# Patient Record
Sex: Male | Born: 1935
Health system: Southern US, Community
[De-identification: ages and names within clinical notes are randomized; demographics above are authoritative.]

## PROBLEM LIST (undated history)

## (undated) DIAGNOSIS — E785 Hyperlipidemia, unspecified: Secondary | ICD-10-CM

## (undated) DIAGNOSIS — H3320 Serous retinal detachment, unspecified eye: Secondary | ICD-10-CM

## (undated) DIAGNOSIS — J61 Pneumoconiosis due to asbestos and other mineral fibers: Secondary | ICD-10-CM

## (undated) DIAGNOSIS — I10 Essential (primary) hypertension: Secondary | ICD-10-CM

## (undated) DIAGNOSIS — T4145XA Adverse effect of unspecified anesthetic, initial encounter: Secondary | ICD-10-CM

## (undated) DIAGNOSIS — T8859XA Other complications of anesthesia, initial encounter: Secondary | ICD-10-CM

## (undated) DIAGNOSIS — I739 Peripheral vascular disease, unspecified: Secondary | ICD-10-CM

## (undated) DIAGNOSIS — I639 Cerebral infarction, unspecified: Secondary | ICD-10-CM

## (undated) DIAGNOSIS — R918 Other nonspecific abnormal finding of lung field: Secondary | ICD-10-CM

## (undated) DIAGNOSIS — D6851 Activated protein C resistance: Secondary | ICD-10-CM

## (undated) DIAGNOSIS — K922 Gastrointestinal hemorrhage, unspecified: Secondary | ICD-10-CM

## (undated) DIAGNOSIS — Z85118 Personal history of other malignant neoplasm of bronchus and lung: Secondary | ICD-10-CM

## (undated) DIAGNOSIS — Z951 Presence of aortocoronary bypass graft: Secondary | ICD-10-CM

## (undated) DIAGNOSIS — E039 Hypothyroidism, unspecified: Secondary | ICD-10-CM

## (undated) DIAGNOSIS — IMO0001 Reserved for inherently not codable concepts without codable children: Secondary | ICD-10-CM

## (undated) DIAGNOSIS — I69391 Dysphagia following cerebral infarction: Secondary | ICD-10-CM

## (undated) DIAGNOSIS — I779 Disorder of arteries and arterioles, unspecified: Secondary | ICD-10-CM

## (undated) DIAGNOSIS — J45909 Unspecified asthma, uncomplicated: Secondary | ICD-10-CM

## (undated) DIAGNOSIS — I2510 Atherosclerotic heart disease of native coronary artery without angina pectoris: Secondary | ICD-10-CM

## (undated) DIAGNOSIS — IMO0002 Reserved for concepts with insufficient information to code with codable children: Secondary | ICD-10-CM

## (undated) DIAGNOSIS — H269 Unspecified cataract: Secondary | ICD-10-CM

## (undated) DIAGNOSIS — J439 Emphysema, unspecified: Secondary | ICD-10-CM

## (undated) DIAGNOSIS — C3490 Malignant neoplasm of unspecified part of unspecified bronchus or lung: Secondary | ICD-10-CM

## (undated) HISTORY — DX: Pneumoconiosis due to asbestos and other mineral fibers: J61

## (undated) HISTORY — DX: Personal history of other malignant neoplasm of bronchus and lung: Z85.118

## (undated) HISTORY — DX: Peripheral vascular disease, unspecified: I73.9

## (undated) HISTORY — DX: Reserved for inherently not codable concepts without codable children: IMO0001

## (undated) HISTORY — DX: Atherosclerotic heart disease of native coronary artery without angina pectoris: I25.10

## (undated) HISTORY — PX: HEMORRHOID SURGERY: SHX153

## (undated) HISTORY — DX: Other nonspecific abnormal finding of lung field: R91.8

## (undated) HISTORY — PX: APPENDECTOMY: SHX54

## (undated) HISTORY — DX: Malignant neoplasm of unspecified part of unspecified bronchus or lung: C34.90

## (undated) HISTORY — DX: Essential (primary) hypertension: I10

## (undated) HISTORY — DX: Unspecified cataract: H26.9

## (undated) HISTORY — DX: Emphysema, unspecified: J43.9

## (undated) HISTORY — PX: FRACTURE SURGERY: SHX138

## (undated) HISTORY — DX: Disorder of arteries and arterioles, unspecified: I77.9

## (undated) HISTORY — DX: Dysphagia following cerebral infarction: I69.391

## (undated) HISTORY — DX: Cerebral infarction, unspecified: I63.9

## (undated) HISTORY — DX: Hyperlipidemia, unspecified: E78.5

## (undated) HISTORY — PX: PROSTATE SURGERY: SHX751

## (undated) HISTORY — DX: Serous retinal detachment, unspecified eye: H33.20

## (undated) HISTORY — DX: Reserved for concepts with insufficient information to code with codable children: IMO0002

---

## 1997-05-14 DIAGNOSIS — I2510 Atherosclerotic heart disease of native coronary artery without angina pectoris: Secondary | ICD-10-CM

## 1997-05-14 HISTORY — PX: PERCUTANEOUS CORONARY STENT INTERVENTION (PCI-S): SHX6016

## 1997-05-14 HISTORY — DX: Atherosclerotic heart disease of native coronary artery without angina pectoris: I25.10

## 1997-07-28 ENCOUNTER — Encounter (HOSPITAL_COMMUNITY): Admission: RE | Admit: 1997-07-28 | Discharge: 1997-10-26 | Payer: Self-pay | Admitting: *Deleted

## 1997-10-13 ENCOUNTER — Inpatient Hospital Stay (HOSPITAL_COMMUNITY): Admission: AD | Admit: 1997-10-13 | Discharge: 1997-10-14 | Payer: Self-pay | Admitting: Cardiology

## 1998-09-20 ENCOUNTER — Encounter: Payer: Self-pay | Admitting: Cardiology

## 1998-09-20 ENCOUNTER — Observation Stay (HOSPITAL_COMMUNITY): Admission: AD | Admit: 1998-09-20 | Discharge: 1998-09-21 | Payer: Self-pay | Admitting: Cardiology

## 1999-05-15 DIAGNOSIS — C3490 Malignant neoplasm of unspecified part of unspecified bronchus or lung: Secondary | ICD-10-CM

## 1999-05-15 HISTORY — DX: Malignant neoplasm of unspecified part of unspecified bronchus or lung: C34.90

## 1999-05-25 ENCOUNTER — Ambulatory Visit: Admission: RE | Admit: 1999-05-25 | Discharge: 1999-05-25 | Payer: Self-pay | Admitting: Pulmonary Disease

## 1999-06-08 ENCOUNTER — Encounter: Payer: Self-pay | Admitting: Thoracic Surgery

## 1999-06-09 ENCOUNTER — Encounter: Payer: Self-pay | Admitting: Thoracic Surgery

## 1999-06-09 ENCOUNTER — Encounter (INDEPENDENT_AMBULATORY_CARE_PROVIDER_SITE_OTHER): Payer: Self-pay | Admitting: *Deleted

## 1999-06-09 ENCOUNTER — Inpatient Hospital Stay (HOSPITAL_COMMUNITY): Admission: RE | Admit: 1999-06-09 | Discharge: 1999-06-16 | Payer: Self-pay | Admitting: Thoracic Surgery

## 1999-06-09 DIAGNOSIS — Z85118 Personal history of other malignant neoplasm of bronchus and lung: Secondary | ICD-10-CM

## 1999-06-09 HISTORY — DX: Personal history of other malignant neoplasm of bronchus and lung: Z85.118

## 1999-06-09 HISTORY — PX: LOBECTOMY: SHX5089

## 1999-06-10 ENCOUNTER — Encounter: Payer: Self-pay | Admitting: Thoracic Surgery

## 1999-06-11 ENCOUNTER — Encounter: Payer: Self-pay | Admitting: Thoracic Surgery

## 1999-06-12 ENCOUNTER — Encounter: Payer: Self-pay | Admitting: Thoracic Surgery

## 1999-06-13 ENCOUNTER — Encounter: Payer: Self-pay | Admitting: Thoracic Surgery

## 1999-06-14 ENCOUNTER — Encounter: Payer: Self-pay | Admitting: Thoracic Surgery

## 1999-06-15 ENCOUNTER — Encounter: Payer: Self-pay | Admitting: Thoracic Surgery

## 1999-06-16 ENCOUNTER — Encounter: Payer: Self-pay | Admitting: Thoracic Surgery

## 1999-06-20 ENCOUNTER — Encounter: Payer: Self-pay | Admitting: Thoracic Surgery

## 1999-06-20 ENCOUNTER — Encounter: Admission: RE | Admit: 1999-06-20 | Discharge: 1999-06-20 | Payer: Self-pay | Admitting: Thoracic Surgery

## 1999-06-27 ENCOUNTER — Encounter: Payer: Self-pay | Admitting: Thoracic Surgery

## 1999-06-27 ENCOUNTER — Encounter: Admission: RE | Admit: 1999-06-27 | Discharge: 1999-06-27 | Payer: Self-pay | Admitting: Thoracic Surgery

## 1999-06-30 ENCOUNTER — Encounter: Payer: Self-pay | Admitting: Thoracic Surgery

## 1999-06-30 ENCOUNTER — Encounter: Admission: RE | Admit: 1999-06-30 | Discharge: 1999-06-30 | Payer: Self-pay | Admitting: Thoracic Surgery

## 1999-07-02 ENCOUNTER — Inpatient Hospital Stay (HOSPITAL_COMMUNITY): Admission: AD | Admit: 1999-07-02 | Discharge: 1999-07-20 | Payer: Self-pay | Admitting: Cardiothoracic Surgery

## 1999-07-03 ENCOUNTER — Encounter: Payer: Self-pay | Admitting: Cardiothoracic Surgery

## 1999-07-04 ENCOUNTER — Encounter: Payer: Self-pay | Admitting: Thoracic Surgery

## 1999-07-05 ENCOUNTER — Encounter: Payer: Self-pay | Admitting: Thoracic Surgery

## 1999-07-06 ENCOUNTER — Inpatient Hospital Stay
Admission: RE | Admit: 1999-07-06 | Discharge: 1999-07-06 | Payer: Self-pay | Admitting: Physical Medicine & Rehabilitation

## 1999-07-07 ENCOUNTER — Encounter: Payer: Self-pay | Admitting: Thoracic Surgery

## 1999-07-09 ENCOUNTER — Encounter: Payer: Self-pay | Admitting: Thoracic Surgery

## 1999-07-11 ENCOUNTER — Encounter: Payer: Self-pay | Admitting: Thoracic Surgery

## 1999-07-13 ENCOUNTER — Encounter: Payer: Self-pay | Admitting: Thoracic Surgery

## 1999-07-13 HISTORY — PX: VIDEO ASSISTED THORACOSCOPY (VATS)/EMPYEMA: SHX6172

## 1999-07-14 ENCOUNTER — Encounter: Payer: Self-pay | Admitting: Thoracic Surgery

## 1999-07-15 ENCOUNTER — Encounter: Payer: Self-pay | Admitting: Thoracic Surgery

## 1999-07-16 ENCOUNTER — Encounter: Payer: Self-pay | Admitting: Thoracic Surgery

## 1999-07-17 ENCOUNTER — Encounter: Payer: Self-pay | Admitting: Thoracic Surgery

## 1999-07-18 ENCOUNTER — Encounter: Payer: Self-pay | Admitting: Thoracic Surgery

## 1999-07-20 ENCOUNTER — Encounter: Payer: Self-pay | Admitting: Thoracic Surgery

## 1999-07-26 ENCOUNTER — Encounter: Admission: RE | Admit: 1999-07-26 | Discharge: 1999-07-26 | Payer: Self-pay | Admitting: Thoracic Surgery

## 1999-07-26 ENCOUNTER — Encounter: Payer: Self-pay | Admitting: Thoracic Surgery

## 1999-08-11 ENCOUNTER — Encounter: Admission: RE | Admit: 1999-08-11 | Discharge: 1999-08-11 | Payer: Self-pay | Admitting: Thoracic Surgery

## 1999-08-11 ENCOUNTER — Encounter: Payer: Self-pay | Admitting: Thoracic Surgery

## 1999-09-08 ENCOUNTER — Encounter: Admission: RE | Admit: 1999-09-08 | Discharge: 1999-09-08 | Payer: Self-pay | Admitting: Thoracic Surgery

## 1999-09-08 ENCOUNTER — Encounter: Payer: Self-pay | Admitting: Thoracic Surgery

## 1999-09-22 ENCOUNTER — Encounter: Admission: RE | Admit: 1999-09-22 | Discharge: 1999-09-22 | Payer: Self-pay | Admitting: Thoracic Surgery

## 1999-09-22 ENCOUNTER — Encounter: Payer: Self-pay | Admitting: Thoracic Surgery

## 1999-10-13 ENCOUNTER — Encounter: Admission: RE | Admit: 1999-10-13 | Discharge: 1999-10-13 | Payer: Self-pay | Admitting: Thoracic Surgery

## 1999-10-13 ENCOUNTER — Encounter: Payer: Self-pay | Admitting: Thoracic Surgery

## 1999-11-14 ENCOUNTER — Encounter: Payer: Self-pay | Admitting: Thoracic Surgery

## 1999-11-14 ENCOUNTER — Encounter: Admission: RE | Admit: 1999-11-14 | Discharge: 1999-11-14 | Payer: Self-pay | Admitting: Thoracic Surgery

## 2000-01-17 ENCOUNTER — Encounter: Payer: Self-pay | Admitting: Thoracic Surgery

## 2000-01-17 ENCOUNTER — Encounter: Admission: RE | Admit: 2000-01-17 | Discharge: 2000-01-17 | Payer: Self-pay | Admitting: Thoracic Surgery

## 2000-04-19 ENCOUNTER — Encounter: Payer: Self-pay | Admitting: Thoracic Surgery

## 2000-04-19 ENCOUNTER — Encounter: Admission: RE | Admit: 2000-04-19 | Discharge: 2000-04-19 | Payer: Self-pay | Admitting: Thoracic Surgery

## 2000-07-19 ENCOUNTER — Encounter: Admission: RE | Admit: 2000-07-19 | Discharge: 2000-07-19 | Payer: Self-pay | Admitting: Thoracic Surgery

## 2000-07-19 ENCOUNTER — Encounter: Payer: Self-pay | Admitting: Thoracic Surgery

## 2000-11-27 ENCOUNTER — Encounter: Payer: Self-pay | Admitting: Thoracic Surgery

## 2000-11-27 ENCOUNTER — Encounter: Admission: RE | Admit: 2000-11-27 | Discharge: 2000-11-27 | Payer: Self-pay | Admitting: Thoracic Surgery

## 2001-05-30 ENCOUNTER — Encounter: Admission: RE | Admit: 2001-05-30 | Discharge: 2001-05-30 | Payer: Self-pay | Admitting: Thoracic Surgery

## 2001-05-30 ENCOUNTER — Encounter: Payer: Self-pay | Admitting: Thoracic Surgery

## 2001-11-26 ENCOUNTER — Encounter: Admission: RE | Admit: 2001-11-26 | Discharge: 2001-11-26 | Payer: Self-pay | Admitting: Thoracic Surgery

## 2001-11-26 ENCOUNTER — Encounter: Payer: Self-pay | Admitting: Thoracic Surgery

## 2002-05-29 ENCOUNTER — Encounter: Admission: RE | Admit: 2002-05-29 | Discharge: 2002-05-29 | Payer: Self-pay | Admitting: Thoracic Surgery

## 2002-05-29 ENCOUNTER — Encounter: Payer: Self-pay | Admitting: Thoracic Surgery

## 2002-06-24 ENCOUNTER — Encounter: Payer: Self-pay | Admitting: Cardiology

## 2002-06-24 ENCOUNTER — Inpatient Hospital Stay (HOSPITAL_COMMUNITY): Admission: AD | Admit: 2002-06-24 | Discharge: 2002-06-26 | Payer: Self-pay | Admitting: Cardiology

## 2002-06-25 ENCOUNTER — Encounter: Payer: Self-pay | Admitting: Cardiology

## 2002-12-03 ENCOUNTER — Encounter: Payer: Self-pay | Admitting: Thoracic Surgery

## 2002-12-03 ENCOUNTER — Encounter: Admission: RE | Admit: 2002-12-03 | Discharge: 2002-12-03 | Payer: Self-pay | Admitting: Thoracic Surgery

## 2003-06-15 ENCOUNTER — Encounter: Admission: RE | Admit: 2003-06-15 | Discharge: 2003-06-15 | Payer: Self-pay | Admitting: Thoracic Surgery

## 2003-09-17 ENCOUNTER — Encounter (INDEPENDENT_AMBULATORY_CARE_PROVIDER_SITE_OTHER): Payer: Self-pay | Admitting: Specialist

## 2003-09-17 ENCOUNTER — Ambulatory Visit (HOSPITAL_COMMUNITY): Admission: RE | Admit: 2003-09-17 | Discharge: 2003-09-17 | Payer: Self-pay | Admitting: Thoracic Surgery

## 2003-10-06 ENCOUNTER — Encounter: Admission: RE | Admit: 2003-10-06 | Discharge: 2003-10-06 | Payer: Self-pay | Admitting: Thoracic Surgery

## 2004-03-31 ENCOUNTER — Ambulatory Visit: Payer: Self-pay | Admitting: Oncology

## 2004-05-23 ENCOUNTER — Encounter: Admission: RE | Admit: 2004-05-23 | Discharge: 2004-05-23 | Payer: Self-pay | Admitting: Thoracic Surgery

## 2004-07-07 ENCOUNTER — Ambulatory Visit: Payer: Self-pay | Admitting: Oncology

## 2004-09-28 ENCOUNTER — Ambulatory Visit: Payer: Self-pay | Admitting: Oncology

## 2005-02-07 ENCOUNTER — Ambulatory Visit: Payer: Self-pay | Admitting: Oncology

## 2005-02-21 ENCOUNTER — Encounter: Admission: RE | Admit: 2005-02-21 | Discharge: 2005-02-21 | Payer: Self-pay | Admitting: Thoracic Surgery

## 2005-07-25 ENCOUNTER — Ambulatory Visit: Payer: Self-pay | Admitting: Oncology

## 2006-01-23 ENCOUNTER — Ambulatory Visit: Payer: Self-pay | Admitting: Oncology

## 2006-07-31 ENCOUNTER — Ambulatory Visit: Payer: Self-pay | Admitting: Oncology

## 2009-02-16 ENCOUNTER — Encounter: Payer: Self-pay | Admitting: Internal Medicine

## 2009-03-14 ENCOUNTER — Encounter: Payer: Self-pay | Admitting: Cardiology

## 2009-05-14 DIAGNOSIS — I739 Peripheral vascular disease, unspecified: Secondary | ICD-10-CM

## 2009-05-14 HISTORY — PX: CAROTID ENDARTERECTOMY: SUR193

## 2009-05-14 HISTORY — PX: ILIAC ARTERY STENT: SHX1786

## 2009-05-14 HISTORY — DX: Peripheral vascular disease, unspecified: I73.9

## 2009-07-12 DIAGNOSIS — I639 Cerebral infarction, unspecified: Secondary | ICD-10-CM

## 2009-07-12 HISTORY — DX: Cerebral infarction, unspecified: I63.9

## 2009-12-29 ENCOUNTER — Encounter: Payer: Self-pay | Admitting: Cardiology

## 2010-01-13 ENCOUNTER — Encounter (INDEPENDENT_AMBULATORY_CARE_PROVIDER_SITE_OTHER): Payer: Self-pay | Admitting: *Deleted

## 2010-01-13 ENCOUNTER — Ambulatory Visit: Payer: Self-pay | Admitting: Cardiology

## 2010-01-13 DIAGNOSIS — I739 Peripheral vascular disease, unspecified: Secondary | ICD-10-CM | POA: Insufficient documentation

## 2010-01-13 DIAGNOSIS — I10 Essential (primary) hypertension: Secondary | ICD-10-CM

## 2010-01-13 DIAGNOSIS — E785 Hyperlipidemia, unspecified: Secondary | ICD-10-CM | POA: Insufficient documentation

## 2010-01-13 DIAGNOSIS — J439 Emphysema, unspecified: Secondary | ICD-10-CM | POA: Insufficient documentation

## 2010-01-13 DIAGNOSIS — I2510 Atherosclerotic heart disease of native coronary artery without angina pectoris: Secondary | ICD-10-CM | POA: Insufficient documentation

## 2010-01-13 HISTORY — DX: Peripheral vascular disease, unspecified: I73.9

## 2010-01-13 HISTORY — DX: Essential (primary) hypertension: I10

## 2010-01-13 HISTORY — DX: Emphysema, unspecified: J43.9

## 2010-01-13 HISTORY — DX: Hyperlipidemia, unspecified: E78.5

## 2010-01-13 LAB — CONVERTED CEMR LAB
BUN: 22 mg/dL (ref 6–23)
Basophils Relative: 0 % (ref 0.0–3.0)
Calcium: 9.3 mg/dL (ref 8.4–10.5)
Creatinine, Ser: 1.1 mg/dL (ref 0.4–1.5)
Eosinophils Absolute: 0 10*3/uL (ref 0.0–0.7)
Eosinophils Relative: 0 % (ref 0.0–5.0)
Glucose, Bld: 125 mg/dL — ABNORMAL HIGH (ref 70–99)
HCT: 29.1 % — ABNORMAL LOW (ref 39.0–52.0)
Hemoglobin: 9.3 g/dL — ABNORMAL LOW (ref 13.0–17.0)
INR: 1.4 — ABNORMAL HIGH (ref 0.8–1.0)
Lymphs Abs: 1.3 10*3/uL (ref 0.7–4.0)
MCHC: 32.1 g/dL (ref 30.0–36.0)
MCV: 71.5 fL — ABNORMAL LOW (ref 78.0–100.0)
Monocytes Absolute: 0.3 10*3/uL (ref 0.1–1.0)
Platelets: 191 10*3/uL (ref 150.0–400.0)
RBC: 4.07 M/uL — ABNORMAL LOW (ref 4.22–5.81)
RDW: 20 % — ABNORMAL HIGH (ref 11.5–14.6)
WBC: 8.4 10*3/uL (ref 4.5–10.5)
aPTT: 23.8 s (ref 21.7–28.8)

## 2010-01-17 ENCOUNTER — Telehealth: Payer: Self-pay | Admitting: Cardiology

## 2010-01-17 ENCOUNTER — Encounter: Payer: Self-pay | Admitting: Cardiology

## 2010-01-18 ENCOUNTER — Ambulatory Visit: Payer: Self-pay | Admitting: Cardiology

## 2010-01-18 ENCOUNTER — Ambulatory Visit (HOSPITAL_COMMUNITY): Admission: RE | Admit: 2010-01-18 | Discharge: 2010-01-18 | Payer: Self-pay | Admitting: Cardiology

## 2010-06-04 ENCOUNTER — Encounter: Payer: Self-pay | Admitting: Thoracic Surgery

## 2010-06-15 NOTE — Cardiovascular Report (Signed)
Summary: Cardiac Cath   Cardiac Cath   Imported By: Marylou Mccoy 01/12/2010 18:22:36  _____________________________________________________________________  External Attachment:    Type:   Image     Comment:   External Document

## 2010-06-15 NOTE — Cardiovascular Report (Signed)
Summary: Cath/Percutaneous Orders  Cath/Percutaneous Orders   Imported By: Roderic Ovens 01/24/2010 16:02:05  _____________________________________________________________________  External Attachment:    Type:   Image     Comment:   External Document

## 2010-06-15 NOTE — Progress Notes (Signed)
Summary: cath in Main Lab  ---- Converted from flag ---- ---- 01/14/2010 11:17 AM, Lenoria Farrier, MD, [facility] wrote: Dewayne Hatch, I think we rescheduled Mr Burrough's cath in [facility] when we discovered his anemia.  Herbert Seta found out he had colon 1 month ago which was OK so we should do him in upstairs lab Wednesday.  Can you arrange. Thx. BB ------------------------------  Appended Document: reschedule cath to [facility] I talked with Casimiro Needle in [facility] --rescheduled to 01/18/10 at 11:30  Appended Document: reschedule cath to [facility] pt to be done in Main Lab at 11:30 pt to arrive at 9:30  Appended Document: reschedule cath to [facility] pt aware cath will be in the main lab 01/18/10  to arrive at 9:30 cath to be done at 11:30  Appended Document: cath in Main Lab per Dr Garret Reddish time is 2:30pm in main lab--pt to arrive at 12:30pm --pt aware of above --I talked with Nesi in the main lab with the time change

## 2010-06-15 NOTE — Letter (Signed)
Summary: [facility] - Operative Report  [facility] - Operative Report   Imported By: Marylou Mccoy 01/12/2010 18:18:10  _____________________________________________________________________  External Attachment:    Type:   Image     Comment:   External Document

## 2010-06-15 NOTE — Letter (Signed)
Summary: Cardiac Catheterization Instructions- Main Lab  [employer], Main Office  [address]   [place], [place] [address]   Phone: [phone]  Fax: [phone]     01/13/2010 MRN: [id number]  Glenn Reilly [address] [place], [place]  [address]  Dear Glenn Reilly,   You are scheduled for Cardiac Catheterization on  Wed. 01/18/10            with Dr.Brodie.  Please arrive at the [facility] at 9:30       a.m. on the day of your procedure.  1. DIET     _x___ Nothing to eat or drink after midnight except your medications with a sip of water.  2. Come to the [place] office on   (01/13/10)          for lab work.  The lab at [facility] is open from 8:30 a.m. to 1:30 p.m. and 2:30 p.m. to 5:00 p.m.  The lab at [facility] is open from 7:30 a.m. to 5:30 p.m.  You do not have to be fasting.  3. MAKE SURE YOU TAKE YOUR ASPIRIN.  4. _____ DO NOT TAKE these medications before your procedure:         ________________________________________________________________________________      _x___ YOU MAY TAKE ALL of your remaining medications with a small amount of water.      ____ START NEW medications:     ________________________________________________________________________________      ____ Eilene Ghazi instructions:     ________________________________________________________________________________  5. Plan for one night stay - bring personal belongings (i.e. toothpaste, toothbrush, etc.)  6. Bring a current list of your medications and current insurance cards.  7. Must have a responsible person to drive you home.   8. Someone must be with you for the first 24 hours after you arrive home.  9. Please wear clothes that are easy to get on and off and wear slip-on shoes.  *Special note: Every effort is made to have your procedure done on time.  Occasionally there are emergencies that present themselves at the  hospital that may cause delays.  Please be patient if a delay does occur.  If you have any questions after you get home, please call the office at the number listed above.  Sherri Rad, RN, BSN

## 2010-06-15 NOTE — Letter (Signed)
Summary: [facility] - Stress  [facility] - Stress   Imported By: Marylou Mccoy 01/12/2010 17:38:34  _____________________________________________________________________  External Attachment:    Type:   Image     Comment:   External Document

## 2010-06-15 NOTE — Assessment & Plan Note (Signed)
Summary: np6   Visit Type:  Initial Consult Primary Shia Eber:  Dr Kennieth Rad  CC:  chestpain.  History of Present Illness: The patient is 77 years old and is seen today as a new consultation for evaluation of exertional shorttness of breath. He is followed by Dr.Dhatt in [place].  He has been having shortness of breath with exertion for about 3 months. He does have chronic pulmonary disease but the shortness of breath is different than what he has experienced with his pulmonary disease. He had a dobutamine sestamibi scan performed in [place] which showed an ejection fraction of 58% and no evidence of ischemia but he did have ECG changes.  He has known coronary disease and had a stent placed in the right coronary in the remote past and underwent catheterization in 2002-10-08 at which time his stent was patent and there was nonobstructive CAD.  He has also had stents placed to both iliac arteries. He had a recent left carotid antrectomy in December of 2012 in [place].  Current Medications (verified): 1)  Plavix 75 Mg Tabs (Clopidogrel Bisulfate) .... Take One Tablet By Mouth Daily 2)  Aspir-Low 81 Mg Tbec (Aspirin) .Marland Kitchen.. 1 Tab Once Daily 3)  Ventolin .... Prn 4)  Levothroid 50 Mcg Tabs (Levothyroxine Sodium) .Marland Kitchen.. 1 Tab Qd 5)  Prednisone 10 Mg Tabs (Prednisone) .... As Directed  Dose Pack 6)  Crestor 20 Mg Tabs (Rosuvastatin Calcium) .Marland Kitchen.. 1tab Qd 7)  Carvedilol 12.5 Mg Tabs (Carvedilol) .... Take One Tablet By Mouth Twice A Day 8)  Niacin 500 Mg Tabs (Niacin) .Marland Kitchen.. 1 Tab Bid 9)  Ipratropium Bromide Treatmet .... Nebulizer  Allergies (verified): 1)  ! Demerol  Past History:  Past Medical History: Reviewed history from 01/12/2010 and no changes required.  PAST MEDICAL HISTORY:  1. Coronary artery disease, as above.  2. Hypothyroidism, replaced.  3. Hyperlipidemia, treated.  4. Empyema of the right lung, status post right lower lobectomy with stage I     large cell undifferentiated carcinoma  (Dr. Karle Plumber).  He was seen     by Dr. Cephas Darby for his carcinoma and in 10/08/99 underwent special     hematology tests that revealed a normal factor II and a factor X level     that was slightly decreased at 41%.  He does have inhibitory activity     against factor V but was not felt to be clinically significant but was     felt to be a lupus anticoagulant.  5. History of asthma.  6. Hypertension.  Family History: Reviewed history from 01/12/2010 and no changes required. Is remarkable for his son having asthma and allergies.His brother,mother,and father have all heart disease.He has 2 brothers with cancer.  Social History: Reviewed history from 01/12/2010 and no changes required.   The patient is retired.He has history of smoking 1 pack per day 20 years. He has not smoked in 30 years. His wife passed away in Oct 08, 2002.  Review of Systems       ROS is negative except as outlined in HPI.   Vital Signs:  Patient profile:   77 year old male Height:      68 inches Weight:      157 pounds BMI:     23.96 Pulse rate:   64 / minute BP sitting:   180 / 72  (left arm)  Vitals Entered By: Burnett Kanaris, CNA (January 13, 2010 10:39 AM)  Physical Exam  Additional Exam:  Gen.  Well-nourished, in no distress   Neck: No JVD, thyroid not enlarged, left carotid endarterectomy scar and right carotid bruit Lungs: No tachypnea, clear without rales, rhonchi or wheezes Cardiovascular: Rhythm regular, PMI not displaced,  heart sounds  normal, no murmurs or gallops, no peripheral edema, pulses normal in all 4 extremities. Abdomen: BS normal, abdomen soft and non-tender without masses or organomegaly, no hepatosplenomegaly,loud abdominal bruit MS: No deformities, no cyanosis or clubbing   Neuro:  No focal sns   Skin:  no lesions    Impression & Recommendations:  Problem # 1:  DYSPNEA (ICD-786.05)  He has dyspnea on exertion which is new but is not associated with chest pain. He had a  dobutamine heart scan which did not show definite ischemia but did show ECG changes. Am very suspicious that his symptoms are related to ischemia we'll plan further evaluation with catheterization. We'll plan to do this in the upstairs laboratory so we can plan intervention if needed. The following medications were removed from the medication list:    Uniretic 15-25 Mg Tabs (Moexipril-hydrochlorothiazide) .Marland Kitchen... 1/2 tab once daily His updated medication list for this problem includes:    Aspir-low 81 Mg Tbec (Aspirin) .Marland Kitchen... 1 tab once daily    Carvedilol 12.5 Mg Tabs (Carvedilol) .Marland Kitchen... Take one tablet by mouth twice a day  The following medications were removed from the medication list:    Uniretic 15-25 Mg Tabs (Moexipril-hydrochlorothiazide) .Marland Kitchen... 1/2 tab once daily His updated medication list for this problem includes:    Aspir-low 81 Mg Tbec (Aspirin) .Marland Kitchen... 1 tab once daily    Carvedilol 12.5 Mg Tabs (Carvedilol) .Marland Kitchen... Take one tablet by mouth twice a day  Orders: Cardiac Catheterization (Cardiac Cath) TLB-BMP (Basic Metabolic Panel-BMET) (80048-METABOL) TLB-CBC Platelet - w/Differential (85025-CBCD) TLB-PT (Protime) (85610-PTP) TLB-PTT (85730-PTTL) EKG w/ Interpretation (93000) T-2 View CXR (71020TC)  Problem # 2:  CAD, NATIVE VESSEL (ICD-414.01)  He has documented CAD with a previous stent placed many years ago and nonobstructive disease at catheterization in 2004. We plan further evaluation of his dyspnea as described above. The following medications were removed from the medication list:    Uniretic 15-25 Mg Tabs (Moexipril-hydrochlorothiazide) .Marland Kitchen... 1/2 tab once daily His updated medication list for this problem includes:    Plavix 75 Mg Tabs (Clopidogrel bisulfate) .Marland Kitchen... Take one tablet by mouth daily    Aspir-low 81 Mg Tbec (Aspirin) .Marland Kitchen... 1 tab once daily    Carvedilol 12.5 Mg Tabs (Carvedilol) .Marland Kitchen... Take one tablet by mouth twice a day  The following medications were  removed from the medication list:    Uniretic 15-25 Mg Tabs (Moexipril-hydrochlorothiazide) .Marland Kitchen... 1/2 tab once daily His updated medication list for this problem includes:    Plavix 75 Mg Tabs (Clopidogrel bisulfate) .Marland Kitchen... Take one tablet by mouth daily    Aspir-low 81 Mg Tbec (Aspirin) .Marland Kitchen... 1 tab once daily    Carvedilol 12.5 Mg Tabs (Carvedilol) .Marland Kitchen... Take one tablet by mouth twice a day  Orders: Cardiac Catheterization (Cardiac Cath) TLB-BMP (Basic Metabolic Panel-BMET) (80048-METABOL) TLB-CBC Platelet - w/Differential (85025-CBCD) TLB-PT (Protime) (85610-PTP) TLB-PTT (85730-PTTL) EKG w/ Interpretation (93000) T-2 View CXR (71020TC)  Problem # 3:  PVD (ICD-443.9) He has stents in both legs and has recent carotid endarterectomy. These problems appear stable.  Problem # 4:  HYPERTENSION, BENIGN (ICD-401.1) His blood pressure is elevated today but he says this happens when he goes to the doctor's office. He says it runs 120 systolic at home. The following medications were removed  from the medication list:    Uniretic 15-25 Mg Tabs (Moexipril-hydrochlorothiazide) .Marland Kitchen... 1/2 tab once daily His updated medication list for this problem includes:    Aspir-low 81 Mg Tbec (Aspirin) .Marland Kitchen... 1 tab once daily    Carvedilol 12.5 Mg Tabs (Carvedilol) .Marland Kitchen... Take one tablet by mouth twice a day  The following medications were removed from the medication list:    Uniretic 15-25 Mg Tabs (Moexipril-hydrochlorothiazide) .Marland Kitchen... 1/2 tab once daily His updated medication list for this problem includes:    Aspir-low 81 Mg Tbec (Aspirin) .Marland Kitchen... 1 tab once daily    Carvedilol 12.5 Mg Tabs (Carvedilol) .Marland Kitchen... Take one tablet by mouth twice a day  Patient Instructions: 1)  Your physician recommends that you have lab work today: bmet/cbc/pt/ptt (786.50;414.01) 2)  Your physician has requested that you have a cardiac catheterization.  Cardiac catheterization is used to diagnose and/or treat various heart  conditions. Doctors may recommend this procedure for a number of different reasons. The most common reason is to evaluate chest pain. Chest pain can be a symptom of coronary artery disease (CAD), and cardiac catheterization can show whether plaque is narrowing or blocking your heart's arteries. This procedure is also used to evaluate the valves, as well as measure the blood flow and oxygen levels in different parts of your heart.  For further information please visit [email or web address]  Please follow instruction sheet, as given. 3)  Your physician recommends that you continue on your current medications as directed. Please refer to the Current Medication list given to you today. 4)  A chest x-ray takes a picture of the organs and structures inside the chest, including the heart, lungs, and blood vessels. This test can show several things, including, whether the heart is enlarged; whether fluid is building up in the lungs; and whether pacemaker / defibrillator leads are still in place.

## 2010-07-27 LAB — POCT I-STAT 3, VENOUS BLOOD GAS (G3P V)
Acid-base deficit: 4 mmol/L — ABNORMAL HIGH (ref 0.0–2.0)
Bicarbonate: 20.8 meq/L (ref 20.0–24.0)
O2 Saturation: 56 %
O2 Saturation: 96 %
TCO2: 22 mmol/L (ref 0–100)
TCO2: 26 mmol/L (ref 0–100)
pCO2, Ven: 34 mmHg — ABNORMAL LOW (ref 45.0–50.0)
pH, Ven: 7.396 — ABNORMAL HIGH (ref 7.250–7.300)
pO2, Ven: 79 mmHg — ABNORMAL HIGH (ref 30.0–45.0)

## 2010-07-27 LAB — POCT I-STAT 3, ART BLOOD GAS (G3+)
Acid-base deficit: 1 mmol/L (ref 0.0–2.0)
Bicarbonate: 24 meq/L (ref 20.0–24.0)
O2 Saturation: 96 %
TCO2: 25 mmol/L (ref 0–100)
pCO2 arterial: 38.8 mmHg (ref 35.0–45.0)
pH, Arterial: 7.4 (ref 7.350–7.450)
pO2, Arterial: 84 mmHg (ref 80.0–100.0)

## 2010-07-27 LAB — D-DIMER, QUANTITATIVE

## 2010-09-29 NOTE — Discharge Summary (Signed)
[place]. [facility]  Patient:    ERCEL, PEPITONE                    MRN: [id number] Adm. Date:  [id number] Disc. Date: [id number] Attending:  Cameron Proud Dictator:   Eugenia Pancoast, P.A. CC:         Norton Blizzard, M.D.             Barbaraann Share, M.D. [facility]             Malkiat Dhatt, M.D.             Genene Churn. Cyndie Chime, M.D.                           Discharge Summary  DATE OF BIRTH:  03/22/1929  FINAL DIAGNOSES: 1. Empyema right lung. 2. Chronic obstructive pulmonary disease. 3. Asthma. 4. Hypertension. 5. Coronary artery disease. 6. Status post percutaneous transluminal coronary angioplasty and stent placement. 7. Hypothyroid.  PROCEDURES:  July 13, 1999:  Right VATS with pleural peel and cultures of pleural fluid; surgeon Dr. Edwyna Shell.  HISTORY:  This is a 77 year old gentleman who was admitted for fever.  The fever was possibly coming from an empyema, status post right thoracotomy on June 09, 1999.  The patient underwent a right lower lobectomy at that time with a diagnosis of stage I large cell undifferentiated carcinoma.  The patient has ad a fever up to 101 degrees.  He was treated with p.o. Keflex and Zithromax.  He as seen in the [facility] office on June 30, 1999 and a chest x-ray was read. According to Dr. Edwyna Shell, his is well-healed.  He had no productive cough.  He subsequently presented as a transfer from [facility] with a white count of 12,500 and positive bands.  Blood cultures were drawn at [facility] and showed no growth so far.  The patient was being admitted for IV antibiotic therapy and to diagnose he cause.  HOSPITAL COURSE:  The patient was admitted and started on IV vancomycin.  He noted a pro time on July 03, 1999 of 2.0 and the patient was not on Coumadin at this time.  This was unexplained.  He continued his hospital stay.  He continued on is IV vancomycin.  On July 04, 1999, the patient underwent radiological placement under CT-guidance of a drainage catheter.  Fluid was sent for culture at that time. Cultures were negative.  He continued in the hospital and Dr. Edwyna Shell decided he  should undergo a right VATS for drainage of what apparently was an empyema. The procedure was explained to the patient.  The patient subsequently agreed.  On July 13, 1999, the patient underwent a right VATS and pleural peel and cultures of fluid were done at that time.  A pleural biopsy was also performed.  The patient tolerated the procedure well.  No intraoperative complications occurred. Postoperatively, the patient is satisfactory.  Noted on the stain, there were rare AFB.  This was a stain on the pleural biopsy.  The patient also had a PPD and this was negative at the time of discharge.  The patient also had a noted coagulopathy with a PT of 18.6, INR of 2.0.  Because of this, Dr. 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  The  patient continued to do studies.  His final PT was 17.8 with an INR of 1.8 at the time of discharge. He would continue to follow up with Dr. Cyndie Chime about the coagulopathy.  His lab work shows white cell count 8.2, hemoglobin 9.9, hematocrit 30.1, platelets 460,000.  He was subsequently prepared to go home.  He did have a chest tube in. There was a colostomy bag over the chest tube, which was used to catch the draining.  The patients wife is a Engineer, civil (consulting), so she knows how to take care of this.  She was taught and spoken to about taking care of the colostomy bag.  Dr. Edwyna Shell saw the patient and discussed discharge plans at this time.  DISCHARGE MEDICATIONS: 1. Ferrous sulfate 325 mg t.i.d. between meals. 2. Welchol ______ mg b.i.d. 3. Synthroid 0.12 mg q.d. 4. Plavix 75 mg q.d. 5. Aspirin 325 mg q.d. 6.  Serevent 2 puffs q.d. 7. Ventolin inhaler 2 puffs p.r.n. 8. Ambien 10 mg q.d. 9. Tylox 1-2 p.o. q.6-8h. p.r.n.  DISPOSITION:  The patient was subsequently discharged home on July 20, 1999 in satisfactory and stable condition.  FOLLOW-UP:  The patient will follow up with Dr. Edwyna Shell on Wednesday, July 26, 1999.  He will also get a chest x-ray from [facility]. DD:  07/20/99 TD:  07/21/99 Job: [id number]

## 2010-09-29 NOTE — Op Note (Signed)
[place]. [facility]  Patient:    Glenn Reilly                     MRN: [id number] Proc. Date: 06/09/99 Adm. Date:  [id number] Attending:  Cameron Proud CC:         Barbaraann Share, M.D. [facility]                           Operative Report  PREOPERATIVE DIAGNOSIS: Right superior segmental mass.  POSTOPERATIVE DIAGNOSIS: Undifferentiated cancer, right lower lobe superior segment.  OPERATION: Fibrobronchoscopy, right VATS, right lower lobectomy.  SURGEON: D. Karle Plumber, M.D.  ANESTHESIA: General.  DESCRIPTION OF PROCEDURE: After percutaneous insertion of all monitoring lines, the patient underwent general anesthesia.  A dual lumen tube was inserted.   A fibrobronchoscope was passed.  The right upper lobe, right middle lobe and right lower lobe orifices were normal.  The left upper lobe and left lower lobe orifices were normal.  The fibrobronchoscope was removed.  The patient was turned to the  right lateral thoracotomy position.  Two trocar sites remaining in the anterior  posterior axillary line and the 7th intercostal space in the midaxillary line and the 5th intercostal space and the trocar site at the midaxillary line in the 5th intercostal space.  These trocars were inserted.  A lesion was seen in the posterior segment of the left upper lobe.  The patient also had some pleural plaques that were evident.  The midtrocar site was enlarged just about 3-4 cm and the 5th intercostal space was entered after reflecting the latissimus laterally and the serratus medially.  Two 2Ks were placed at right angles and dissection was started in the fissure.  Dissection was carried down to the artery and there were several nodes around the artery.  Also, the right lower lobe was stuck to the diaphragm and was taken off of the diaphragm with electrocautery.  There were some pleural plaques laterally on the chest wall.  Dissection was carried down  to the superior segment of the pulmonary artery and it was ligated with 2-0 silk, clipped proximally and distally and divided.  The rest of the major fissure was divided  with an Autosuture of 60 staple in two applications.  This exposed the two nodes around the pulmonary artery.  There were dissected out at 11R and 10R nodes and the lower lobe was stapled with an Autosuture 45 stapler.  The inferior pulmonary ligament was taken down and the inferior pulmonary vein as dissected out and stapled with Autosuture stapler.  The the bronchus was stapled with a TL30 Ethicon stapler and divided distally.  All bleeders were electrocoagulated.  Two chest tubes were placed through the trocar sites and tied in place with 0 silk.  The chest was closed with two pericostals, #1 Dexon to the muscle layer, 2-0 Dexon to the subcutaneous tissues and Ethicon skin clips. The patient returned to the recovery room in stable condition. DD:  06/09/99 TD:  06/10/99 Job: [id number]

## 2010-09-29 NOTE — Consult Note (Signed)
[place]. [facility]  Patient:    Glenn Reilly                     MRN: [id number] Proc. Date: 06/14/99 Adm. Date:  [id number] Attending:  Cameron Proud CC:         Norton Blizzard, M.D.             Barbaraann Share, M.D. [facility]             Peter R. Myna Hidalgo, M.D.             Dr. Thereasa Solo Dhatt, Cecile Sheerer. Juanda Chance, M.D. 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  REFERRING PHYSICIAN:  D. Karle Plumber, M.D.  HISTORY OF PRESENT ILLNESS:  Glenn Reilly is a very nice 77 year old white gentleman with a history of coronary artery disease.  He initially was noted to  have a left lower lobe lesion on routine chest x-ray prior to cardiac catheterization.  I think this was about May.  Follow-up chest x-ray was done which was stable.  However, a third chest x-ray did show some growth.  A CT scan was subsequently performed which confirmed a density in the right lower lobe.  Glenn Reilly was seen by Dr. Shelle Iron.  Dr. Shelle Iron suggested a PET scan had been  previously done at [facility].  This did show a right posterior perihilar  mass which was consistent with metastasis.  There was some ____________ in the mediastinum but it was felt that this was not malignant.  Glenn Reilly was then referred to Dr. Dewayne Shorter.  Dr. Edwyna Shell went ahead and admitted him on June 09, 1999.  Dr. Edwyna Shell performed a right lower lobectomy. The pathology report ([id number]) revealed a large cell undifferentiated carcinoma.  The tumor measured 2.2 cm.  He had six lymph nodes sampled and all ere negative.  As such, he is a stage 1A (T1, N0, M0) nonsmall cell lung cancer of he right lung.  ____________ he had no symptoms.  There is no chest pain, cough or  hemoptysis.  He does have a history of asthma.  PAST MEDICAL HISTORY: 1.  "Bronchial asthma." 2. Coronary artery disease. 3. Hypertension. 4. Benign prostatic hypertrophy. 5. Hypothyroidism. 6. Hypercholesterolemia.  ALLERGIES:  DEMEROL.  MEDICATIONS ON ADMISSION: 1. Univasc 7.5 mg q.d. 2. Sular 40 mg q.d. 3. Synthroid 0.112 mg q.d. 4. Welchol 625 mg b.i.d. 5. Aspirin 1 p.o. q.d. 6. Serevent inhaler. 7. Ventolin inhaler. 8. Xanax as needed.  SOCIAL HISTORY:  He works in a Teacher, music.  He had about a 20 packyear history of tobacco use.  He stopped 20 years ago.  There was no history of alcohol use. He was in the [employer].  FAMILY HISTORY:  Remarkable for coronary artery disease.  He had one brother with esophageal cancer a year ago.  REVIEW OF SYSTEMS:  As in the history of present illness.  PHYSICAL EXAMINATION:  GENERAL:  The patient is a well-developed, well-nourished white gentleman in no  obvious distress.  VITAL SIGNS:  Temperature of 99, pulse 72, respiratory rate 16, blood pressure 126/56.  HEENT:  Normocephalic and atraumatic skull. There were no  ocular or oral lesions. He has no palpable cervical or supraclavicular lymph nodes.  He does have a central catheter into the right neck.  LUNGS:  Decreased breath sounds at the bases.  There was no wheezing appreciated.  CARDIAC:  Regular rate and rhythm with normal S1 and S2.  He had murmurs, rubs r bruits.  ABDOMEN:  Soft abdomen with good bowel sounds.  There was no palpable abdominal  mass.  There was no palpable hepatosplenomegaly.  EXTREMITIES:  No clubbing, cyanosis or edema.  NEUROLOGIC:  No focal neurological deficits.  LABORATORY DATA:  White blood cell count 5800, hemoglobin 14, hematocrit 40, platelet count 207,000.  Sodium 135, potassium 4.5, BUN 19, creatinine 0.9, calcium 9.9 with an albumin of 4.0.  LFTs were normal.  IMPRESSION:  Glenn Reilly is a 77 year old gentleman with a stage 1A (T1, N0, 0) nonsmall cell lung cancer of the right lower lobe.  He  underwent resection.  At this point there was no role for adjuvant chemotherapy or radiation therapy or stage 1A nonsmall cell lung cancer.  We do have some adjuvant protocols for stage 1B nonsmall cell lung cancer but again, he does not qualify secondary to his stage.  I suspect that his five year survivial should be about 70-80%.  I do not see a ole for CT scan of the brain or bone scan as he is asymptomatic with respect to both these areas.  However, if Dr. Edwyna Shell wishes, he could certainly get these done. At this point, I do not think that we need to follow up with Glenn Reilly as an outpatient.  He will be followed with Dr. Sherlyn Lick and Dr. Edwyna Shell and I just do not think that we would be able to offer much in the way of further management.  I appreciate the opportunity to see Glenn Reilly. DD:  06/14/99 TD:  06/14/99 Job: [id number]

## 2010-09-29 NOTE — Consult Note (Signed)
[place]. [facility]  Patient:    Glenn Reilly, Glenn Reilly                    MRN: [id number] Proc. Date: 07/15/99 Adm. Date:  [id number] Disc. Date: [id number] Attending:  Cameron Proud CC:         Harl Bowie, M.D., [place], [place]             D. Karle Plumber, M.D.             Barbaraann Share, M.D. [facility]                          Consultation Report  HEMATOLOGY CONSULTATION  HISTORY OF PRESENT ILLNESS:  [age]-year-old man who underwent a right upper lobectomy for non-small cell lung cancer (large cell undifferentiated carcinoma) on June 09, 1999.  He was found to have stage I disease.  He was not felt to be a candidate for any additional treatment other than observation.  He was readmitted here on July 02, 1999 with fever and a loculated right pleural effusion and  subsequently found to have an empyema.  A right VATS and drainage procedure were done on July 13, 1999.  While in the hospital, he has had progressive anemia, reactive thrombocytosis, with a fall in his hemoglobin down to 7.5 and rise in is platelet count as high as 796,000.  He has had persistent elevation of both pro  time and PTT, with values as high as 18.6 and 52 seconds, respectively.  Review of old records reveals prior elevation of pro time in the range of 14.2 to 17.3 during a March 1999 admission, with a normal PTT of 35 seconds.  During recent June 08, 1999 admission, pro time was elevated at 15.1 seconds and PTT normal at 35 seconds.  Of note, he has been on no anticoagulants.  He has no known liver disease and liver functions are normal.  During this admission, he has not received any heparin and he is only getting saline flushes.  Of note, he has been on multiple antibiotics prior to this admission and at present.  PAST HISTORY:  Notable for coronary artery disease, status post stent procedure, obstructive airway disease and hypertension.  MEDICATIONS  AT TIME OF ADMISSION:  1. Plavix 75 mg q.d.  2. Univasc 7.5 mg q.d.  3. Synthroid 0.112 mg q.d.  4. Welchol 625 mg b.i.d.  5. Coated aspirin 325 mg q.d.  6. Serevent two puffs q.d.  7. Ventolin two puffs p.r.n.  8. Tylenol p.r.n.  9. Keflex 500 mg t.i.d., started on June 28, 1999. 10. Z-Pack, started prior to admission. 11. Ambien p.r.n. sleep.  REVIEW OF SYSTEMS:  He has never had any prior bleeding problems with previous surgical procedures.  FAMILY HISTORY:  No known family history of any bleeding disorders.  IMPRESSION:  Unexplained coagulopathy with elevation of both prothrombin time and partial thromboplastin time.  Elevations of both of these tests suggests either a factor deficiency or the presence of an inhibitor to coagulation involving a common pathway factor in the coagulation cascade (factors V, X, II or fibrinogen).  I have ordered mixing studies with patients plasma and normal plasma.  If he corrects, this would be diagnostic for factor deficiency; if he does not correct, then additional studies will be indicated to look for an inhibitor to coagulation. Most common inhibitor would be the lupus anticoagulant, which is really a misnomer.  These patients have a tendency to clot and not to bleed.  ADDENDUM:  Mixing studies show baseline PTT of 46 seconds; 1:1 mix 39 seconds, which remained at 39 seconds after one-hour incubation.  Pro time 18.2 seconds, 1:1 mix 14.7 seconds, one-hour incubation of 15.2 seconds.  These studies are consistent with the presence of an inhibitor to coagulation.  I will now assay or lupus-type anticoagulant and also get specific factor assays.  A lupus-type anticoagulant would not require any specific treatment. Prophylactic anticoagulation should be used if he is at bedrest.  If he has an inhibitor to coagulation, this is more serious.  It is sometimes necessary to suppress the inhibitor using either steroids or steroids  plus cytotoxic drugs such as Cytoxan.  Thank you for this consultation.  I will follow up with the special tests. DD:  07/18/99 TD:  07/18/99 Job: [id number]

## 2010-09-29 NOTE — Discharge Summary (Signed)
NAME:  Glenn Reilly, Glenn Reilly                       ACCOUNT NO.:  [id number]   MEDICAL RECORD NO.:  [id number]                   PATIENT TYPE:  INP   LOCATION:  2912                                 FACILITY:  [facility]   PHYSICIAN:  Charlies Constable, M.D. [facility]              DATE OF BIRTH:  1928-09-02   DATE OF ADMISSION:  06/24/2002  DATE OF DISCHARGE:  06/26/2002                           DISCHARGE SUMMARY - REFERRING   HISTORY OF PRESENT ILLNESS:  Glenn Reilly is a 77 year old male patient  with a known history of coronary artery disease. He had a stent placed to  the right coronary artery in 1999. He presented to the office on 06/24/02  from a referral from Dr. Park Breed at [facility]. The patient's wife is  very sick and, approximately six weeks ago, underwent an aortic and a mitral  valve replacement with a tricuspid valve repair. She apparently is on a  ventilator and has a poor prognosis. Over the past three weeks, he has noted  increasing substernal chest pain and some mild shortness of breath, but he  does have at baseline shortness of breath secondary to chronic asthma and  secondary to right lobectomy secondary to lung cancer. He was seen in the  office on 06/24/02 and subsequently admitted for evaluation. He underwent  cardiac catheterization on 06/25/02 which revealed calcifications in the LAD  with a 40% proximal circumflex stenosis. The RCA had a less than 10%  stenosis proximally at the stent site. LV gram was normal with an EF of 60%.  There was an 80% stenosis of the right iliac. There is not felt to be any  sources of ischemia, and the patient was monitored overnight. The following  day, he was prepared for discharge to home. His vitals were stable and as  follows:  Temperature 97.1, blood pressure 116/56, pulse 70, respirations  20, O2 saturation 97% on room air. Last studies revealed cardiac isoenzymes  negative x3. C-reactive protein less than 0.1. Total cholesterol 169,  triglycerides 93, LDL 102, HDL 43. Hemoglobin 13.4, hematocrit 38.8,  platelets 212, white count 5.2.   At this point, his nonobstructive coronary artery disease was not felt to be  contributing to his chest pain; more perhaps, we are concerned with the  increased stress and anxiety that has been placed on the patient secondary  to the illness of his wife as a possible additive factor. The patient wishes  to be placed on an antidepressant and is prepared to call Dr. Sherlyn Lick for an  appointment next week for a leg check and to discuss this further with him.  The patient will be discharged to home in stable condition on the following  medications.   DISCHARGE MEDICATIONS:  1. Enteric-coated aspirin 325 mg a day.  2. Plavix 75 mg a day.  3. Uniretic 15/25 one half tablet daily.  4. Ventolin as needed.  5. Synthroid 100 mcg one p.o. daily.  6. Pravachol 40 mg q.h.s.  7. Zetia 10 mg one p.o. daily.  8. Tylenol one to two tablets every six hours as needed for pain.   ACTIVITY:  No strenuous activity for two days and then gradual increase  activity.   DIET:  Remain on a low fat diet.   WOUND CARE:  Clean over catheterization site with soap and water.   FOLLOW UP:  Call for questions or concerns. He has agreed to arrange  followup with Dr. Sherlyn Lick next week.    DISCHARGE DIAGNOSES:  1. Chest pain, resolved.  2. Coronary artery disease, nonobstructive.  3. Anxiety due to stress.  4. Hypertension, treated.  5. Hypothyroidism, treated.  6. Hyperlipidemia, treated.  7. History of Stage I large cell undifferentiated carcinoma requiring a     right lobectomy under the care of Dr. Edwyna Shell.  8. History of asthma.     Guy Franco, P.A. [facility]                      Charlies Constable, M.D. [facility]    LB/MEDQ  D:  06/26/2002  T:  06/26/2002  Job:  [id number]   cc:   Harl Bowie, M.D.  [address]  [place]  [place] [address]  Fax: [phone]

## 2010-09-29 NOTE — Consult Note (Signed)
[place]. [facility]  Patient:    Glenn Reilly, Glenn Reilly                    MRN: [id number] Adm. Date:  [id number] Disc. Date: [id number] Attending:  Cameron Proud Dictator:   Genene Churn. Cyndie Chime, M.D. CC:         Norton Blizzard, M.D.             Harl Bowie, M.D.             Barbaraann Share, M.D. [facility]                          Consultation Report  ADDENDUM  Results of special hematology tests have come back.  These show that he has a normal factor II level of 97%, X level of 92%.  Factor X level is decreased at 1%, normal 50-150.  A lupus anticoagulant by Jo-1 antibody assay is in an inconclusive range at 37 units, normal less than 20, positive greater than 40.  IMPRESSION:  Inhibitory activity against factor V.  Presently, this is not clinically significant and may be a lupus anticoagulant.  RECOMMENDATIONS:  Since he is otherwise stable with no active bleeding, I would  just follow him as an outpatient with you. DD:  07/19/99 TD:  07/19/99 Job: [id number]

## 2010-09-29 NOTE — Cardiovascular Report (Signed)
NAME:  Glenn Reilly, Glenn Reilly                       ACCOUNT NO.:  [id number]   MEDICAL RECORD NO.:  [id number]                   PATIENT TYPE:  INP   LOCATION:  2013                                 FACILITY:  [facility]   PHYSICIAN:  Charlies Constable, M.D. [facility]              DATE OF BIRTH:  09/08/1928   DATE OF PROCEDURE:  06/25/2002  DATE OF DISCHARGE:                              CARDIAC CATHETERIZATION   CLINICAL HISTORY:  The patient is 77 years old and had a stent placed in the  right coronary artery a number of years ago.  His wife has been very ill at  [facility], following a second open-heart operation on her aortic  valve.  While with his wife, the patient has developed left arm pain over  the last couple of weeks.  He told the doctor at [facility] about it, and they  arranged for him to come over for evaluation at his request, and we felt  that his symptoms might be consistent with unstable angina, and arranged for  him to come in the hospital for angiography.   DESCRIPTION OF PROCEDURE:  The procedure was performed via the right femoral  artery using an arterial sheath and 6-French preformed coronary catheters.  A front wall arterial puncture was performed, and Omnipaque contrast was  used.  We had some difficulty navigating up the right iliac and did exchange  that during the second pass.  We had trouble with damping in the left main  catheter and had to use a JL4 6-French guiding catheter with sideholes.  A  distal aortogram was performed to rule out abdominal aortic aneurysm.   RESULTS:  1. The aortic pressure was 201/93 with a mean of 138.  2. Left ventricle pressure was 201/14.  3. Left main coronary artery:  The left main coronary artery was free of     significant disease.  4. Left anterior descending artery:  The left anterior descending artery     gave rise to three diagonal branches and a septal perforator.  The LAD     was calcified, and there were irregularities but no  major obstruction.  5. Left circumflex artery:  The left circumflex artery gave rise to a     marginal branch and three posterolateral branches.  There was 40%     narrowing in the proximal vessel with calcification but no other major     obstruction.  6. Right coronary artery:  The right coronary artery was a moderate-sized     vessel that gave rise to a right ventricular branch, a posterior     descending branch, and a posterolateral branch.  There was a stent in the     proximal right coronary artery with less than 10% narrowing.  There was     no other major obstruction.  7. Left ventriculogram:  The left ventriculogram, performed in the RAO     projection, showed  good wall motion with no areas of hypokinesis.  The     estimated ejection fraction was 60%.  8. Distal aortogram:  A distal aortogram was performed which showed a patent     renal artery on the left.  I was not sure we could see  the renal artery     on the right, but this may have been related to position of the catheter.     There was 50% narrowing of the distal aorta and 80% ostial narrowing of     the right iliac.   CONCLUSION:  1. Coronary artery disease status post prior remote stenting of the right     coronary artery with less than 10% narrowing at the stent site in the     right coronary artery, 40% narrowing in the proximal circumflex artery,     no major obstruction in the left anterior descending artery, and normal     left ventricular function.  2. An 80% right iliac stenosis.    RECOMMENDATIONS:  The patient does not have any definite source of ischemia.  In view of these findings, I think his symptoms are probably not ischemic.  We will discuss other possibilities for the etiology of his symptoms.                                               Charlies Constable, M.D. [facility]    BB/MEDQ  D:  06/25/2002  T:  06/25/2002  Job:  [id number]   cc:   Harl Bowie, M.D.  [address]  [place]  [place] [address]   Fax: [phone]   Cardiac Catheterization Lab

## 2010-09-29 NOTE — Discharge Summary (Signed)
[place]. [facility]  Patient:    Glenn Reilly                     MRN: [id number] Adm. Date:  [id number] Disc. Date: [id number] Attending:  Cameron Proud Dictator:   Eugenia Pancoast, P.A. CC:         Barbaraann Share, M.D. [facility]             Malkiat Dhatt, M.D.             D. Karle Plumber, M.D.             Rose Phi. Myna Hidalgo, M.D.                           Discharge Summary  DATE OF BIRTH:  22-Jul-1928.  FINAL DIAGNOSES: 1. Stage IA undifferentiated carcinoma of the right lung. 2. Hypertension. 3. Benign prostatic hypertrophy. 4. Hiatal hernia with gastroesophageal reflux. 5. Chronic obstructive pulmonary disease. 6. Hypothyroid. 7. Hypercholesterolemia.  PROCEDURES:  June 09, 1999:  Flexible bronchoscopy, right video-assisted thoracoscopic surgery, and right mini thoracotomy with right lower lobectomy and multiple lymph node biopsies.  SURGEON:  Dr. Edwyna Shell.  ADMISSION HISTORY:  A 77 year old gentleman who was referred by Dr. Marcelyn Bruins after the patient was found to have an enlarging right lower lobe superior segment nodule, first diagnosed in May 2000.  His June studies did not show any change;  however, instead a December scan of the chest demonstrated 1.3 x 1.9 cm right lower lobe enlarging nodule.  On May 22, 1999, a PET scan revealed a discrete right posterior perihilar mass with increased activity suspicious for primary lung malignancy.  Also, there was an uptake in both hilar areas and the AP window areas not definitely malignant nor benign.  As well, there was an increased activity n the inferior right internal jugular chain.  Because of these the patient was referred to Dr. Remo Lipps office.  His PFTs apparently were adequate, and his ABGs were within normal limits.  Dr. Edwyna Shell spoke with the patient and discussed bronchoscopy and VATS procedure, risks and benefits were explained, and the patient understood and  agreed to surgery.  HOSPITAL COURSE:  On June 09, 1999, the patient was admitted to [facility], and at that time he underwent a flexible bronchoscopy and a right VATS with mini thoracotomy and right lower lobectomy with multiple lymph node biopsies. The patient tolerated the procedure well. No intraoperative complications occurred. Postoperatively the patient did satisfactorily.  He was seen by Dr. Shelle Iron in consult, and he continued to progress satisfactorily.  His problem was of an air leak which persisted throughout his stay.  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  The patient was informed of this.  Bone scan and CT scan of the head were performed, both were negative for any signs of metastasis. Because of this Dr. Myna Hidalgo noted that the patient would not need any type of follow-up other than with periodic chest x-rays.  The patient was subsequently prepared for discharge on June 16, 1999.  He continued to have an air leak, nd the chest tube was connected to a Heimlich valve, and the patient would be discharged with the Heimlich valve in place.  He was taught how to care for this. His daughter, also a Engineer, civil (consulting),  knows how to take care of this.  DISCHARGE MEDICATIONS:  The patient was discharged on the following medicines:  1. Plavix 75 mg q.d.  2. Univasc 7.4 mg q.d.  3. Sular 40 mg q.d.  4. Synthroid 0.12 mg q.d.  5. WelChol 625 mg three b.i.d.  6. Enteric-coated aspirin 325 mg q.d.  7. Serevent two puffs q.d.  8. Ventolin two puffs p.r.n.  9. Xanax 0.25 mg p.r.n. 10. Tylox one to two p.o. q.4-6h. p.r.n. pain.  FOLLOWUP:  The patient will see Dr. Edwyna Shell on Tuesday, February 6 at 3:20 p.m. He will go to [facility] on Tuesday, February 6 at 2:20 p.m. and  have a chest x-ray which he will bring to Dr. Remo Lipps office.  CONDITION ON  DISCHARGE:  The patient is subsequently discharged home in satisfactory stable condition on June 16, 1999. DD:  06/16/99 TD:  06/17/99 Job: [id number]

## 2010-09-29 NOTE — Op Note (Signed)
[place]. [facility]  Patient:    Glenn Reilly, Glenn Reilly                    MRN: [id number] Proc. Date: 07/13/99 Adm. Date:  [id number] Disc. Date: [id number] Attending:  Cameron Proud                           Operative Report  PREOPERATIVE DIAGNOSIS:  Status post right upper lobectomy for nonsmall cell lung cancer and postoperative empyema.  POSTOPERATIVE DIAGNOSIS:  Status post right upper lobectomy for nonsmall cell lung cancer and postoperative empyema.  OPERATION PERFORMED:  Right VATS.  Rib resection.  Drainage of empyema.  DESCRIPTION OF PROCEDURE:   After general anesthesia the patient was turned to he bilateral thoracotomy position; was prepped and draped in the usual sterile manner. He was turned to the previous posterior trocar site and this was opened and dissected down to the diaphragm and suctioned superiorly up the diaphragm to the pleura.  Pleura was opened.  There was a large amount of exudate, not too much [place] without exudate in the posterior cavity.  This was irrigated out and cleaned out with [facility] ring forceps and multiple irrigations.  A 0-degree scope was placed in to the cavity to make sure all debris had been removed.  This scope was used several times, and then debridement in order to guide the removal of all the exudate.  After this had all been done, two chest tubes were placed into cavity and sutured into place with 0 silk.  The rest of the opening was closed with 0 silk.  A porous, small piece of rib was resected subperiosteally to make sure there was  large opening for the tube drainage.  The dry sterile dressings were applied. he patient was returned to the recovery room in stable condition. DD:  07/13/99 TD:  07/13/99 Job: [id number]

## 2010-09-29 NOTE — Op Note (Signed)
NAME:  Glenn Reilly, Glenn Reilly                       ACCOUNT NO.:  [id number]   MEDICAL RECORD NO.:  [id number]                   PATIENT TYPE:  OIB   LOCATION:  NA                                   FACILITY:  [facility]   PHYSICIAN:  Ines Bloomer, M.D.              DATE OF BIRTH:  Mar 10, 1929   DATE OF PROCEDURE:  09/17/2003  DATE OF DISCHARGE:                                 OPERATIVE REPORT   PREOPERATIVE DIAGNOSIS:  Left costal margin lipoma.   POSTOPERATIVE DIAGNOSIS:  Left costal margin lipoma.   OPERATION PERFORMED:  Excision of left chest mass lipoma.   SURGEON:  Ines Bloomer, M.D.   ASSISTANT:  Coral Ceo, P.A.   ANESTHESIA:  General.   DESCRIPTION OF PROCEDURE:  After prepping and draping the left costal  margin, a 6 cm incision was made and dissection was carried down to the  subcutaneous tissue to the underlying lipoma and a wide excision was made  with electrocautery, excising the lipoma down to the costal margin and the  intercostal muscle.  After the mass which is about 3 x 4 cm was excised, the  area was irrigated copiously.  The wound was closed with interrupted 2-0  Vicryl in the subcutaneous tissue and 4-0 Vicryl subcuticular stitch.  The  patient tolerated the procedure well, was returned to recovery room in  stable condition.                                               Ines Bloomer, M.D.    DPB/MEDQ  D:  09/17/2003  T:  09/17/2003  Job:  [id number]   cc:   Marcelyn Bruins, M.D. [facility]

## 2010-09-29 NOTE — H&P (Signed)
NAME:  Glenn Reilly, Glenn Reilly NO.:  [id number]   MEDICAL RECORD NO.:  [id number]                    PATIENT TYPE:  INP   LOCATION:                                       FACILITY:   PHYSICIAN:  Charlies Constable, M.D. [facility]              DATE OF BIRTH:  [birth year]   DATE OF ADMISSION:  06/24/2002  DATE OF DISCHARGE:                                HISTORY & PHYSICAL   CHIEF COMPLAINT:  Left arm pain.   HISTORY OF PRESENT ILLNESS:  The patient is a 77 year old male patient with  a known history of coronary artery disease.  He underwent stent placement to  the RCA in 1999 with a re-look angiogram in 2000, which revealed left main  with irregularity, 40% proximal LAD with a 50% D2 lesion, RCA with a 40% in-  stent restenosis.  The LV-gram revealed no wall motion abnormalities, EF of  55%.  Now, the patient returns today complaining of recurrent left arm pain  with and without exercise but it is similar to previous anginal pain.  He  does have some mild dyspnea; however, he does have chronic asthma and has  undergone a right lobectomy in the past by Dr. Dewayne Shorter, therefore he does  have baseline shortness of breath.   In addition, the patient has an increased stress level and his wife has been  in the hospital for six weeks at [facility].  She underwent an aortic valve and  mitral valve replacement with a tricuspid valve repair.  She is now on  dialysis and has retained approximately 50 pounds of fluid and has a poor  prognosis.   REVIEW OF SYSTEMS:  He denies any PND, orthopnea, dizziness, or syncope.  Review of systems are otherwise negative.   ALLERGIES:  DEMEROL.   MEDICATIONS:  1. Plavix 75 mg a day.  2. Uniretic 15/25 one-half tablet daily.  3. Baby aspirin daily.  4. Ventolin p.r.n.  5. Synthroid 100 mcg a day.  6. Pravachol 40 mg q.h.s.  7. Zetia 10 mg a day.   PAST MEDICAL HISTORY:  1. Coronary artery disease, as above.  2. Hypothyroidism,  replaced.  3. Hyperlipidemia, treated.  4. Empyema of the right lung, status post right lower lobectomy with stage I     large cell undifferentiated carcinoma (Dr. Karle Plumber).  He was seen     by Dr. Cephas Darby for his carcinoma and in 2001 underwent special     hematology tests that revealed a normal factor II and a factor X level     that was slightly decreased at 41%.  He does have inhibitory activity     against factor V but was not felt to be clinically significant but was     felt to be a lupus anticoagulant.  5. History of asthma.  6. Hypertension.   FAMILY HISTORY:  Noncontributory.   SOCIAL HISTORY:  No tobacco, alcohol, or illicit drug use.   PHYSICAL EXAMINATION:  VITAL SIGNS:  Weight 153 pounds, blood pressure  126/69, pulse 69.  HEENT:  Grossly normal.  NECK:  Carotid brisk, no carotid bruit.  No JVD or thyromegaly.  CHEST:  Clear to auscultation on the left with decreased breath sounds on  the right, status post lobectomy.  HEART:  Regular rate and rhythm.  No murmurs, rubs, or gallops.  ABDOMEN:  Normal bowel sounds.  Nontender, nondistended.  No masses, no  bruits.  EXTREMITIES:  No femoral bruits.  Lower extremities with no peripheral  edema.   ASSESSMENT AND PLAN:  1. Unstable angina.  2. Coronary artery disease, status post stent placement to the right     coronary artery in 1999.  3. Hypothyroidism, treated.  4. Hyperlipidemia, treated.  We will go ahead and check a fasting lipid     profile in the morning.  5. History of lung carcinoma, status post right lobectomy under the care of     Dr. Karle Plumber in 2001.  6. Hypertension, treated.   The patient was seen and examined by Dr. Charlies Constable.  Plans are to admit  him, followup cardiac isoenzymes, and plan cardiac catheterization in the  a.m.  Please note that the patient is intolerant to NITROGLYCERIN and  develops a severe headache and does not want to be placed on nitrates  secondary to  tic douloureux.     Guy Franco, P.A. [facility]                      Charlies Constable, M.D. [facility]    LB/MEDQ  D:  06/24/2002  T:  06/24/2002  Job:  [id number]

## 2010-09-29 NOTE — Procedures (Signed)
[facility]  Patient:    Glenn Reilly                     MRN: [id number] Proc. Date: 06/09/99 Adm. Date:  [id number] Attending:  Cameron Proud CC:         Anesthesia Department                           Procedure Report  PROCEDURE:  Insertion of epidural catheter for postoperative pain control.  INDICATIONS:  Mr. Steib was brought to the operating room today by Dr. Edwyna Shell for resection of a right lower lobe lesion.  I spoke with him preoperatively concerning the risks and benefits of epidural analgesia including the risk of infection, ______ intravenous injection, and nerve damage.  He understood these  and wished to proceed.  DESCRIPTION OF PROCEDURE:  At the conclusion of the operation and while still under general anesthesia, he was maintained in the left lateral position.  At the T8-9 interspace, a 17-gauge Tuohy needle was passed to a loss of resistance to normal saline 3 cc.  There was a negative aspiration of blood or CSF, and an 18-gauge epidural catheter was passed through the needle to a depth of 4 cm below the end of the needle.  The needle was removed, and the catheter was secured.  There was a negative aspiration of blood or CSF from the catheter, and a bolus ose of lidocaine, 0.5%, at a volume of 12 cc was given.  Included in this was Fentanyl 100 mcg.  He was turned supine, suctioned, and extubated while in the operating room.  He was taken to the PACU in good condition and will be followed there and afterwards by the anesthesia department while his Fentanyl infusion runs through the epidural  catheter. DD:  06/09/99 TD:  06/11/99 Job: [id number]

## 2011-05-16 DIAGNOSIS — Z5189 Encounter for other specified aftercare: Secondary | ICD-10-CM | POA: Diagnosis not present

## 2011-05-21 DIAGNOSIS — M47817 Spondylosis without myelopathy or radiculopathy, lumbosacral region: Secondary | ICD-10-CM | POA: Diagnosis not present

## 2011-05-21 DIAGNOSIS — M4120 Other idiopathic scoliosis, site unspecified: Secondary | ICD-10-CM | POA: Diagnosis not present

## 2011-05-21 DIAGNOSIS — M5137 Other intervertebral disc degeneration, lumbosacral region: Secondary | ICD-10-CM | POA: Diagnosis not present

## 2011-05-23 DIAGNOSIS — M4120 Other idiopathic scoliosis, site unspecified: Secondary | ICD-10-CM | POA: Diagnosis not present

## 2011-05-25 DIAGNOSIS — D649 Anemia, unspecified: Secondary | ICD-10-CM | POA: Diagnosis not present

## 2011-05-25 DIAGNOSIS — M549 Dorsalgia, unspecified: Secondary | ICD-10-CM | POA: Diagnosis not present

## 2011-05-25 DIAGNOSIS — Z85118 Personal history of other malignant neoplasm of bronchus and lung: Secondary | ICD-10-CM | POA: Diagnosis not present

## 2011-05-29 DIAGNOSIS — Z5189 Encounter for other specified aftercare: Secondary | ICD-10-CM | POA: Diagnosis not present

## 2011-06-04 DIAGNOSIS — M5137 Other intervertebral disc degeneration, lumbosacral region: Secondary | ICD-10-CM | POA: Diagnosis not present

## 2011-06-04 DIAGNOSIS — M4120 Other idiopathic scoliosis, site unspecified: Secondary | ICD-10-CM | POA: Diagnosis not present

## 2011-06-04 DIAGNOSIS — M48061 Spinal stenosis, lumbar region without neurogenic claudication: Secondary | ICD-10-CM | POA: Diagnosis not present

## 2011-06-11 DIAGNOSIS — J45901 Unspecified asthma with (acute) exacerbation: Secondary | ICD-10-CM | POA: Diagnosis not present

## 2011-06-11 DIAGNOSIS — J209 Acute bronchitis, unspecified: Secondary | ICD-10-CM | POA: Diagnosis not present

## 2011-06-13 DIAGNOSIS — I6529 Occlusion and stenosis of unspecified carotid artery: Secondary | ICD-10-CM | POA: Diagnosis not present

## 2011-06-13 DIAGNOSIS — I708 Atherosclerosis of other arteries: Secondary | ICD-10-CM | POA: Diagnosis not present

## 2011-06-20 DIAGNOSIS — Z5189 Encounter for other specified aftercare: Secondary | ICD-10-CM | POA: Diagnosis not present

## 2011-07-03 DIAGNOSIS — M4120 Other idiopathic scoliosis, site unspecified: Secondary | ICD-10-CM | POA: Diagnosis not present

## 2011-07-11 DIAGNOSIS — Z85118 Personal history of other malignant neoplasm of bronchus and lung: Secondary | ICD-10-CM | POA: Diagnosis not present

## 2011-07-11 DIAGNOSIS — R634 Abnormal weight loss: Secondary | ICD-10-CM | POA: Diagnosis not present

## 2011-07-11 DIAGNOSIS — M545 Low back pain: Secondary | ICD-10-CM | POA: Diagnosis not present

## 2011-07-11 DIAGNOSIS — E785 Hyperlipidemia, unspecified: Secondary | ICD-10-CM | POA: Diagnosis not present

## 2011-07-11 DIAGNOSIS — I10 Essential (primary) hypertension: Secondary | ICD-10-CM | POA: Diagnosis not present

## 2011-07-18 DIAGNOSIS — J189 Pneumonia, unspecified organism: Secondary | ICD-10-CM | POA: Diagnosis not present

## 2011-07-18 DIAGNOSIS — K5730 Diverticulosis of large intestine without perforation or abscess without bleeding: Secondary | ICD-10-CM | POA: Diagnosis not present

## 2011-07-18 DIAGNOSIS — I10 Essential (primary) hypertension: Secondary | ICD-10-CM | POA: Diagnosis not present

## 2011-07-18 DIAGNOSIS — R918 Other nonspecific abnormal finding of lung field: Secondary | ICD-10-CM | POA: Diagnosis not present

## 2011-07-18 DIAGNOSIS — Q619 Cystic kidney disease, unspecified: Secondary | ICD-10-CM | POA: Diagnosis not present

## 2011-07-18 DIAGNOSIS — Z85118 Personal history of other malignant neoplasm of bronchus and lung: Secondary | ICD-10-CM | POA: Diagnosis not present

## 2011-07-18 DIAGNOSIS — Z5189 Encounter for other specified aftercare: Secondary | ICD-10-CM | POA: Diagnosis not present

## 2011-07-18 DIAGNOSIS — I2510 Atherosclerotic heart disease of native coronary artery without angina pectoris: Secondary | ICD-10-CM | POA: Diagnosis not present

## 2011-07-18 DIAGNOSIS — R51 Headache: Secondary | ICD-10-CM | POA: Diagnosis not present

## 2011-07-18 DIAGNOSIS — R5381 Other malaise: Secondary | ICD-10-CM | POA: Diagnosis not present

## 2011-07-18 DIAGNOSIS — R1312 Dysphagia, oropharyngeal phase: Secondary | ICD-10-CM | POA: Diagnosis not present

## 2011-07-18 DIAGNOSIS — Z79899 Other long term (current) drug therapy: Secondary | ICD-10-CM | POA: Diagnosis not present

## 2011-07-18 DIAGNOSIS — J90 Pleural effusion, not elsewhere classified: Secondary | ICD-10-CM | POA: Diagnosis not present

## 2011-07-20 DIAGNOSIS — I2510 Atherosclerotic heart disease of native coronary artery without angina pectoris: Secondary | ICD-10-CM | POA: Diagnosis not present

## 2011-07-20 DIAGNOSIS — J189 Pneumonia, unspecified organism: Secondary | ICD-10-CM | POA: Diagnosis not present

## 2011-07-20 DIAGNOSIS — J90 Pleural effusion, not elsewhere classified: Secondary | ICD-10-CM | POA: Diagnosis not present

## 2011-07-20 DIAGNOSIS — R1312 Dysphagia, oropharyngeal phase: Secondary | ICD-10-CM | POA: Diagnosis not present

## 2011-07-20 DIAGNOSIS — Z5189 Encounter for other specified aftercare: Secondary | ICD-10-CM | POA: Diagnosis not present

## 2011-07-20 DIAGNOSIS — R51 Headache: Secondary | ICD-10-CM | POA: Diagnosis not present

## 2011-07-20 DIAGNOSIS — I10 Essential (primary) hypertension: Secondary | ICD-10-CM | POA: Diagnosis not present

## 2011-07-20 DIAGNOSIS — R918 Other nonspecific abnormal finding of lung field: Secondary | ICD-10-CM | POA: Diagnosis not present

## 2011-07-23 DIAGNOSIS — R51 Headache: Secondary | ICD-10-CM | POA: Diagnosis not present

## 2011-07-23 DIAGNOSIS — R1312 Dysphagia, oropharyngeal phase: Secondary | ICD-10-CM | POA: Diagnosis not present

## 2011-07-23 DIAGNOSIS — J189 Pneumonia, unspecified organism: Secondary | ICD-10-CM | POA: Diagnosis not present

## 2011-07-23 DIAGNOSIS — R5381 Other malaise: Secondary | ICD-10-CM | POA: Diagnosis not present

## 2011-07-23 DIAGNOSIS — J90 Pleural effusion, not elsewhere classified: Secondary | ICD-10-CM | POA: Diagnosis not present

## 2011-07-23 DIAGNOSIS — I10 Essential (primary) hypertension: Secondary | ICD-10-CM | POA: Diagnosis not present

## 2011-07-23 DIAGNOSIS — R918 Other nonspecific abnormal finding of lung field: Secondary | ICD-10-CM | POA: Diagnosis not present

## 2011-07-23 DIAGNOSIS — Z5189 Encounter for other specified aftercare: Secondary | ICD-10-CM | POA: Diagnosis not present

## 2011-07-23 DIAGNOSIS — I2510 Atherosclerotic heart disease of native coronary artery without angina pectoris: Secondary | ICD-10-CM | POA: Diagnosis not present

## 2011-07-27 DIAGNOSIS — I10 Essential (primary) hypertension: Secondary | ICD-10-CM | POA: Diagnosis not present

## 2011-07-27 DIAGNOSIS — R634 Abnormal weight loss: Secondary | ICD-10-CM | POA: Diagnosis not present

## 2011-07-31 DIAGNOSIS — J449 Chronic obstructive pulmonary disease, unspecified: Secondary | ICD-10-CM | POA: Diagnosis not present

## 2011-07-31 DIAGNOSIS — J1000 Influenza due to other identified influenza virus with unspecified type of pneumonia: Secondary | ICD-10-CM | POA: Diagnosis not present

## 2011-07-31 DIAGNOSIS — I2510 Atherosclerotic heart disease of native coronary artery without angina pectoris: Secondary | ICD-10-CM | POA: Diagnosis not present

## 2011-07-31 DIAGNOSIS — R05 Cough: Secondary | ICD-10-CM | POA: Diagnosis not present

## 2011-07-31 DIAGNOSIS — I10 Essential (primary) hypertension: Secondary | ICD-10-CM | POA: Diagnosis not present

## 2011-07-31 DIAGNOSIS — J1100 Influenza due to unidentified influenza virus with unspecified type of pneumonia: Secondary | ICD-10-CM | POA: Diagnosis not present

## 2011-07-31 DIAGNOSIS — R6889 Other general symptoms and signs: Secondary | ICD-10-CM | POA: Diagnosis not present

## 2011-07-31 DIAGNOSIS — D649 Anemia, unspecified: Secondary | ICD-10-CM | POA: Diagnosis not present

## 2011-07-31 DIAGNOSIS — J189 Pneumonia, unspecified organism: Secondary | ICD-10-CM | POA: Diagnosis not present

## 2011-07-31 DIAGNOSIS — R112 Nausea with vomiting, unspecified: Secondary | ICD-10-CM | POA: Diagnosis not present

## 2011-07-31 DIAGNOSIS — E039 Hypothyroidism, unspecified: Secondary | ICD-10-CM | POA: Diagnosis not present

## 2011-07-31 DIAGNOSIS — E785 Hyperlipidemia, unspecified: Secondary | ICD-10-CM | POA: Diagnosis not present

## 2011-07-31 DIAGNOSIS — J180 Bronchopneumonia, unspecified organism: Secondary | ICD-10-CM | POA: Diagnosis not present

## 2011-08-01 DIAGNOSIS — E079 Disorder of thyroid, unspecified: Secondary | ICD-10-CM | POA: Diagnosis present

## 2011-08-01 DIAGNOSIS — I2510 Atherosclerotic heart disease of native coronary artery without angina pectoris: Secondary | ICD-10-CM | POA: Diagnosis present

## 2011-08-01 DIAGNOSIS — J189 Pneumonia, unspecified organism: Secondary | ICD-10-CM | POA: Diagnosis not present

## 2011-08-01 DIAGNOSIS — E039 Hypothyroidism, unspecified: Secondary | ICD-10-CM | POA: Diagnosis present

## 2011-08-01 DIAGNOSIS — I10 Essential (primary) hypertension: Secondary | ICD-10-CM | POA: Diagnosis present

## 2011-08-01 DIAGNOSIS — K219 Gastro-esophageal reflux disease without esophagitis: Secondary | ICD-10-CM | POA: Diagnosis present

## 2011-08-01 DIAGNOSIS — E785 Hyperlipidemia, unspecified: Secondary | ICD-10-CM | POA: Diagnosis present

## 2011-08-01 DIAGNOSIS — Z7902 Long term (current) use of antithrombotics/antiplatelets: Secondary | ICD-10-CM | POA: Diagnosis not present

## 2011-08-01 DIAGNOSIS — Z85118 Personal history of other malignant neoplasm of bronchus and lung: Secondary | ICD-10-CM | POA: Diagnosis not present

## 2011-08-01 DIAGNOSIS — J180 Bronchopneumonia, unspecified organism: Secondary | ICD-10-CM | POA: Diagnosis present

## 2011-08-01 DIAGNOSIS — R9431 Abnormal electrocardiogram [ECG] [EKG]: Secondary | ICD-10-CM | POA: Diagnosis not present

## 2011-08-01 DIAGNOSIS — R112 Nausea with vomiting, unspecified: Secondary | ICD-10-CM | POA: Diagnosis not present

## 2011-08-01 DIAGNOSIS — J1100 Influenza due to unidentified influenza virus with unspecified type of pneumonia: Secondary | ICD-10-CM | POA: Diagnosis not present

## 2011-08-01 DIAGNOSIS — D649 Anemia, unspecified: Secondary | ICD-10-CM | POA: Diagnosis present

## 2011-08-01 DIAGNOSIS — J449 Chronic obstructive pulmonary disease, unspecified: Secondary | ICD-10-CM | POA: Diagnosis present

## 2011-08-01 DIAGNOSIS — Z8673 Personal history of transient ischemic attack (TIA), and cerebral infarction without residual deficits: Secondary | ICD-10-CM | POA: Diagnosis not present

## 2011-08-01 DIAGNOSIS — Z79899 Other long term (current) drug therapy: Secondary | ICD-10-CM | POA: Diagnosis not present

## 2011-08-06 DIAGNOSIS — J1100 Influenza due to unidentified influenza virus with unspecified type of pneumonia: Secondary | ICD-10-CM | POA: Diagnosis not present

## 2011-08-08 DIAGNOSIS — M545 Low back pain: Secondary | ICD-10-CM | POA: Diagnosis not present

## 2011-08-08 DIAGNOSIS — IMO0002 Reserved for concepts with insufficient information to code with codable children: Secondary | ICD-10-CM | POA: Diagnosis not present

## 2011-08-08 DIAGNOSIS — M5137 Other intervertebral disc degeneration, lumbosacral region: Secondary | ICD-10-CM | POA: Diagnosis not present

## 2011-08-14 DIAGNOSIS — Z8673 Personal history of transient ischemic attack (TIA), and cerebral infarction without residual deficits: Secondary | ICD-10-CM | POA: Diagnosis not present

## 2011-08-14 DIAGNOSIS — K5900 Constipation, unspecified: Secondary | ICD-10-CM | POA: Diagnosis not present

## 2011-08-14 DIAGNOSIS — K625 Hemorrhage of anus and rectum: Secondary | ICD-10-CM | POA: Diagnosis not present

## 2011-08-14 DIAGNOSIS — I509 Heart failure, unspecified: Secondary | ICD-10-CM | POA: Diagnosis not present

## 2011-08-14 DIAGNOSIS — I10 Essential (primary) hypertension: Secondary | ICD-10-CM | POA: Diagnosis not present

## 2011-08-14 DIAGNOSIS — C3490 Malignant neoplasm of unspecified part of unspecified bronchus or lung: Secondary | ICD-10-CM | POA: Diagnosis not present

## 2011-08-14 DIAGNOSIS — J449 Chronic obstructive pulmonary disease, unspecified: Secondary | ICD-10-CM | POA: Diagnosis not present

## 2011-08-14 DIAGNOSIS — K649 Unspecified hemorrhoids: Secondary | ICD-10-CM | POA: Diagnosis not present

## 2011-08-17 DIAGNOSIS — I10 Essential (primary) hypertension: Secondary | ICD-10-CM | POA: Diagnosis not present

## 2011-08-22 DIAGNOSIS — IMO0002 Reserved for concepts with insufficient information to code with codable children: Secondary | ICD-10-CM | POA: Diagnosis not present

## 2011-08-22 DIAGNOSIS — M5137 Other intervertebral disc degeneration, lumbosacral region: Secondary | ICD-10-CM | POA: Diagnosis not present

## 2011-09-10 DIAGNOSIS — J189 Pneumonia, unspecified organism: Secondary | ICD-10-CM | POA: Diagnosis not present

## 2011-09-10 DIAGNOSIS — J45901 Unspecified asthma with (acute) exacerbation: Secondary | ICD-10-CM | POA: Diagnosis not present

## 2011-09-10 DIAGNOSIS — J90 Pleural effusion, not elsewhere classified: Secondary | ICD-10-CM | POA: Diagnosis not present

## 2011-09-12 DIAGNOSIS — M5137 Other intervertebral disc degeneration, lumbosacral region: Secondary | ICD-10-CM | POA: Diagnosis not present

## 2011-09-12 DIAGNOSIS — IMO0002 Reserved for concepts with insufficient information to code with codable children: Secondary | ICD-10-CM | POA: Diagnosis not present

## 2011-09-14 DIAGNOSIS — I10 Essential (primary) hypertension: Secondary | ICD-10-CM | POA: Diagnosis not present

## 2011-09-18 DIAGNOSIS — Z85118 Personal history of other malignant neoplasm of bronchus and lung: Secondary | ICD-10-CM | POA: Diagnosis not present

## 2011-09-18 DIAGNOSIS — I2510 Atherosclerotic heart disease of native coronary artery without angina pectoris: Secondary | ICD-10-CM | POA: Diagnosis not present

## 2011-09-18 DIAGNOSIS — Q619 Cystic kidney disease, unspecified: Secondary | ICD-10-CM | POA: Diagnosis not present

## 2011-09-18 DIAGNOSIS — Z5189 Encounter for other specified aftercare: Secondary | ICD-10-CM | POA: Diagnosis not present

## 2011-09-18 DIAGNOSIS — R1312 Dysphagia, oropharyngeal phase: Secondary | ICD-10-CM | POA: Diagnosis not present

## 2011-09-28 DIAGNOSIS — I10 Essential (primary) hypertension: Secondary | ICD-10-CM | POA: Diagnosis not present

## 2011-09-28 DIAGNOSIS — I2510 Atherosclerotic heart disease of native coronary artery without angina pectoris: Secondary | ICD-10-CM | POA: Diagnosis not present

## 2011-09-28 DIAGNOSIS — R002 Palpitations: Secondary | ICD-10-CM | POA: Diagnosis not present

## 2011-09-28 DIAGNOSIS — E785 Hyperlipidemia, unspecified: Secondary | ICD-10-CM | POA: Diagnosis not present

## 2011-10-05 DIAGNOSIS — R002 Palpitations: Secondary | ICD-10-CM | POA: Diagnosis not present

## 2011-10-15 DIAGNOSIS — Z5189 Encounter for other specified aftercare: Secondary | ICD-10-CM | POA: Diagnosis not present

## 2011-10-15 DIAGNOSIS — Z85118 Personal history of other malignant neoplasm of bronchus and lung: Secondary | ICD-10-CM | POA: Diagnosis not present

## 2011-10-15 DIAGNOSIS — R1312 Dysphagia, oropharyngeal phase: Secondary | ICD-10-CM | POA: Diagnosis not present

## 2011-10-15 DIAGNOSIS — I2510 Atherosclerotic heart disease of native coronary artery without angina pectoris: Secondary | ICD-10-CM | POA: Diagnosis not present

## 2011-10-17 DIAGNOSIS — R609 Edema, unspecified: Secondary | ICD-10-CM | POA: Diagnosis not present

## 2011-10-22 DIAGNOSIS — M5137 Other intervertebral disc degeneration, lumbosacral region: Secondary | ICD-10-CM | POA: Diagnosis not present

## 2011-10-22 DIAGNOSIS — M5380 Other specified dorsopathies, site unspecified: Secondary | ICD-10-CM | POA: Diagnosis not present

## 2011-10-22 DIAGNOSIS — M533 Sacrococcygeal disorders, not elsewhere classified: Secondary | ICD-10-CM | POA: Diagnosis not present

## 2011-10-31 DIAGNOSIS — I2510 Atherosclerotic heart disease of native coronary artery without angina pectoris: Secondary | ICD-10-CM | POA: Diagnosis not present

## 2011-10-31 DIAGNOSIS — Z85118 Personal history of other malignant neoplasm of bronchus and lung: Secondary | ICD-10-CM | POA: Diagnosis not present

## 2011-10-31 DIAGNOSIS — M533 Sacrococcygeal disorders, not elsewhere classified: Secondary | ICD-10-CM | POA: Diagnosis not present

## 2011-10-31 DIAGNOSIS — R1312 Dysphagia, oropharyngeal phase: Secondary | ICD-10-CM | POA: Diagnosis not present

## 2011-10-31 DIAGNOSIS — Z5189 Encounter for other specified aftercare: Secondary | ICD-10-CM | POA: Diagnosis not present

## 2011-11-01 DIAGNOSIS — I10 Essential (primary) hypertension: Secondary | ICD-10-CM | POA: Diagnosis not present

## 2011-11-01 DIAGNOSIS — E785 Hyperlipidemia, unspecified: Secondary | ICD-10-CM | POA: Diagnosis not present

## 2011-11-01 DIAGNOSIS — R5383 Other fatigue: Secondary | ICD-10-CM | POA: Diagnosis not present

## 2011-11-01 DIAGNOSIS — D518 Other vitamin B12 deficiency anemias: Secondary | ICD-10-CM | POA: Diagnosis not present

## 2011-11-01 DIAGNOSIS — R5381 Other malaise: Secondary | ICD-10-CM | POA: Diagnosis not present

## 2011-11-03 DIAGNOSIS — R002 Palpitations: Secondary | ICD-10-CM | POA: Diagnosis not present

## 2011-11-21 DIAGNOSIS — R5383 Other fatigue: Secondary | ICD-10-CM | POA: Diagnosis not present

## 2011-11-21 DIAGNOSIS — R5381 Other malaise: Secondary | ICD-10-CM | POA: Diagnosis not present

## 2011-11-21 DIAGNOSIS — C3490 Malignant neoplasm of unspecified part of unspecified bronchus or lung: Secondary | ICD-10-CM | POA: Diagnosis not present

## 2011-11-21 DIAGNOSIS — IMO0001 Reserved for inherently not codable concepts without codable children: Secondary | ICD-10-CM | POA: Diagnosis not present

## 2011-11-23 DIAGNOSIS — M5137 Other intervertebral disc degeneration, lumbosacral region: Secondary | ICD-10-CM | POA: Diagnosis not present

## 2011-11-23 DIAGNOSIS — M545 Low back pain: Secondary | ICD-10-CM | POA: Diagnosis not present

## 2011-12-06 DIAGNOSIS — R5381 Other malaise: Secondary | ICD-10-CM | POA: Diagnosis not present

## 2011-12-17 DIAGNOSIS — M5137 Other intervertebral disc degeneration, lumbosacral region: Secondary | ICD-10-CM | POA: Diagnosis not present

## 2011-12-17 DIAGNOSIS — Q762 Congenital spondylolisthesis: Secondary | ICD-10-CM | POA: Diagnosis not present

## 2011-12-18 DIAGNOSIS — J40 Bronchitis, not specified as acute or chronic: Secondary | ICD-10-CM | POA: Diagnosis not present

## 2011-12-19 DIAGNOSIS — M549 Dorsalgia, unspecified: Secondary | ICD-10-CM | POA: Diagnosis not present

## 2011-12-19 DIAGNOSIS — IMO0002 Reserved for concepts with insufficient information to code with codable children: Secondary | ICD-10-CM | POA: Diagnosis not present

## 2011-12-19 DIAGNOSIS — M5137 Other intervertebral disc degeneration, lumbosacral region: Secondary | ICD-10-CM | POA: Diagnosis not present

## 2011-12-24 DIAGNOSIS — Z85118 Personal history of other malignant neoplasm of bronchus and lung: Secondary | ICD-10-CM | POA: Diagnosis not present

## 2011-12-24 DIAGNOSIS — Z09 Encounter for follow-up examination after completed treatment for conditions other than malignant neoplasm: Secondary | ICD-10-CM | POA: Diagnosis not present

## 2011-12-24 DIAGNOSIS — G8929 Other chronic pain: Secondary | ICD-10-CM | POA: Diagnosis not present

## 2011-12-24 DIAGNOSIS — M4710 Other spondylosis with myelopathy, site unspecified: Secondary | ICD-10-CM | POA: Diagnosis not present

## 2011-12-24 DIAGNOSIS — D508 Other iron deficiency anemias: Secondary | ICD-10-CM | POA: Diagnosis not present

## 2011-12-26 DIAGNOSIS — L570 Actinic keratosis: Secondary | ICD-10-CM | POA: Diagnosis not present

## 2011-12-31 DIAGNOSIS — M5137 Other intervertebral disc degeneration, lumbosacral region: Secondary | ICD-10-CM | POA: Diagnosis not present

## 2011-12-31 DIAGNOSIS — G894 Chronic pain syndrome: Secondary | ICD-10-CM | POA: Diagnosis not present

## 2011-12-31 DIAGNOSIS — M546 Pain in thoracic spine: Secondary | ICD-10-CM | POA: Diagnosis not present

## 2012-01-01 DIAGNOSIS — I10 Essential (primary) hypertension: Secondary | ICD-10-CM | POA: Diagnosis not present

## 2012-01-04 DIAGNOSIS — R1314 Dysphagia, pharyngoesophageal phase: Secondary | ICD-10-CM | POA: Diagnosis not present

## 2012-01-07 DIAGNOSIS — R1314 Dysphagia, pharyngoesophageal phase: Secondary | ICD-10-CM | POA: Diagnosis not present

## 2012-01-07 DIAGNOSIS — M549 Dorsalgia, unspecified: Secondary | ICD-10-CM | POA: Diagnosis not present

## 2012-01-07 DIAGNOSIS — M4800 Spinal stenosis, site unspecified: Secondary | ICD-10-CM | POA: Diagnosis not present

## 2012-01-07 DIAGNOSIS — M6281 Muscle weakness (generalized): Secondary | ICD-10-CM | POA: Diagnosis not present

## 2012-01-07 DIAGNOSIS — M4120 Other idiopathic scoliosis, site unspecified: Secondary | ICD-10-CM | POA: Diagnosis not present

## 2012-01-07 DIAGNOSIS — Z5189 Encounter for other specified aftercare: Secondary | ICD-10-CM | POA: Diagnosis not present

## 2012-01-11 DIAGNOSIS — Z5189 Encounter for other specified aftercare: Secondary | ICD-10-CM | POA: Diagnosis not present

## 2012-01-11 DIAGNOSIS — M4120 Other idiopathic scoliosis, site unspecified: Secondary | ICD-10-CM | POA: Diagnosis not present

## 2012-01-11 DIAGNOSIS — M6281 Muscle weakness (generalized): Secondary | ICD-10-CM | POA: Diagnosis not present

## 2012-01-11 DIAGNOSIS — M4800 Spinal stenosis, site unspecified: Secondary | ICD-10-CM | POA: Diagnosis not present

## 2012-01-11 DIAGNOSIS — M549 Dorsalgia, unspecified: Secondary | ICD-10-CM | POA: Diagnosis not present

## 2012-01-11 DIAGNOSIS — R1314 Dysphagia, pharyngoesophageal phase: Secondary | ICD-10-CM | POA: Diagnosis not present

## 2012-01-15 DIAGNOSIS — M4120 Other idiopathic scoliosis, site unspecified: Secondary | ICD-10-CM | POA: Diagnosis not present

## 2012-01-15 DIAGNOSIS — M6281 Muscle weakness (generalized): Secondary | ICD-10-CM | POA: Diagnosis not present

## 2012-01-15 DIAGNOSIS — M549 Dorsalgia, unspecified: Secondary | ICD-10-CM | POA: Diagnosis not present

## 2012-01-15 DIAGNOSIS — M4800 Spinal stenosis, site unspecified: Secondary | ICD-10-CM | POA: Diagnosis not present

## 2012-01-15 DIAGNOSIS — Z5189 Encounter for other specified aftercare: Secondary | ICD-10-CM | POA: Diagnosis not present

## 2012-01-15 DIAGNOSIS — R1314 Dysphagia, pharyngoesophageal phase: Secondary | ICD-10-CM | POA: Diagnosis not present

## 2012-01-16 DIAGNOSIS — M549 Dorsalgia, unspecified: Secondary | ICD-10-CM | POA: Diagnosis not present

## 2012-01-16 DIAGNOSIS — R1314 Dysphagia, pharyngoesophageal phase: Secondary | ICD-10-CM | POA: Diagnosis not present

## 2012-01-16 DIAGNOSIS — Z5189 Encounter for other specified aftercare: Secondary | ICD-10-CM | POA: Diagnosis not present

## 2012-01-16 DIAGNOSIS — M4120 Other idiopathic scoliosis, site unspecified: Secondary | ICD-10-CM | POA: Diagnosis not present

## 2012-01-16 DIAGNOSIS — M4800 Spinal stenosis, site unspecified: Secondary | ICD-10-CM | POA: Diagnosis not present

## 2012-01-16 DIAGNOSIS — M6281 Muscle weakness (generalized): Secondary | ICD-10-CM | POA: Diagnosis not present

## 2012-01-17 DIAGNOSIS — M549 Dorsalgia, unspecified: Secondary | ICD-10-CM | POA: Diagnosis not present

## 2012-01-17 DIAGNOSIS — M6281 Muscle weakness (generalized): Secondary | ICD-10-CM | POA: Diagnosis not present

## 2012-01-17 DIAGNOSIS — M4800 Spinal stenosis, site unspecified: Secondary | ICD-10-CM | POA: Diagnosis not present

## 2012-01-17 DIAGNOSIS — Z5189 Encounter for other specified aftercare: Secondary | ICD-10-CM | POA: Diagnosis not present

## 2012-01-17 DIAGNOSIS — R1314 Dysphagia, pharyngoesophageal phase: Secondary | ICD-10-CM | POA: Diagnosis not present

## 2012-01-17 DIAGNOSIS — M4120 Other idiopathic scoliosis, site unspecified: Secondary | ICD-10-CM | POA: Diagnosis not present

## 2012-01-23 DIAGNOSIS — M549 Dorsalgia, unspecified: Secondary | ICD-10-CM | POA: Diagnosis not present

## 2012-01-23 DIAGNOSIS — R1314 Dysphagia, pharyngoesophageal phase: Secondary | ICD-10-CM | POA: Diagnosis not present

## 2012-01-23 DIAGNOSIS — M6281 Muscle weakness (generalized): Secondary | ICD-10-CM | POA: Diagnosis not present

## 2012-01-23 DIAGNOSIS — M4800 Spinal stenosis, site unspecified: Secondary | ICD-10-CM | POA: Diagnosis not present

## 2012-01-23 DIAGNOSIS — M4120 Other idiopathic scoliosis, site unspecified: Secondary | ICD-10-CM | POA: Diagnosis not present

## 2012-01-23 DIAGNOSIS — Z5189 Encounter for other specified aftercare: Secondary | ICD-10-CM | POA: Diagnosis not present

## 2012-01-28 DIAGNOSIS — L570 Actinic keratosis: Secondary | ICD-10-CM | POA: Diagnosis not present

## 2012-01-30 DIAGNOSIS — R1314 Dysphagia, pharyngoesophageal phase: Secondary | ICD-10-CM | POA: Diagnosis not present

## 2012-01-30 DIAGNOSIS — M549 Dorsalgia, unspecified: Secondary | ICD-10-CM | POA: Diagnosis not present

## 2012-01-30 DIAGNOSIS — M4800 Spinal stenosis, site unspecified: Secondary | ICD-10-CM | POA: Diagnosis not present

## 2012-01-30 DIAGNOSIS — Z5189 Encounter for other specified aftercare: Secondary | ICD-10-CM | POA: Diagnosis not present

## 2012-01-30 DIAGNOSIS — M4120 Other idiopathic scoliosis, site unspecified: Secondary | ICD-10-CM | POA: Diagnosis not present

## 2012-01-30 DIAGNOSIS — M6281 Muscle weakness (generalized): Secondary | ICD-10-CM | POA: Diagnosis not present

## 2012-02-07 DIAGNOSIS — G894 Chronic pain syndrome: Secondary | ICD-10-CM | POA: Diagnosis not present

## 2012-02-07 DIAGNOSIS — M5137 Other intervertebral disc degeneration, lumbosacral region: Secondary | ICD-10-CM | POA: Diagnosis not present

## 2012-02-07 DIAGNOSIS — Z23 Encounter for immunization: Secondary | ICD-10-CM | POA: Diagnosis not present

## 2012-02-19 DIAGNOSIS — R1310 Dysphagia, unspecified: Secondary | ICD-10-CM | POA: Diagnosis not present

## 2012-02-20 DIAGNOSIS — I6529 Occlusion and stenosis of unspecified carotid artery: Secondary | ICD-10-CM | POA: Diagnosis not present

## 2012-02-20 DIAGNOSIS — I2510 Atherosclerotic heart disease of native coronary artery without angina pectoris: Secondary | ICD-10-CM | POA: Diagnosis not present

## 2012-02-20 DIAGNOSIS — E785 Hyperlipidemia, unspecified: Secondary | ICD-10-CM | POA: Diagnosis not present

## 2012-02-20 DIAGNOSIS — I10 Essential (primary) hypertension: Secondary | ICD-10-CM | POA: Diagnosis not present

## 2012-03-05 DIAGNOSIS — I6529 Occlusion and stenosis of unspecified carotid artery: Secondary | ICD-10-CM | POA: Diagnosis not present

## 2012-03-31 DIAGNOSIS — L570 Actinic keratosis: Secondary | ICD-10-CM | POA: Diagnosis not present

## 2012-04-07 DIAGNOSIS — M545 Low back pain: Secondary | ICD-10-CM | POA: Diagnosis not present

## 2012-04-07 DIAGNOSIS — G894 Chronic pain syndrome: Secondary | ICD-10-CM | POA: Diagnosis not present

## 2012-04-09 DIAGNOSIS — I10 Essential (primary) hypertension: Secondary | ICD-10-CM | POA: Diagnosis not present

## 2012-04-17 DIAGNOSIS — J40 Bronchitis, not specified as acute or chronic: Secondary | ICD-10-CM | POA: Diagnosis not present

## 2012-04-17 DIAGNOSIS — J9600 Acute respiratory failure, unspecified whether with hypoxia or hypercapnia: Secondary | ICD-10-CM | POA: Diagnosis not present

## 2012-04-17 DIAGNOSIS — I10 Essential (primary) hypertension: Secondary | ICD-10-CM | POA: Diagnosis not present

## 2012-04-23 DIAGNOSIS — Z961 Presence of intraocular lens: Secondary | ICD-10-CM | POA: Diagnosis not present

## 2012-05-05 DIAGNOSIS — J209 Acute bronchitis, unspecified: Secondary | ICD-10-CM | POA: Diagnosis not present

## 2012-05-05 DIAGNOSIS — J45901 Unspecified asthma with (acute) exacerbation: Secondary | ICD-10-CM | POA: Diagnosis not present

## 2012-05-12 DIAGNOSIS — L570 Actinic keratosis: Secondary | ICD-10-CM | POA: Diagnosis not present

## 2012-05-22 DIAGNOSIS — M545 Low back pain: Secondary | ICD-10-CM | POA: Diagnosis not present

## 2012-05-22 DIAGNOSIS — G894 Chronic pain syndrome: Secondary | ICD-10-CM | POA: Diagnosis not present

## 2012-06-30 DIAGNOSIS — Z09 Encounter for follow-up examination after completed treatment for conditions other than malignant neoplasm: Secondary | ICD-10-CM | POA: Diagnosis not present

## 2012-06-30 DIAGNOSIS — Z85118 Personal history of other malignant neoplasm of bronchus and lung: Secondary | ICD-10-CM | POA: Diagnosis not present

## 2012-06-30 DIAGNOSIS — D509 Iron deficiency anemia, unspecified: Secondary | ICD-10-CM | POA: Diagnosis not present

## 2012-07-02 DIAGNOSIS — C3430 Malignant neoplasm of lower lobe, unspecified bronchus or lung: Secondary | ICD-10-CM | POA: Diagnosis not present

## 2012-07-02 DIAGNOSIS — D649 Anemia, unspecified: Secondary | ICD-10-CM | POA: Diagnosis not present

## 2012-07-03 DIAGNOSIS — C3430 Malignant neoplasm of lower lobe, unspecified bronchus or lung: Secondary | ICD-10-CM | POA: Diagnosis not present

## 2012-07-03 DIAGNOSIS — D649 Anemia, unspecified: Secondary | ICD-10-CM | POA: Diagnosis not present

## 2012-07-04 DIAGNOSIS — D509 Iron deficiency anemia, unspecified: Secondary | ICD-10-CM | POA: Diagnosis not present

## 2012-07-07 DIAGNOSIS — R970 Elevated carcinoembryonic antigen [CEA]: Secondary | ICD-10-CM | POA: Diagnosis not present

## 2012-07-07 DIAGNOSIS — R911 Solitary pulmonary nodule: Secondary | ICD-10-CM | POA: Diagnosis not present

## 2012-07-07 DIAGNOSIS — Z77090 Contact with and (suspected) exposure to asbestos: Secondary | ICD-10-CM | POA: Diagnosis not present

## 2012-07-07 DIAGNOSIS — C3490 Malignant neoplasm of unspecified part of unspecified bronchus or lung: Secondary | ICD-10-CM | POA: Diagnosis not present

## 2012-07-10 DIAGNOSIS — J984 Other disorders of lung: Secondary | ICD-10-CM | POA: Diagnosis not present

## 2012-07-10 DIAGNOSIS — I10 Essential (primary) hypertension: Secondary | ICD-10-CM | POA: Diagnosis not present

## 2012-07-17 DIAGNOSIS — G894 Chronic pain syndrome: Secondary | ICD-10-CM | POA: Diagnosis not present

## 2012-07-17 DIAGNOSIS — D509 Iron deficiency anemia, unspecified: Secondary | ICD-10-CM | POA: Diagnosis not present

## 2012-07-17 DIAGNOSIS — M545 Low back pain: Secondary | ICD-10-CM | POA: Diagnosis not present

## 2012-07-17 DIAGNOSIS — M79609 Pain in unspecified limb: Secondary | ICD-10-CM | POA: Diagnosis not present

## 2012-07-25 DIAGNOSIS — IMO0001 Reserved for inherently not codable concepts without codable children: Secondary | ICD-10-CM | POA: Diagnosis not present

## 2012-07-25 DIAGNOSIS — M2550 Pain in unspecified joint: Secondary | ICD-10-CM | POA: Diagnosis not present

## 2012-07-27 DIAGNOSIS — I959 Hypotension, unspecified: Secondary | ICD-10-CM | POA: Diagnosis not present

## 2012-07-27 DIAGNOSIS — R5381 Other malaise: Secondary | ICD-10-CM | POA: Diagnosis not present

## 2012-07-27 DIAGNOSIS — I509 Heart failure, unspecified: Secondary | ICD-10-CM | POA: Diagnosis not present

## 2012-07-27 DIAGNOSIS — R55 Syncope and collapse: Secondary | ICD-10-CM | POA: Diagnosis not present

## 2012-07-27 DIAGNOSIS — J449 Chronic obstructive pulmonary disease, unspecified: Secondary | ICD-10-CM | POA: Diagnosis not present

## 2012-07-27 DIAGNOSIS — R404 Transient alteration of awareness: Secondary | ICD-10-CM | POA: Diagnosis not present

## 2012-07-27 DIAGNOSIS — I951 Orthostatic hypotension: Secondary | ICD-10-CM | POA: Diagnosis not present

## 2012-08-04 DIAGNOSIS — R55 Syncope and collapse: Secondary | ICD-10-CM | POA: Diagnosis not present

## 2012-08-04 DIAGNOSIS — R040 Epistaxis: Secondary | ICD-10-CM | POA: Diagnosis not present

## 2012-08-04 DIAGNOSIS — I10 Essential (primary) hypertension: Secondary | ICD-10-CM | POA: Diagnosis not present

## 2012-08-14 DIAGNOSIS — R21 Rash and other nonspecific skin eruption: Secondary | ICD-10-CM | POA: Diagnosis not present

## 2012-08-14 DIAGNOSIS — D509 Iron deficiency anemia, unspecified: Secondary | ICD-10-CM | POA: Diagnosis not present

## 2012-08-14 DIAGNOSIS — R918 Other nonspecific abnormal finding of lung field: Secondary | ICD-10-CM | POA: Diagnosis not present

## 2012-08-14 DIAGNOSIS — Z09 Encounter for follow-up examination after completed treatment for conditions other than malignant neoplasm: Secondary | ICD-10-CM | POA: Diagnosis not present

## 2012-08-14 DIAGNOSIS — Z85118 Personal history of other malignant neoplasm of bronchus and lung: Secondary | ICD-10-CM | POA: Diagnosis not present

## 2012-08-15 DIAGNOSIS — C44319 Basal cell carcinoma of skin of other parts of face: Secondary | ICD-10-CM | POA: Diagnosis not present

## 2012-08-15 DIAGNOSIS — C4442 Squamous cell carcinoma of skin of scalp and neck: Secondary | ICD-10-CM | POA: Diagnosis not present

## 2012-08-15 DIAGNOSIS — L570 Actinic keratosis: Secondary | ICD-10-CM | POA: Diagnosis not present

## 2012-08-21 DIAGNOSIS — I2510 Atherosclerotic heart disease of native coronary artery without angina pectoris: Secondary | ICD-10-CM | POA: Diagnosis not present

## 2012-08-25 DIAGNOSIS — J209 Acute bronchitis, unspecified: Secondary | ICD-10-CM | POA: Diagnosis not present

## 2012-08-25 DIAGNOSIS — J45901 Unspecified asthma with (acute) exacerbation: Secondary | ICD-10-CM | POA: Diagnosis not present

## 2012-08-27 DIAGNOSIS — C44319 Basal cell carcinoma of skin of other parts of face: Secondary | ICD-10-CM | POA: Diagnosis not present

## 2012-09-03 DIAGNOSIS — I5030 Unspecified diastolic (congestive) heart failure: Secondary | ICD-10-CM | POA: Diagnosis not present

## 2012-09-03 DIAGNOSIS — E785 Hyperlipidemia, unspecified: Secondary | ICD-10-CM | POA: Diagnosis not present

## 2012-09-03 DIAGNOSIS — I10 Essential (primary) hypertension: Secondary | ICD-10-CM | POA: Diagnosis not present

## 2012-09-03 DIAGNOSIS — I2510 Atherosclerotic heart disease of native coronary artery without angina pectoris: Secondary | ICD-10-CM | POA: Diagnosis not present

## 2012-09-05 DIAGNOSIS — J309 Allergic rhinitis, unspecified: Secondary | ICD-10-CM | POA: Diagnosis not present

## 2012-09-05 DIAGNOSIS — J45901 Unspecified asthma with (acute) exacerbation: Secondary | ICD-10-CM | POA: Diagnosis not present

## 2012-09-11 DIAGNOSIS — M545 Low back pain: Secondary | ICD-10-CM | POA: Diagnosis not present

## 2012-09-11 DIAGNOSIS — M79609 Pain in unspecified limb: Secondary | ICD-10-CM | POA: Diagnosis not present

## 2012-09-11 DIAGNOSIS — G894 Chronic pain syndrome: Secondary | ICD-10-CM | POA: Diagnosis not present

## 2012-09-11 DIAGNOSIS — M5137 Other intervertebral disc degeneration, lumbosacral region: Secondary | ICD-10-CM | POA: Diagnosis not present

## 2012-10-10 DIAGNOSIS — I10 Essential (primary) hypertension: Secondary | ICD-10-CM | POA: Diagnosis not present

## 2012-10-10 DIAGNOSIS — Z1212 Encounter for screening for malignant neoplasm of rectum: Secondary | ICD-10-CM | POA: Diagnosis not present

## 2012-10-10 DIAGNOSIS — I2510 Atherosclerotic heart disease of native coronary artery without angina pectoris: Secondary | ICD-10-CM | POA: Diagnosis not present

## 2012-10-10 DIAGNOSIS — N400 Enlarged prostate without lower urinary tract symptoms: Secondary | ICD-10-CM | POA: Diagnosis not present

## 2012-10-10 DIAGNOSIS — E785 Hyperlipidemia, unspecified: Secondary | ICD-10-CM | POA: Diagnosis not present

## 2012-10-29 DIAGNOSIS — R091 Pleurisy: Secondary | ICD-10-CM | POA: Diagnosis not present

## 2012-10-29 DIAGNOSIS — R918 Other nonspecific abnormal finding of lung field: Secondary | ICD-10-CM | POA: Diagnosis not present

## 2012-10-29 DIAGNOSIS — C3490 Malignant neoplasm of unspecified part of unspecified bronchus or lung: Secondary | ICD-10-CM | POA: Diagnosis not present

## 2012-10-29 DIAGNOSIS — Z77090 Contact with and (suspected) exposure to asbestos: Secondary | ICD-10-CM | POA: Diagnosis not present

## 2012-10-29 DIAGNOSIS — J449 Chronic obstructive pulmonary disease, unspecified: Secondary | ICD-10-CM | POA: Diagnosis not present

## 2012-10-30 DIAGNOSIS — C3430 Malignant neoplasm of lower lobe, unspecified bronchus or lung: Secondary | ICD-10-CM | POA: Diagnosis not present

## 2012-10-30 DIAGNOSIS — D649 Anemia, unspecified: Secondary | ICD-10-CM | POA: Diagnosis not present

## 2012-10-30 DIAGNOSIS — Z09 Encounter for follow-up examination after completed treatment for conditions other than malignant neoplasm: Secondary | ICD-10-CM | POA: Diagnosis not present

## 2012-11-27 DIAGNOSIS — M5137 Other intervertebral disc degeneration, lumbosacral region: Secondary | ICD-10-CM | POA: Diagnosis not present

## 2012-11-27 DIAGNOSIS — G894 Chronic pain syndrome: Secondary | ICD-10-CM | POA: Diagnosis not present

## 2012-12-08 DIAGNOSIS — L570 Actinic keratosis: Secondary | ICD-10-CM | POA: Diagnosis not present

## 2012-12-22 DIAGNOSIS — M25519 Pain in unspecified shoulder: Secondary | ICD-10-CM | POA: Diagnosis not present

## 2012-12-29 DIAGNOSIS — C3430 Malignant neoplasm of lower lobe, unspecified bronchus or lung: Secondary | ICD-10-CM | POA: Diagnosis not present

## 2012-12-30 DIAGNOSIS — C3430 Malignant neoplasm of lower lobe, unspecified bronchus or lung: Secondary | ICD-10-CM | POA: Diagnosis not present

## 2012-12-31 DIAGNOSIS — Z85118 Personal history of other malignant neoplasm of bronchus and lung: Secondary | ICD-10-CM | POA: Diagnosis not present

## 2012-12-31 DIAGNOSIS — J61 Pneumoconiosis due to asbestos and other mineral fibers: Secondary | ICD-10-CM | POA: Diagnosis not present

## 2012-12-31 DIAGNOSIS — R970 Elevated carcinoembryonic antigen [CEA]: Secondary | ICD-10-CM | POA: Diagnosis not present

## 2012-12-31 DIAGNOSIS — D509 Iron deficiency anemia, unspecified: Secondary | ICD-10-CM | POA: Diagnosis not present

## 2013-01-08 DIAGNOSIS — J45909 Unspecified asthma, uncomplicated: Secondary | ICD-10-CM | POA: Diagnosis not present

## 2013-01-23 DIAGNOSIS — R1310 Dysphagia, unspecified: Secondary | ICD-10-CM | POA: Diagnosis not present

## 2013-01-23 DIAGNOSIS — K219 Gastro-esophageal reflux disease without esophagitis: Secondary | ICD-10-CM | POA: Diagnosis not present

## 2013-01-27 ENCOUNTER — Encounter: Payer: Self-pay | Admitting: Gastroenterology

## 2013-01-27 DIAGNOSIS — K224 Dyskinesia of esophagus: Secondary | ICD-10-CM | POA: Diagnosis not present

## 2013-01-27 DIAGNOSIS — R1310 Dysphagia, unspecified: Secondary | ICD-10-CM | POA: Diagnosis not present

## 2013-01-27 DIAGNOSIS — K449 Diaphragmatic hernia without obstruction or gangrene: Secondary | ICD-10-CM | POA: Diagnosis not present

## 2013-01-30 DIAGNOSIS — Z23 Encounter for immunization: Secondary | ICD-10-CM | POA: Diagnosis not present

## 2013-02-18 DIAGNOSIS — R0602 Shortness of breath: Secondary | ICD-10-CM | POA: Diagnosis not present

## 2013-02-18 DIAGNOSIS — J45909 Unspecified asthma, uncomplicated: Secondary | ICD-10-CM | POA: Diagnosis not present

## 2013-02-18 DIAGNOSIS — J984 Other disorders of lung: Secondary | ICD-10-CM | POA: Diagnosis not present

## 2013-02-20 DIAGNOSIS — J45901 Unspecified asthma with (acute) exacerbation: Secondary | ICD-10-CM | POA: Diagnosis not present

## 2013-02-20 DIAGNOSIS — R0602 Shortness of breath: Secondary | ICD-10-CM | POA: Diagnosis not present

## 2013-02-25 ENCOUNTER — Encounter: Payer: Self-pay | Admitting: Gastroenterology

## 2013-02-25 DIAGNOSIS — Z85118 Personal history of other malignant neoplasm of bronchus and lung: Secondary | ICD-10-CM | POA: Diagnosis not present

## 2013-02-25 DIAGNOSIS — Z7982 Long term (current) use of aspirin: Secondary | ICD-10-CM | POA: Diagnosis not present

## 2013-02-25 DIAGNOSIS — I69991 Dysphagia following unspecified cerebrovascular disease: Secondary | ICD-10-CM | POA: Diagnosis not present

## 2013-02-25 DIAGNOSIS — K449 Diaphragmatic hernia without obstruction or gangrene: Secondary | ICD-10-CM | POA: Diagnosis not present

## 2013-02-25 DIAGNOSIS — K222 Esophageal obstruction: Secondary | ICD-10-CM | POA: Diagnosis not present

## 2013-02-25 DIAGNOSIS — K219 Gastro-esophageal reflux disease without esophagitis: Secondary | ICD-10-CM | POA: Diagnosis not present

## 2013-02-25 DIAGNOSIS — R1313 Dysphagia, pharyngeal phase: Secondary | ICD-10-CM | POA: Diagnosis not present

## 2013-02-25 DIAGNOSIS — J449 Chronic obstructive pulmonary disease, unspecified: Secondary | ICD-10-CM | POA: Diagnosis not present

## 2013-02-25 DIAGNOSIS — Z79899 Other long term (current) drug therapy: Secondary | ICD-10-CM | POA: Diagnosis not present

## 2013-02-25 DIAGNOSIS — F17200 Nicotine dependence, unspecified, uncomplicated: Secondary | ICD-10-CM | POA: Diagnosis not present

## 2013-02-25 DIAGNOSIS — R1310 Dysphagia, unspecified: Secondary | ICD-10-CM | POA: Diagnosis not present

## 2013-02-25 DIAGNOSIS — E7800 Pure hypercholesterolemia, unspecified: Secondary | ICD-10-CM | POA: Diagnosis not present

## 2013-02-25 DIAGNOSIS — E039 Hypothyroidism, unspecified: Secondary | ICD-10-CM | POA: Diagnosis not present

## 2013-02-25 DIAGNOSIS — E785 Hyperlipidemia, unspecified: Secondary | ICD-10-CM | POA: Diagnosis not present

## 2013-02-25 DIAGNOSIS — K2289 Other specified disease of esophagus: Secondary | ICD-10-CM | POA: Diagnosis not present

## 2013-02-25 DIAGNOSIS — I10 Essential (primary) hypertension: Secondary | ICD-10-CM | POA: Diagnosis not present

## 2013-02-25 HISTORY — PX: UPPER GI ENDOSCOPY: SHX6162

## 2013-03-02 ENCOUNTER — Encounter: Payer: Self-pay | Admitting: Cardiovascular Disease

## 2013-03-02 ENCOUNTER — Ambulatory Visit (INDEPENDENT_AMBULATORY_CARE_PROVIDER_SITE_OTHER): Payer: Medicare Other | Admitting: Cardiovascular Disease

## 2013-03-02 VITALS — BP 168/90 | HR 72 | Ht 68.0 in | Wt 157.0 lb

## 2013-03-02 DIAGNOSIS — I2510 Atherosclerotic heart disease of native coronary artery without angina pectoris: Secondary | ICD-10-CM

## 2013-03-02 DIAGNOSIS — I779 Disorder of arteries and arterioles, unspecified: Secondary | ICD-10-CM

## 2013-03-02 DIAGNOSIS — I739 Peripheral vascular disease, unspecified: Secondary | ICD-10-CM | POA: Diagnosis not present

## 2013-03-02 DIAGNOSIS — R079 Chest pain, unspecified: Secondary | ICD-10-CM | POA: Diagnosis not present

## 2013-03-02 DIAGNOSIS — E785 Hyperlipidemia, unspecified: Secondary | ICD-10-CM

## 2013-03-02 NOTE — Assessment & Plan Note (Addendum)
History of stenting in 1999 and potentially 2004 by Dr. Charlies Constable. His left heart catheterization performed in 2011 revealed a 50% mid LAD, 50-70% ostial circumflex. Medical therapy was recommended. He had an episode of back pain with dyspnea on exertion 40s ago and was not seen in the emergency room. He saw Dr. Michel Santee Eyk today in the office who referred him here for further evaluation.I am going to get an exercise Myoview stress test on him as well as a 2-D echocardiogram.

## 2013-03-02 NOTE — Assessment & Plan Note (Signed)
Statin intolerant with recent lipid profile performed today revealing total cholesterol 268, LDL of 195 HDL of 53.

## 2013-03-02 NOTE — Progress Notes (Signed)
03/02/2013 Glenn Reilly   [birth year]  [id number]  Primary Physician Pcp Not In System Primary Cardiologist: Runell Gess MD Roseanne Reno   HPI:  Mr. Camacho is an 79 year old thin appearing widowed Caucasian male father of one son, grandfather to prevent children who accompanies him today. He was referred by Dr. Michel Santee Eyk who saw him today because of a recent episode of chest pain and dyspnea. His cardiac risk factor profile is remarkable for remote tobacco abuse, hyperlipidemia and hypertension. He has never had a heart attack but did have a stroke in 2011 at the time of left carotid endarterectomy. He said coronary artery disease with stenting in 1999 performed by Dr. Charlies Constable and again 2004. His left calf because k patent stent with moderate LAD and circumflex disease. He does have diastolic dysfunction. He has had lung cancer and right lung resection by Dr. Dewayne Shorter in 2001. On Thursday night after mowing his lawn on a riding lower developed back pain rating to his chest associated with dyspnea on exertion. This resolved throughout the night has not recurred. He did have apparently inferior T-wave inversion on EKG today which was new.   Current Outpatient Prescriptions  Medication Sig Dispense Refill  . ALBUTEROL SULFATE IN Inhale 3 mg into the lungs as needed.      . furosemide (LASIX) 20 MG tablet Take 20 mg by mouth daily.      Marland Kitchen ipratropium (ATROVENT) 0.02 % nebulizer solution Take 500 mcg by nebulization daily.      Marland Kitchen levothyroxine (SYNTHROID, LEVOTHROID) 88 MCG tablet Take 88 mcg by mouth daily before breakfast.      . lisinopril (PRINIVIL,ZESTRIL) 40 MG tablet Take 40 mg by mouth daily.      . methyldopa (ALDOMET) 250 MG tablet Take 250 mg by mouth 2 (two) times daily.      . predniSONE (DELTASONE) 10 MG tablet Take 10 mg by mouth daily.       No current facility-administered medications for this visit.    Allergies  Allergen Reactions  .  Demerol [Meperidine]   . Meperidine Hcl     History   Social History  . Marital Status: Widowed    Spouse Name: N/A    Number of Children: N/A  . Years of Education: N/A   Occupational History  . Not on file.   Social History Main Topics  . Smoking status: Former Smoker    Types: Cigarettes    Quit date: 03/02/1973  . Smokeless tobacco: Current User    Types: Chew  . Alcohol Use: Not on file  . Drug Use: Not on file  . Sexual Activity: Not on file   Other Topics Concern  . Not on file   Social History Narrative  . No narrative on file     Review of Systems: General: negative for chills, fever, night sweats or weight changes.  Cardiovascular: negative for chest pain, dyspnea on exertion, edema, orthopnea, palpitations, paroxysmal nocturnal dyspnea or shortness of breath Dermatological: negative for rash Respiratory: negative for cough or wheezing Urologic: negative for hematuria Abdominal: negative for nausea, vomiting, diarrhea, bright red blood per rectum, melena, or hematemesis Neurologic: negative for visual changes, syncope, or dizziness All other systems reviewed and are otherwise negative except as noted above.    Blood pressure 168/90, pulse 72, height 5\' 8"  (1.727 m), weight 157 lb (71.215 kg).  General appearance: alert and no distress Neck: no adenopathy, no JVD, supple, symmetrical,  trachea midline, thyroid not enlarged, symmetric, no tenderness/mass/nodules and right carotid bruit Lungs: clear to auscultation bilaterally Heart: regular rate and rhythm, S1, S2 normal, no murmur, click, rub or gallop Abdomen: soft, non-tender; bowel sounds normal; no masses,  no organomegaly Extremities: extremities normal, atraumatic, no cyanosis or edema Pulses: 2+ and symmetric  EKG /61 with nonspecific ST and T wave changes  ASSESSMENT AND PLAN:   CAD, NATIVE VESSEL History of stenting in 1999 and potentially 2004 by Dr. Charlies Constable. His left heart  catheterization performed in 2011 revealed a 50% mid LAD, 50-70% ostial circumflex. Medical therapy was recommended. He had an episode of back pain with dyspnea on exertion 40s ago and was not seen in the emergency room. He saw Dr. Michel Santee Eyk today in the office who referred him here for further evaluation.  HYPERLIPIDEMIA-MIXED Statin intolerant with recent lipid profile performed today revealing total cholesterol 268, LDL of 195 HDL of 53.  Carotid artery disease Post left carotid endarterectomy performed by a vascular surgeon in [facility] 2011. 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 He probably had a 40% right internal carotid artery stenosis is at that time. Dopplers were performed in January of this year showed no significant worsening of his stenosis.  PVD The patient had bilateral iliac stents placed in [place] at the time of his carotid endarterectomy.      Runell Gess MD FACP,FACC,FAHA, [facility] 03/02/2013 4:36 PM

## 2013-03-02 NOTE — Assessment & Plan Note (Addendum)
The patient had bilateral iliac stents placed in [place] at the time of his carotid endarterectomy.I M. Going to obtain baseline lower extremity arterial Doppler studies on him

## 2013-03-02 NOTE — Assessment & Plan Note (Signed)
Post left carotid endarterectomy performed by a vascular surgeon in [facility] 2011. 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 He probably had a 40% right internal carotid artery stenosis is at that time. Dopplers were performed in January of this year showed no significant worsening of his stenosis.

## 2013-03-02 NOTE — Patient Instructions (Signed)
  We will see you back in follow up after your tests have been done.   Dr Allyson Sabal has ordered a exercise stress test, an echocardiogram, and lower extremity arterial dopplers to be done prior to your next visit.  Dr Allyson Sabal has also placed an order for repeat carotid dopplers to be done in January 2015.

## 2013-03-03 ENCOUNTER — Encounter: Payer: Self-pay | Admitting: Cardiovascular Disease

## 2013-03-11 ENCOUNTER — Ambulatory Visit (HOSPITAL_COMMUNITY)
Admission: RE | Admit: 2013-03-11 | Discharge: 2013-03-11 | Disposition: A | Discharge status: Home / Self Care | Payer: Medicare Other | Source: Ambulatory Visit | Attending: Cardiovascular Disease | Admitting: Cardiovascular Disease

## 2013-03-11 DIAGNOSIS — R079 Chest pain, unspecified: Secondary | ICD-10-CM | POA: Diagnosis not present

## 2013-03-11 DIAGNOSIS — Z8673 Personal history of transient ischemic attack (TIA), and cerebral infarction without residual deficits: Secondary | ICD-10-CM | POA: Insufficient documentation

## 2013-03-11 DIAGNOSIS — R0602 Shortness of breath: Secondary | ICD-10-CM | POA: Diagnosis not present

## 2013-03-11 DIAGNOSIS — E663 Overweight: Secondary | ICD-10-CM | POA: Diagnosis not present

## 2013-03-11 DIAGNOSIS — R0989 Other specified symptoms and signs involving the circulatory and respiratory systems: Secondary | ICD-10-CM | POA: Insufficient documentation

## 2013-03-11 DIAGNOSIS — Z87891 Personal history of nicotine dependence: Secondary | ICD-10-CM | POA: Insufficient documentation

## 2013-03-11 DIAGNOSIS — I779 Disorder of arteries and arterioles, unspecified: Secondary | ICD-10-CM | POA: Diagnosis not present

## 2013-03-11 DIAGNOSIS — R0609 Other forms of dyspnea: Secondary | ICD-10-CM | POA: Diagnosis not present

## 2013-03-11 DIAGNOSIS — R002 Palpitations: Secondary | ICD-10-CM | POA: Diagnosis not present

## 2013-03-11 DIAGNOSIS — I2510 Atherosclerotic heart disease of native coronary artery without angina pectoris: Secondary | ICD-10-CM | POA: Insufficient documentation

## 2013-03-11 DIAGNOSIS — I10 Essential (primary) hypertension: Secondary | ICD-10-CM | POA: Diagnosis not present

## 2013-03-11 DIAGNOSIS — J438 Other emphysema: Secondary | ICD-10-CM | POA: Insufficient documentation

## 2013-03-11 DIAGNOSIS — R9431 Abnormal electrocardiogram [ECG] [EKG]: Secondary | ICD-10-CM | POA: Diagnosis not present

## 2013-03-11 DIAGNOSIS — Z8249 Family history of ischemic heart disease and other diseases of the circulatory system: Secondary | ICD-10-CM | POA: Diagnosis not present

## 2013-03-11 DIAGNOSIS — I739 Peripheral vascular disease, unspecified: Secondary | ICD-10-CM | POA: Insufficient documentation

## 2013-03-11 MED ORDER — AMINOPHYLLINE 25 MG/ML IV SOLN
125.0000 mg | Freq: Once | INTRAVENOUS | Status: AC
  Administered 2013-03-11: 125 mg via INTRAVENOUS

## 2013-03-11 MED ORDER — REGADENOSON 0.4 MG/5ML IV SOLN
0.4000 mg | Freq: Once | INTRAVENOUS | Status: AC
  Administered 2013-03-11: 0.4 mg via INTRAVENOUS

## 2013-03-11 MED ORDER — TECHNETIUM TC 99M SESTAMIBI GENERIC - CARDIOLITE
31.8000 | Freq: Once | INTRAVENOUS | Status: AC | PRN
  Administered 2013-03-11: 31.8 via INTRAVENOUS

## 2013-03-11 MED ORDER — TECHNETIUM TC 99M SESTAMIBI GENERIC - CARDIOLITE
9.8000 | Freq: Once | INTRAVENOUS | Status: AC | PRN
  Administered 2013-03-11: 10 via INTRAVENOUS

## 2013-03-11 NOTE — Procedures (Addendum)
[facility] [address] [place] [address] [phone]  Cardiology Nuclear Med Study  JKWON TREPTOW is a 79 y.o. male     MRN : [id number]     DOB: 03-04-1929  Procedure Date: 03/11/2013  Nuclear Med Background Indication for Stress Test:  Evaluation for Ischemia, Stent Patency and Abnormal EKG History:  Asthma, COPD, Emphysema and CAD;STENT X3--1999 AND 2004 Cardiac Risk Factors: Carotid Disease, CVA, Family History - CAD, History of Smoking, Hypertension, Lipids, Overweight and PVD  Symptoms:  Chest Pain, DOE, Palpitations and SOB   Nuclear Pre-Procedure Caffeine/Decaff Intake:  7:00pm NPO After: 5:00am   IV Site: R Hand  IV 0.9% NS with Angio Cath:  22g  Chest Size (in):  40"  IV Started by: Emmit Pomfret, RN  Height: 5\' 8"  (1.727 m)  Cup Size: n/a  BMI:  Body mass index is 23.88 kg/(m^2). Weight:  157 lb (71.215 kg)   Tech Comments:  Changed to Lexiscan per PT request due to back pain.    Nuclear Med Study 1 or 2 day study: 1 day  Stress Test Type:  Lexiscan  Order Authorizing Provider:  Nanetta Batty, MD   Resting Radionuclide: Technetium 33m Sestamibi  Resting Radionuclide Dose: 9.8 mCi   Stress Radionuclide:  Technetium 59m Sestamibi  Stress Radionuclide Dose: 31.8 mCi           Stress Protocol Rest HR:58 Stress HR: 70  Rest BP:197/104 Stress BP:197/104  Exercise Time (min): n/a METS: n/a          Dose of Adenosine (mg):  n/a Dose of Lexiscan: 0.4 mg  Dose of Atropine (mg): n/a Dose of Dobutamine: n/a mcg/kg/min (at max HR)  Stress Test Technologist: Ernestene Mention, CCT Nuclear Technologist: Gonzella Lex, CNMT   Rest Procedure:  Myocardial perfusion imaging was performed at rest 45 minutes following the intravenous administration of Technetium 44m Sestamibi. Stress Procedure:  The patient received IV Lexiscan 0.4 mg over 15-seconds.  Technetium 66m Sestamibi injected at 30-seconds.  Due to  patient's shortness of breath and stomach pains, he was given IV Aminophylline 125 mg. Symptoms were resolved during recovery. There were no significant changes with Lexiscan.  Quantitative spect images were obtained after a 45 minute delay.  Transient Ischemic Dilatation (Normal <1.22):  1.04 Lung/Heart Ratio (Normal <0.45):  0.29 QGS EDV:  96 ml QGS ESV:  41 ml LV Ejection Fraction: 58%      Rest ECG: NSR-LVH  Stress ECG: No significant change from baseline ECG  QPS Raw Data Images:  Normal; no motion artifact; normal heart/lung ratio. Stress Images:  Normal homogeneous uptake in all areas of the myocardium. Rest Images:  Normal homogeneous uptake in all areas of the myocardium. Subtraction (SDS):  No evidence of ischemia. LV Wall Motion:  NL LV Function; NL Wall Motion  Impression Exercise Capacity:  Lexiscan with no exercise. BP Response:  Normal blood pressure response. Clinical Symptoms:  No significant symptoms noted. ECG Impression:  No significant ST segment change suggestive of ischemia. Comparison with Prior Nuclear Study: No images to compare   Overall Impression:  Normal stress nuclear study.   Thurmon Fair, MD  03/11/2013 12:36 PM

## 2013-03-16 DIAGNOSIS — G894 Chronic pain syndrome: Secondary | ICD-10-CM | POA: Diagnosis not present

## 2013-03-16 DIAGNOSIS — M5137 Other intervertebral disc degeneration, lumbosacral region: Secondary | ICD-10-CM | POA: Diagnosis not present

## 2013-03-16 DIAGNOSIS — Z79899 Other long term (current) drug therapy: Secondary | ICD-10-CM | POA: Diagnosis not present

## 2013-03-16 DIAGNOSIS — Z5181 Encounter for therapeutic drug level monitoring: Secondary | ICD-10-CM | POA: Diagnosis not present

## 2013-03-24 ENCOUNTER — Ambulatory Visit (HOSPITAL_COMMUNITY)
Admission: RE | Admit: 2013-03-24 | Discharge: 2013-03-24 | Disposition: A | Discharge status: Home / Self Care | Payer: Medicare Other | Source: Ambulatory Visit | Attending: Cardiovascular Disease | Admitting: Cardiovascular Disease

## 2013-03-24 DIAGNOSIS — I739 Peripheral vascular disease, unspecified: Secondary | ICD-10-CM | POA: Insufficient documentation

## 2013-03-24 DIAGNOSIS — I70219 Atherosclerosis of native arteries of extremities with intermittent claudication, unspecified extremity: Secondary | ICD-10-CM

## 2013-03-24 DIAGNOSIS — R079 Chest pain, unspecified: Secondary | ICD-10-CM

## 2013-03-24 DIAGNOSIS — I2510 Atherosclerotic heart disease of native coronary artery without angina pectoris: Secondary | ICD-10-CM | POA: Insufficient documentation

## 2013-03-24 NOTE — Progress Notes (Signed)
Arterial Duplex Lower Ext. Completed. Samatha Anspach, BS, RDMS, RVT  

## 2013-03-24 NOTE — Progress Notes (Signed)
2D Echo Performed 03/24/2013    Jaselynn Tamas, RCS  

## 2013-03-25 ENCOUNTER — Encounter: Payer: Self-pay | Admitting: *Deleted

## 2013-04-01 ENCOUNTER — Ambulatory Visit (INDEPENDENT_AMBULATORY_CARE_PROVIDER_SITE_OTHER): Payer: Medicare Other | Admitting: Cardiovascular Disease

## 2013-04-01 ENCOUNTER — Encounter: Payer: Self-pay | Admitting: Cardiovascular Disease

## 2013-04-01 VITALS — BP 130/78 | HR 64 | Ht 68.0 in | Wt 155.7 lb

## 2013-04-01 DIAGNOSIS — I2510 Atherosclerotic heart disease of native coronary artery without angina pectoris: Secondary | ICD-10-CM

## 2013-04-01 DIAGNOSIS — E785 Hyperlipidemia, unspecified: Secondary | ICD-10-CM | POA: Diagnosis not present

## 2013-04-01 DIAGNOSIS — I739 Peripheral vascular disease, unspecified: Secondary | ICD-10-CM | POA: Diagnosis not present

## 2013-04-01 DIAGNOSIS — I10 Essential (primary) hypertension: Secondary | ICD-10-CM

## 2013-04-01 NOTE — Assessment & Plan Note (Signed)
Statin intolerance, followed by his PCP

## 2013-04-01 NOTE — Assessment & Plan Note (Signed)
Status post iliac stenting back in 2011. He denies claudication. Recent arterial Doppler studies performed 03/24/13 revealed ABIs of greater than 1 bilaterally with moderate we elevated right and mildly elevated left common iliac artery velocities suggesting potential for in-stent restenosis though at this point this is not clinically relevant.

## 2013-04-01 NOTE — Patient Instructions (Signed)
Your physician wants you to follow-up in: 6 months with Dr Berry. You will receive a reminder letter in the mail two months in advance. If you don't receive a letter, please call our office to schedule the follow-up appointment.  

## 2013-04-01 NOTE — Assessment & Plan Note (Signed)
Under good control on current medications 

## 2013-04-01 NOTE — Assessment & Plan Note (Signed)
He has had no recurrent chest pain since I saw him in the office 3 weeks ago. A Myoview stress test was entirely normal. A 2-D echo likewise showed normal LV function without segmental wall motion abnormalities and normal valvular function.

## 2013-04-01 NOTE — Progress Notes (Signed)
04/01/2013 Genene Churn Reas   Jul 14, 1928  [id number]  Primary Physician Pcp Not In System Primary Cardiologist: Runell Gess MD Roseanne Reno   HPI:  *Mr. Dauphinais is an 79 year old thin appearing widowed Caucasian male father of one son who accompanies him today. He was referred by Dr. Michel Santee Eyk who saw him today because of a recent episode of chest pain and dyspnea. His cardiac risk factor profile is remarkable for remote tobacco abuse, hyperlipidemia and hypertension. He has never had a heart attack but did have a stroke in 2011 at the time of left carotid endarterectomy. He said coronary artery disease with stenting in 1999 performed by Dr. Charlies Constable and again 2004. His left calf because k patent stent with moderate LAD and circumflex disease. He does have diastolic dysfunction. He has had lung cancer and right lung resection by Dr. Dewayne Shorter in 2001. On Thursday night after mowing his lawn on a riding lower developed back pain rating to his chest associated with dyspnea on exertion. This resolved throughout the night has not recurred. He did have apparently inferior T-wave inversion on EKG today which was new. Since I saw him in the office 2 weeks ago he's had no recurrent symptoms. A Myoview stress test was normal as was a 2-D echo cardiogram. Lower HMA arterial Doppler studies showed ankle-brachial indices of greater than one bilaterally with a suggestion of mild to moderate iliac disease    Current Outpatient Prescriptions  Medication Sig Dispense Refill  . ALBUTEROL SULFATE IN Inhale 3 mg into the lungs as needed.      . furosemide (LASIX) 20 MG tablet Take 20 mg by mouth daily.      Marland Kitchen ipratropium (ATROVENT) 0.02 % nebulizer solution Take 500 mcg by nebulization daily.      Marland Kitchen levothyroxine (SYNTHROID, LEVOTHROID) 88 MCG tablet Take 88 mcg by mouth daily before breakfast.      . lisinopril (PRINIVIL,ZESTRIL) 40 MG tablet Take 40 mg by mouth daily.      .  methyldopa (ALDOMET) 250 MG tablet Take 250 mg by mouth 2 (two) times daily.      . predniSONE (DELTASONE) 10 MG tablet Take 10 mg by mouth daily.      . fluticasone (FLONASE) 50 MCG/ACT nasal spray as needed.      Marland Kitchen omeprazole (PRILOSEC) 20 MG capsule Take 20 mg by mouth daily.       No current facility-administered medications for this visit.    Allergies  Allergen Reactions  . Demerol [Meperidine]   . Meperidine Hcl     History   Social History  . Marital Status: Widowed    Spouse Name: N/A    Number of Children: N/A  . Years of Education: N/A   Occupational History  . Not on file.   Social History Main Topics  . Smoking status: Former Smoker    Types: Cigarettes    Quit date: 03/02/1973  . Smokeless tobacco: Current User    Types: Chew  . Alcohol Use: Not on file  . Drug Use: Not on file  . Sexual Activity: Not on file   Other Topics Concern  . Not on file   Social History Narrative  . No narrative on file     Review of Systems: General: negative for chills, fever, night sweats or weight changes.  Cardiovascular: negative for chest pain, dyspnea on exertion, edema, orthopnea, palpitations, paroxysmal nocturnal dyspnea or shortness of breath Dermatological: negative for  rash Respiratory: negative for cough or wheezing Urologic: negative for hematuria Abdominal: negative for nausea, vomiting, diarrhea, bright red blood per rectum, melena, or hematemesis Neurologic: negative for visual changes, syncope, or dizziness All other systems reviewed and are otherwise negative except as noted above.    Blood pressure 130/78, pulse 64, height 5\' 8"  (1.727 m), weight 155 lb 11.2 oz (70.625 kg).  General appearance: alert and no distress Neck: no adenopathy, no carotid bruit, no JVD, supple, symmetrical, trachea midline and thyroid not enlarged, symmetric, no tenderness/mass/nodules Lungs: clear to auscultation bilaterally Heart: regular rate and rhythm, S1, S2 normal,  no murmur, click, rub or gallop Extremities: extremities normal, atraumatic, no cyanosis or edema  EKG not performed today  ASSESSMENT AND PLAN:   CAD, NATIVE VESSEL He has had no recurrent chest pain since I saw him in the office 3 weeks ago. A Myoview stress test was entirely normal. A 2-D echo likewise showed normal LV function without segmental wall motion abnormalities and normal valvular function.  HYPERTENSION, BENIGN Under good control on current medications  HYPERLIPIDEMIA-MIXED Statin intolerance, followed by his PCP  PVD Status post iliac stenting back in 2011. He denies claudication. Recent arterial Doppler studies performed 03/24/13 revealed ABIs of greater than 1 bilaterally with moderate we elevated right and mildly elevated left common iliac artery velocities suggesting potential for in-stent restenosis though at this point this is not clinically relevant.      Runell Gess MD FACP,FACC,FAHA, [facility] 04/01/2013 11:45 AM

## 2013-04-06 DIAGNOSIS — Z961 Presence of intraocular lens: Secondary | ICD-10-CM | POA: Diagnosis not present

## 2013-04-10 DIAGNOSIS — D649 Anemia, unspecified: Secondary | ICD-10-CM | POA: Diagnosis not present

## 2013-04-10 DIAGNOSIS — C3430 Malignant neoplasm of lower lobe, unspecified bronchus or lung: Secondary | ICD-10-CM | POA: Diagnosis not present

## 2013-04-10 DIAGNOSIS — Z09 Encounter for follow-up examination after completed treatment for conditions other than malignant neoplasm: Secondary | ICD-10-CM | POA: Diagnosis not present

## 2013-04-16 DIAGNOSIS — E785 Hyperlipidemia, unspecified: Secondary | ICD-10-CM | POA: Diagnosis not present

## 2013-04-16 DIAGNOSIS — IMO0001 Reserved for inherently not codable concepts without codable children: Secondary | ICD-10-CM | POA: Diagnosis not present

## 2013-04-16 DIAGNOSIS — I2510 Atherosclerotic heart disease of native coronary artery without angina pectoris: Secondary | ICD-10-CM | POA: Diagnosis not present

## 2013-04-16 DIAGNOSIS — I10 Essential (primary) hypertension: Secondary | ICD-10-CM | POA: Diagnosis not present

## 2013-05-12 DIAGNOSIS — L259 Unspecified contact dermatitis, unspecified cause: Secondary | ICD-10-CM | POA: Diagnosis not present

## 2013-05-12 DIAGNOSIS — L570 Actinic keratosis: Secondary | ICD-10-CM | POA: Diagnosis not present

## 2013-05-12 DIAGNOSIS — L981 Factitial dermatitis: Secondary | ICD-10-CM | POA: Diagnosis not present

## 2013-05-20 DIAGNOSIS — J018 Other acute sinusitis: Secondary | ICD-10-CM | POA: Diagnosis not present

## 2013-06-04 DIAGNOSIS — J45901 Unspecified asthma with (acute) exacerbation: Secondary | ICD-10-CM | POA: Diagnosis not present

## 2013-07-14 DIAGNOSIS — C3430 Malignant neoplasm of lower lobe, unspecified bronchus or lung: Secondary | ICD-10-CM | POA: Diagnosis not present

## 2013-07-14 DIAGNOSIS — I700 Atherosclerosis of aorta: Secondary | ICD-10-CM | POA: Diagnosis not present

## 2013-07-14 DIAGNOSIS — C3490 Malignant neoplasm of unspecified part of unspecified bronchus or lung: Secondary | ICD-10-CM | POA: Diagnosis not present

## 2013-07-16 DIAGNOSIS — E785 Hyperlipidemia, unspecified: Secondary | ICD-10-CM | POA: Diagnosis not present

## 2013-07-16 DIAGNOSIS — R918 Other nonspecific abnormal finding of lung field: Secondary | ICD-10-CM | POA: Diagnosis not present

## 2013-07-16 DIAGNOSIS — R0602 Shortness of breath: Secondary | ICD-10-CM | POA: Diagnosis not present

## 2013-07-16 DIAGNOSIS — Z85118 Personal history of other malignant neoplasm of bronchus and lung: Secondary | ICD-10-CM | POA: Diagnosis not present

## 2013-07-31 DIAGNOSIS — IMO0001 Reserved for inherently not codable concepts without codable children: Secondary | ICD-10-CM | POA: Diagnosis not present

## 2013-08-03 DIAGNOSIS — H5200 Hypermetropia, unspecified eye: Secondary | ICD-10-CM | POA: Diagnosis not present

## 2013-08-03 DIAGNOSIS — H47099 Other disorders of optic nerve, not elsewhere classified, unspecified eye: Secondary | ICD-10-CM | POA: Diagnosis not present

## 2013-08-03 DIAGNOSIS — M316 Other giant cell arteritis: Secondary | ICD-10-CM | POA: Diagnosis not present

## 2013-08-11 DIAGNOSIS — C44320 Squamous cell carcinoma of skin of unspecified parts of face: Secondary | ICD-10-CM | POA: Diagnosis not present

## 2013-08-11 DIAGNOSIS — L570 Actinic keratosis: Secondary | ICD-10-CM | POA: Diagnosis not present

## 2013-08-27 DIAGNOSIS — C44320 Squamous cell carcinoma of skin of unspecified parts of face: Secondary | ICD-10-CM | POA: Diagnosis not present

## 2013-08-27 DIAGNOSIS — H02059 Trichiasis without entropian unspecified eye, unspecified eyelid: Secondary | ICD-10-CM | POA: Diagnosis not present

## 2013-09-10 DIAGNOSIS — L570 Actinic keratosis: Secondary | ICD-10-CM | POA: Diagnosis not present

## 2013-09-11 DIAGNOSIS — J45901 Unspecified asthma with (acute) exacerbation: Secondary | ICD-10-CM | POA: Diagnosis not present

## 2013-10-06 ENCOUNTER — Other Ambulatory Visit: Payer: Self-pay | Admitting: *Deleted

## 2013-10-06 ENCOUNTER — Ambulatory Visit (INDEPENDENT_AMBULATORY_CARE_PROVIDER_SITE_OTHER): Payer: Medicare Other | Admitting: Cardiovascular Disease

## 2013-10-06 ENCOUNTER — Encounter: Payer: Self-pay | Admitting: Cardiovascular Disease

## 2013-10-06 VITALS — BP 148/64 | HR 63 | Ht 68.0 in | Wt 150.6 lb

## 2013-10-06 DIAGNOSIS — D689 Coagulation defect, unspecified: Secondary | ICD-10-CM

## 2013-10-06 DIAGNOSIS — I739 Peripheral vascular disease, unspecified: Secondary | ICD-10-CM

## 2013-10-06 DIAGNOSIS — E785 Hyperlipidemia, unspecified: Secondary | ICD-10-CM

## 2013-10-06 DIAGNOSIS — Z79899 Other long term (current) drug therapy: Secondary | ICD-10-CM

## 2013-10-06 DIAGNOSIS — R5383 Other fatigue: Secondary | ICD-10-CM

## 2013-10-06 DIAGNOSIS — Z01818 Encounter for other preprocedural examination: Secondary | ICD-10-CM

## 2013-10-06 DIAGNOSIS — I10 Essential (primary) hypertension: Secondary | ICD-10-CM

## 2013-10-06 DIAGNOSIS — R5381 Other malaise: Secondary | ICD-10-CM | POA: Diagnosis not present

## 2013-10-06 DIAGNOSIS — I2510 Atherosclerotic heart disease of native coronary artery without angina pectoris: Secondary | ICD-10-CM | POA: Diagnosis not present

## 2013-10-06 MED ORDER — AMLODIPINE BESYLATE 2.5 MG PO TABS: 2.5000 mg | ORAL_TABLET | Freq: Every day | ORAL | Status: DC

## 2013-10-06 NOTE — Patient Instructions (Signed)
Your physician has requested that you have a cardiac catheterization (radial). Cardiac catheterization is used to diagnose and/or treat various heart conditions. Doctors may recommend this procedure for a number of different reasons. The most common reason is to evaluate chest pain. Chest pain can be a symptom of coronary artery disease (CAD), and cardiac catheterization can show whether plaque is narrowing or blocking your heart's arteries. This procedure is also used to evaluate the valves, as well as measure the blood flow and oxygen levels in different parts of your heart. For further information please visit [email or web address].   Following your catheterization, you will not be allowed to drive for 3 days.  No lifting, pushing, or pulling greater that 10 pounds is allowed for 1 week.  When the procedure is scheduled, you will be given a date to have bloodwork and a chest xray done.    Dr Gwenlyn Found has ordered: Carotid Duplex- This test is an ultrasound of the carotid arteries in your neck. It looks at blood flow through these arteries that supply the brain with blood. Allow one hour for this exam. There are no restrictions or special instructions. lower extremity arterial doppler- During this test, ultrasound is used to evaluate arterial blood flow in the legs. Allow approximately one hour for this exam.   Start Amlodipine 2.5mg  one tablet daily

## 2013-10-06 NOTE — Assessment & Plan Note (Signed)
Well-controlled on current medications 

## 2013-10-06 NOTE — Assessment & Plan Note (Signed)
Patient with history of CAD status post stenting by Dr. Eustace Quail in 1999 and 2004. He had a negative Myoview stress test and 2-D echocardiogram late last year. He has recurrent exertional left upper extremity discomfort with dyspnea similar to his preintervention symptoms. I'm going to plan to perform cardiac catheterization on him via the right radial approach. We have discussed the risks and benefits. I'm also going to begin him on 2.5 mg of amlodipine by mouth as an antianginal. He is intolerant to nitrates because of headaches.

## 2013-10-06 NOTE — Progress Notes (Signed)
10/06/2013 Glenn Reilly   June 03, 1928  [id number]  Primary Physician Pcp Not In System Primary Cardiologist: Lorretta Harp MD Renae Gloss   HPI:  Glenn Reilly is an 80 year old thin appearing widowed Caucasian male father of one son who accompanies him today. He was referred by Dr. Vassie Loll Eyk who saw him today because of a recent episode of chest pain and dyspnea. His cardiac risk factor profile is remarkable for remote tobacco abuse, hyperlipidemia and hypertension. He has never had a heart attack but did have a stroke in 2011 at the time of left carotid endarterectomy. He said coronary artery disease with stenting in 1999 performed by Dr. Eustace Quail and again 2004. His left calf because k patent stent with moderate LAD and circumflex disease. He does have diastolic dysfunction. He has had lung cancer and right lung resection by Dr. Baldemar Friday in 2001. On Thursday night after mowing his lawn on a riding lower developed back pain rating to his chest associated with dyspnea on exertion. This resolved throughout the night has not recurred. He did have apparently inferior T-wave inversion on EKG today which was new.  Since I saw him in the office 2 weeks ago he's had no recurrent symptoms. A Myoview stress test was normal as was a 2-D echo echocardiogram. Lower extremity arterial Doppler studies showed ankle-brachial indices of greater than one bilaterally with a suggestion of mild to moderate iliac disease. Since I saw him in the office back in November, he has developed exertional left upper extremity discomfort with dyspnea over the last month which is fairly reproducible and similar to his prior symptoms before coronary intervention. Based on this, I'm going to proceed with cardiac catheterization via the right radial approach and will add low-dose amlodipine as an antianginal.    Current Outpatient Prescriptions  Medication Sig Dispense Refill  . ALBUTEROL SULFATE IN  Inhale 3 mg into the lungs as needed.      . furosemide (LASIX) 20 MG tablet Take 20 mg by mouth daily.      Marland Kitchen ipratropium (ATROVENT) 0.02 % nebulizer solution Take 500 mcg by nebulization daily.      Marland Kitchen levothyroxine (SYNTHROID, LEVOTHROID) 88 MCG tablet Take 88 mcg by mouth daily before breakfast.      . lisinopril (PRINIVIL,ZESTRIL) 40 MG tablet Take 40 mg by mouth daily.      Marland Kitchen omeprazole (PRILOSEC) 20 MG capsule Take 20 mg by mouth daily.      . predniSONE (DELTASONE) 10 MG tablet Take 10 mg by mouth daily.      Marland Kitchen amLODipine (NORVASC) 2.5 MG tablet Take 1 tablet (2.5 mg total) by mouth daily.  180 tablet  3   No current facility-administered medications for this visit.    Allergies  Allergen Reactions  . Demerol [Meperidine]   . Meperidine Hcl     History   Social History  . Marital Status: Widowed    Spouse Name: N/A    Number of Children: N/A  . Years of Education: N/A   Occupational History  . Not on file.   Social History Main Topics  . Smoking status: Former Smoker    Types: Cigarettes    Quit date: 03/02/1973  . Smokeless tobacco: Current User    Types: Chew  . Alcohol Use: Not on file  . Drug Use: Not on file  . Sexual Activity: Not on file   Other Topics Concern  . Not on file  Social History Narrative  . No narrative on file     Review of Systems: General: negative for chills, fever, night sweats or weight changes.  Cardiovascular: negative for chest pain, dyspnea on exertion, edema, orthopnea, palpitations, paroxysmal nocturnal dyspnea or shortness of breath Dermatological: negative for rash Respiratory: negative for cough or wheezing Urologic: negative for hematuria Abdominal: negative for nausea, vomiting, diarrhea, bright red blood per rectum, melena, or hematemesis Neurologic: negative for visual changes, syncope, or dizziness All other systems reviewed and are otherwise negative except as noted above.    Blood pressure 148/64, pulse 63,  height 5\' 8"  (1.727 m), weight 150 lb 9.6 oz (68.312 kg).  General appearance: alert and no distress Neck: no adenopathy, no JVD, supple, symmetrical, trachea midline, thyroid not enlarged, symmetric, no tenderness/mass/nodules and soft right carotid bruit Lungs: clear to auscultation bilaterally Heart: regular rate and rhythm, S1, S2 normal, no murmur, click, rub or gallop Extremities: extremities normal, atraumatic, no cyanosis or edema  EKG normal sinus rhythm at 63 with nonspecific ST and T wave changes  ASSESSMENT AND PLAN:   CAD, NATIVE VESSEL Patient with history of CAD status post stenting by Dr. Eustace Quail in 1999 and 2004. He had a negative Myoview stress test and 2-D echocardiogram late last year. He has recurrent exertional left upper extremity discomfort with dyspnea similar to his preintervention symptoms. I'm going to plan to perform cardiac catheterization on him via the right radial approach. We have discussed the risks and benefits. I'm also going to begin him on 2.5 mg of amlodipine by mouth as an antianginal. He is intolerant to nitrates because of headaches.  Carotid artery disease Status post remote left carotid endarterectomy. He has a soft right carotid bruit. He has not had a carotid Doppler study in the recent past. I am going to repeat this  PVD History of bilateral iliac stenting and an outside institution. He currently denies claudication. His most recent lower extremity arterial Doppler studies performed 03/24/13 revealed ABIs of greater than 1 bilaterally with mild to moderate disease within his iliac stents. These will continue to be followed on a semiannual basis.  HYPERTENSION, BENIGN Well-controlled on current medications  HYPERLIPIDEMIA-MIXED Not on statin therapy because of statin intolerance      Lorretta Harp MD [facility], [facility] 10/06/2013 8:12 AM

## 2013-10-06 NOTE — Assessment & Plan Note (Signed)
Not on statin therapy because of statin intolerance

## 2013-10-06 NOTE — Assessment & Plan Note (Signed)
Status post remote left carotid endarterectomy. He has a soft right carotid bruit. He has not had a carotid Doppler study in the recent past. I am going to repeat this

## 2013-10-06 NOTE — Assessment & Plan Note (Signed)
History of bilateral iliac stenting and an outside institution. He currently denies claudication. His most recent lower extremity arterial Doppler studies performed 03/24/13 revealed ABIs of greater than 1 bilaterally with mild to moderate disease within his iliac stents. These will continue to be followed on a semiannual basis.

## 2013-10-12 DIAGNOSIS — R911 Solitary pulmonary nodule: Secondary | ICD-10-CM | POA: Diagnosis not present

## 2013-10-12 DIAGNOSIS — Z85118 Personal history of other malignant neoplasm of bronchus and lung: Secondary | ICD-10-CM | POA: Diagnosis not present

## 2013-10-12 DIAGNOSIS — C3430 Malignant neoplasm of lower lobe, unspecified bronchus or lung: Secondary | ICD-10-CM | POA: Diagnosis not present

## 2013-10-12 DIAGNOSIS — Z9889 Other specified postprocedural states: Secondary | ICD-10-CM | POA: Diagnosis not present

## 2013-10-13 DIAGNOSIS — C3490 Malignant neoplasm of unspecified part of unspecified bronchus or lung: Secondary | ICD-10-CM | POA: Diagnosis not present

## 2013-10-13 DIAGNOSIS — C3430 Malignant neoplasm of lower lobe, unspecified bronchus or lung: Secondary | ICD-10-CM | POA: Diagnosis not present

## 2013-10-14 DIAGNOSIS — Z7901 Long term (current) use of anticoagulants: Secondary | ICD-10-CM | POA: Diagnosis not present

## 2013-10-14 DIAGNOSIS — R911 Solitary pulmonary nodule: Secondary | ICD-10-CM | POA: Diagnosis not present

## 2013-10-14 DIAGNOSIS — Z09 Encounter for follow-up examination after completed treatment for conditions other than malignant neoplasm: Secondary | ICD-10-CM | POA: Diagnosis not present

## 2013-10-14 DIAGNOSIS — Z85118 Personal history of other malignant neoplasm of bronchus and lung: Secondary | ICD-10-CM | POA: Diagnosis not present

## 2013-10-16 DIAGNOSIS — C3410 Malignant neoplasm of upper lobe, unspecified bronchus or lung: Secondary | ICD-10-CM | POA: Diagnosis not present

## 2013-10-16 DIAGNOSIS — C3490 Malignant neoplasm of unspecified part of unspecified bronchus or lung: Secondary | ICD-10-CM | POA: Diagnosis not present

## 2013-10-16 DIAGNOSIS — C3430 Malignant neoplasm of lower lobe, unspecified bronchus or lung: Secondary | ICD-10-CM | POA: Diagnosis not present

## 2013-10-19 ENCOUNTER — Encounter: Payer: Self-pay | Admitting: Critical Care Medicine

## 2013-10-19 ENCOUNTER — Ambulatory Visit (INDEPENDENT_AMBULATORY_CARE_PROVIDER_SITE_OTHER): Payer: Medicare Other | Admitting: Critical Care Medicine

## 2013-10-19 VITALS — BP 138/72 | HR 65 | Temp 97.2°F | Ht 68.0 in | Wt 150.6 lb

## 2013-10-19 DIAGNOSIS — C3490 Malignant neoplasm of unspecified part of unspecified bronchus or lung: Secondary | ICD-10-CM | POA: Insufficient documentation

## 2013-10-19 DIAGNOSIS — R0602 Shortness of breath: Secondary | ICD-10-CM

## 2013-10-19 DIAGNOSIS — R222 Localized swelling, mass and lump, trunk: Secondary | ICD-10-CM | POA: Diagnosis not present

## 2013-10-19 DIAGNOSIS — I2510 Atherosclerotic heart disease of native coronary artery without angina pectoris: Secondary | ICD-10-CM

## 2013-10-19 DIAGNOSIS — J438 Other emphysema: Secondary | ICD-10-CM | POA: Diagnosis not present

## 2013-10-19 DIAGNOSIS — Z85118 Personal history of other malignant neoplasm of bronchus and lung: Secondary | ICD-10-CM

## 2013-10-19 DIAGNOSIS — J439 Emphysema, unspecified: Secondary | ICD-10-CM

## 2013-10-19 DIAGNOSIS — R918 Other nonspecific abnormal finding of lung field: Secondary | ICD-10-CM

## 2013-10-19 HISTORY — DX: Other nonspecific abnormal finding of lung field: R91.8

## 2013-10-19 NOTE — Assessment & Plan Note (Signed)
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 This degree of lung disease will make it difficult and in fact impossible to pursue additional lung cancer resection Plan Maintain nebulized therapy as prescribed Radiation therapy focus to the left upper lobe lesion would be the best course of action as further surgical approach is contraindicated

## 2013-10-19 NOTE — Assessment & Plan Note (Signed)
Left upper lobe 1.4 cm lesion that is enlarging and has increased uptake on PET scan Malignancy until proven otherwise Significant emphysema surrounding the lesion making fine needle aspiration challenging and contraindicated Plan Cancel fine needle aspiration Pursue bronchoscopy with electronavigational approach Obtain old records from [place] [facility] Maintain inhaled medications as prescribed

## 2013-10-19 NOTE — Progress Notes (Signed)
Subjective:    [name], male    DOB: 03/23/1929, 80 y.o.   MRN: [id number]  HPI 80 y.o.M Referral from oncology for Lung mass.  This patient has previous right lower lobectomy in 2001 for carcinoma of the lung. Patient has been getting screening CT scans in the interim. There is a history of asbestos exposure on the job with calcified pleural plaques. The patient also has emphysema and is on nebulized therapy on schedule basis. Follow up scan between March of 2013 in June of 2013 is demonstrated an enlarging 1.4 cm irregular left upper lobe pulmonary nodule. PET scan has been performed and shows increased uptake in this area. The patient is referred for further evaluation. Note the patient artery has a needle biopsy scheduled however this nodule is quite central in location. There is surrounding emphysema in this lesion. This makes a fine needle aspiration extremely tricky and risky. Note the patient has rest angina and is scheduled for cardiac catheterization 11/02/2013.  The patient has dyspnea on exertion. There is no chest pain now. There is cough productive of thin mucus. There is no hemoptysis. There is no change in weight. Prior RLL lobectomy for CA of lung 2001 [place] No recent PFTs.     Pt denies any significant sore throat, nasal congestion or excess secretions, fever, chills, sweats, unintended weight loss, pleurtic or exertional chest pain, orthopnea PND, or leg swelling Pt denies any increase in rescue therapy over baseline, denies waking up needing it or having any early am or nocturnal exacerbations of coughing/wheezing/or dyspnea. Pt also denies any obvious fluctuation in symptoms with  weather or environmental change or other alleviating or aggravating factors   Past Medical History  Diagnosis Date  . CAD (coronary artery disease) 1999    pci  . PVD (peripheral vascular disease) 2011    carotid endarterectomy, pv stents  . Stroke 07/2009    left carotid  endarterectomy  . Lung cancer 2001    lower right lobe surgery to remove cancer, no chemo or radiation  . HTN (hypertension)   . Hyperlipemia   . Carotid artery disease   . Detached retina   . Cataract   . COPD (chronic obstructive pulmonary disease)   . Asbestosis      Family History  Problem Relation Age of Onset  . Heart attack Mother   . Heart attack Father   . Cancer Brother     esophagus and lung  . Cancer Brother     lung     History   Social History  . Marital Status: Widowed    Spouse Name: N/A    Number of Children: N/A  . Years of Education: N/A   Occupational History  . 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 Smoking status: Former Smoker -- 1.00 packs/day for 30 years    Types: Cigarettes    Quit date: 03/03/1979  . Smokeless tobacco: Current User    Types: Chew  . Alcohol Use: Not on file  . Drug Use: Not on file  . Sexual Activity: Not on file   Other Topics Concern  . Not on file   Social History Narrative  . No narrative on file     Allergies  Allergen Reactions  . Demerol [Meperidine]   . Meperidine Hcl      Outpatient Prescriptions Prior to Visit  Medication Sig Dispense Refill  . amLODipine (  NORVASC) 2.5 MG tablet Take 1 tablet (2.5 mg total) by mouth daily.  180 tablet  3  . furosemide (LASIX) 20 MG tablet Take 20 mg by mouth daily.      Marland Kitchen levothyroxine (SYNTHROID, LEVOTHROID) 88 MCG tablet Take 88 mcg by mouth daily before breakfast.      . lisinopril (PRINIVIL,ZESTRIL) 40 MG tablet Take 40 mg by mouth daily.      Marland Kitchen omeprazole (PRILOSEC) 20 MG capsule Take 20 mg by mouth daily as needed.       . predniSONE (DELTASONE) 10 MG tablet Take 10 mg by mouth daily.      . ALBUTEROL SULFATE IN Inhale 3 mg into the lungs as needed.      Marland Kitchen ipratropium (ATROVENT) 0.02 % nebulizer solution Take 500 mcg by nebulization daily.       No facility-administered medications prior to visit.       Review of Systems Constitutional:   No  weight loss, night sweats,  Fevers, chills, fatigue, lassitude. HEENT:   No headaches,  Difficulty swallowing,  Tooth/dental problems,  Sore throat,                No sneezing, itching, ear ache, nasal congestion, post nasal drip,   CV:  No chest pain,  Orthopnea, PND, swelling in lower extremities, anasarca, dizziness, palpitations  GI  No heartburn, indigestion, abdominal pain, nausea, vomiting, diarrhea, change in bowel habits, loss of appetite  Resp: Notes shortness of breath with exertion not at rest.  No excess mucus, no productive cough,  No non-productive cough,  No coughing up of blood.  No change in color of mucus.  No wheezing.  No chest wall deformity  Skin: no rash or lesions.  GU: no dysuria, change in color of urine, no urgency or frequency.  No flank pain.  MS:  No joint pain or swelling.  No decreased range of motion.  No back pain.  Psych:  No change in mood or affect. No depression or anxiety.  No memory loss.     Objective:   Physical Exam Filed Vitals:   10/19/13 0950  BP: 138/72  Pulse: 65  Temp: 97.2 F (36.2 C)  TempSrc: Oral  Height: 5\' 8"  (1.727 m)  Weight: 150 lb 9.6 oz (68.312 kg)  SpO2: 98%    Gen: Pleasant, well-nourished, in no distress,  normal affect  ENT: No lesions,  mouth clear,  oropharynx clear, no postnasal drip  Neck: No JVD, no TMG, no carotid bruits  Lungs: No use of accessory muscles, no dullness to percussion, distant bs  Cardiovascular: RRR, heart sounds normal, no murmur or gallops, no peripheral edema  Abdomen: soft and NT, no HSM,  BS normal  Musculoskeletal: No deformities, no cyanosis or clubbing  Neuro: alert, non focal  Skin: Warm, no lesions or rashes  Note with ambulation there is no significant desaturation No results found.  CT of the chest 10/12/2013 reveals an enlarging left upper lobe irregular nodule. Now 1.4 compared to 1.0 cm in diameter when compared  to March 2015 CT scan. There is calcified pleural plaques seen bilaterally. There is subpleural scarring seen as well. Previous right lower lobectomy has been demonstrated.  CT scan from June 2015 is reviewed this shows increased uptake in left upper lobe pulmonary nodule compatible with carcinoma of the lung. No other nodules or distant metastatic disease is seen  Spirometry 10/19/2013 is reviewed. 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 This degree of lung disease will make it difficult and in fact impossible to pursue additional lung cancer resection Plan Maintain nebulized therapy as prescribed Radiation therapy focus to the left upper lobe lesion would be the best course of action as further surgical approach is contraindicated   Updated Medication List Outpatient Encounter Prescriptions as of 10/19/2013  Medication Sig  . albuterol (PROVENTIL HFA;VENTOLIN HFA) 108 (90 BASE) MCG/ACT inhaler Inhale 2 puffs into the lungs every 6 (six) hours as needed for wheezing or shortness of breath.  Marland Kitchen amLODipine (NORVASC) 2.5 MG tablet Take 1 tablet (2.5 mg total) by mouth daily.  Marland Kitchen aspirin 81 MG tablet Take 81 mg by mouth daily.  . furosemide (LASIX) 20 MG tablet Take 20 mg by mouth daily.  . hydrALAZINE (APRESOLINE) 25 MG tablet Take 25 mg by mouth 3 (three) times daily.  Marland Kitchen ipratropium-albuterol (DUONEB) 0.5-2.5 (3) MG/3ML SOLN Take 3 mLs by nebulization 3  (three) times daily.  Marland Kitchen levothyroxine (SYNTHROID, LEVOTHROID) 88 MCG tablet Take 88 mcg by mouth daily before breakfast.  . lisinopril (PRINIVIL,ZESTRIL) 40 MG tablet Take 40 mg by mouth daily.  Marland Kitchen omeprazole (PRILOSEC) 20 MG capsule Take 20 mg by mouth daily as needed.   . predniSONE (DELTASONE) 10 MG tablet Take 10 mg by mouth daily.  . [DISCONTINUED] ALBUTEROL SULFATE IN Inhale 3 mg into the lungs as needed.  . [DISCONTINUED] ipratropium (ATROVENT) 0.02 % nebulizer solution Take 500 mcg by nebulization daily.

## 2013-10-19 NOTE — Patient Instructions (Signed)
We plan an electromagnetic navigational bronchoscopy per Dr Londell Moh We will obtain records from Dr Alcide Clever We will cancel the needle biopsy  We will call results. Need cath done PRIOR to bronchoscopy Treatment will be focused Radiation to the lung. No surgery planned Return [place] 1 month

## 2013-10-20 ENCOUNTER — Telehealth: Payer: Self-pay | Admitting: *Deleted

## 2013-10-20 NOTE — Telephone Encounter (Signed)
Message copied by Chauncy Lean on Tue Oct 20, 2013  8:51 AM ------      Message from: Lorretta Harp      Created: Mon Oct 19, 2013 10:33 PM      Regarding: Cath       Apparently pt is scheduled for cath 6/22 and Fraser Din Write thinks he needs to be cathed sooner because he's having rest angina. What can we do with my schedule to make this happen?            JJB ------

## 2013-10-20 NOTE — Telephone Encounter (Signed)
Dr Gwenlyn Found reviewed the cath schedule- the cath cannot be done by him before 6/22, but patient can have it done by another MD  I contacted patient.  He wants to wait until 6/22, but he will contact me if he changes his mind or if symptoms progress

## 2013-10-20 NOTE — Telephone Encounter (Signed)
Returning your call from yesterday. °

## 2013-10-26 DIAGNOSIS — R5383 Other fatigue: Secondary | ICD-10-CM | POA: Diagnosis not present

## 2013-10-26 DIAGNOSIS — J984 Other disorders of lung: Secondary | ICD-10-CM | POA: Diagnosis not present

## 2013-10-26 DIAGNOSIS — R5381 Other malaise: Secondary | ICD-10-CM | POA: Diagnosis not present

## 2013-10-26 DIAGNOSIS — Z79899 Other long term (current) drug therapy: Secondary | ICD-10-CM | POA: Diagnosis not present

## 2013-10-26 DIAGNOSIS — R0602 Shortness of breath: Secondary | ICD-10-CM | POA: Diagnosis not present

## 2013-10-26 DIAGNOSIS — D689 Coagulation defect, unspecified: Secondary | ICD-10-CM | POA: Diagnosis not present

## 2013-10-26 LAB — BASIC METABOLIC PANEL
BUN: 29 mg/dL — AB (ref 6–23)
CHLORIDE: 105 meq/L (ref 96–112)
CO2: 28 meq/L (ref 19–32)
Calcium: 10.1 mg/dL (ref 8.4–10.5)
Creat: 1.4 mg/dL — ABNORMAL HIGH (ref 0.50–1.35)
Glucose, Bld: 92 mg/dL (ref 70–99)
POTASSIUM: 4.1 meq/L (ref 3.5–5.3)
Sodium: 143 mEq/L (ref 135–145)

## 2013-10-26 LAB — CBC
HCT: 41.2 % (ref 39.0–52.0)
Hemoglobin: 13.8 g/dL (ref 13.0–17.0)
MCH: 29.9 pg (ref 26.0–34.0)
MCHC: 33.5 g/dL (ref 30.0–36.0)
MCV: 89.4 fL (ref 78.0–100.0)
PLATELETS: 192 10*3/uL (ref 150–400)
RBC: 4.61 MIL/uL (ref 4.22–5.81)
RDW: 14.4 % (ref 11.5–15.5)
WBC: 7.9 10*3/uL (ref 4.0–10.5)

## 2013-10-27 LAB — TSH: TSH: 0.854 u[IU]/mL (ref 0.350–4.500)

## 2013-10-27 LAB — PROTIME-INR
INR: 1.17 (ref <1.50)
Prothrombin Time: 14.8 seconds (ref 11.6–15.2)

## 2013-10-27 LAB — APTT: aPTT: 31 seconds (ref 24–37)

## 2013-10-28 ENCOUNTER — Other Ambulatory Visit: Payer: Self-pay | Admitting: *Deleted

## 2013-10-28 ENCOUNTER — Telehealth: Payer: Self-pay | Admitting: *Deleted

## 2013-10-28 NOTE — Telephone Encounter (Signed)
Message copied by Chauncy Lean on Wed Oct 28, 2013  4:08 PM ------      Message from: Lorretta Harp      Created: Wed Oct 28, 2013  3:53 PM       SCr increased compared to prior values. Hold the lisinopril and lasix for several days prior to cath and re check stat AM of procedure ------

## 2013-10-29 ENCOUNTER — Encounter (HOSPITAL_COMMUNITY): Payer: Self-pay | Admitting: Pharmacy Technician

## 2013-11-02 ENCOUNTER — Telehealth: Payer: Self-pay | Admitting: Critical Care Medicine

## 2013-11-02 DIAGNOSIS — R911 Solitary pulmonary nodule: Secondary | ICD-10-CM

## 2013-11-02 NOTE — Telephone Encounter (Signed)
Crystal do you know when this pt is scheduled for his bronch?  Please advise. thanks

## 2013-11-02 NOTE — Telephone Encounter (Signed)
Called, spoke with Angela Nevin with [facility]. Per Hinton Rao is wanting to know the status of the bronch so they can schedule pt's appt with Dr. Hinton Rao accordingly.   Dr. Lamonte Sakai, pls advise if this has been scheduled yet or if we can proceed with scheduling.   Thank you.

## 2013-11-02 NOTE — Telephone Encounter (Signed)
It looks like the L heart cath is scheduled for 11/05/13. I will work on scheduling for soon after that.

## 2013-11-04 DIAGNOSIS — C3410 Malignant neoplasm of upper lobe, unspecified bronchus or lung: Secondary | ICD-10-CM | POA: Diagnosis not present

## 2013-11-04 DIAGNOSIS — C3430 Malignant neoplasm of lower lobe, unspecified bronchus or lung: Secondary | ICD-10-CM | POA: Diagnosis not present

## 2013-11-04 DIAGNOSIS — D649 Anemia, unspecified: Secondary | ICD-10-CM | POA: Diagnosis not present

## 2013-11-04 DIAGNOSIS — Z09 Encounter for follow-up examination after completed treatment for conditions other than malignant neoplasm: Secondary | ICD-10-CM | POA: Diagnosis not present

## 2013-11-04 NOTE — Telephone Encounter (Addendum)
Per Glenn Reilly has been scheduled for Friday, June 26 and pt is aware. I spoke with Dr. Hinton Rao and informed her of above.

## 2013-11-04 NOTE — Telephone Encounter (Signed)
Spoke with Glenn Reilly  She is asking about when Charleen Kirks is supposed to be set up  Pt has ov with Dr Hinton Rao this am and she wants to know  I advised per RB, he will work on setting this up after the cath that is to be done on 11/05/13 She verbalized understanding and nothing further needed

## 2013-11-04 NOTE — Telephone Encounter (Signed)
[facility] calling again a/b the bronch that this pt is having today, needing a call bk asap Angela Nevin cn be reached @ [phone] ext [phone]

## 2013-11-05 ENCOUNTER — Ambulatory Visit (HOSPITAL_COMMUNITY)
Admission: RE | Admit: 2013-11-05 | Discharge: 2013-11-05 | Disposition: A | Discharge status: Home / Self Care | Payer: Medicare Other | Source: Ambulatory Visit | Attending: Cardiovascular Disease | Admitting: Cardiovascular Disease

## 2013-11-05 ENCOUNTER — Encounter (HOSPITAL_COMMUNITY)
Admission: RE | Disposition: A | Discharge status: Home / Self Care | Payer: Self-pay | Source: Ambulatory Visit | Attending: Cardiovascular Disease

## 2013-11-05 DIAGNOSIS — Z85118 Personal history of other malignant neoplasm of bronchus and lung: Secondary | ICD-10-CM | POA: Diagnosis not present

## 2013-11-05 DIAGNOSIS — I10 Essential (primary) hypertension: Secondary | ICD-10-CM | POA: Insufficient documentation

## 2013-11-05 DIAGNOSIS — I25119 Atherosclerotic heart disease of native coronary artery with unspecified angina pectoris: Secondary | ICD-10-CM

## 2013-11-05 DIAGNOSIS — R0989 Other specified symptoms and signs involving the circulatory and respiratory systems: Secondary | ICD-10-CM | POA: Diagnosis not present

## 2013-11-05 DIAGNOSIS — R0609 Other forms of dyspnea: Secondary | ICD-10-CM | POA: Insufficient documentation

## 2013-11-05 DIAGNOSIS — R918 Other nonspecific abnormal finding of lung field: Secondary | ICD-10-CM

## 2013-11-05 DIAGNOSIS — I739 Peripheral vascular disease, unspecified: Secondary | ICD-10-CM

## 2013-11-05 DIAGNOSIS — Z902 Acquired absence of lung [part of]: Secondary | ICD-10-CM | POA: Insufficient documentation

## 2013-11-05 DIAGNOSIS — Z01818 Encounter for other preprocedural examination: Secondary | ICD-10-CM

## 2013-11-05 DIAGNOSIS — Z87891 Personal history of nicotine dependence: Secondary | ICD-10-CM | POA: Diagnosis not present

## 2013-11-05 DIAGNOSIS — I2510 Atherosclerotic heart disease of native coronary artery without angina pectoris: Secondary | ICD-10-CM | POA: Diagnosis not present

## 2013-11-05 DIAGNOSIS — E782 Mixed hyperlipidemia: Secondary | ICD-10-CM | POA: Insufficient documentation

## 2013-11-05 HISTORY — PX: LEFT HEART CATHETERIZATION WITH CORONARY ANGIOGRAM: SHX5451

## 2013-11-05 SURGERY — LEFT HEART CATHETERIZATION WITH CORONARY ANGIOGRAM
Anesthesia: LOCAL

## 2013-11-05 MED ORDER — MORPHINE SULFATE 2 MG/ML IJ SOLN: 1.0000 mg | INTRAMUSCULAR | Status: DC | PRN

## 2013-11-05 MED ORDER — HYDRALAZINE HCL 20 MG/ML IJ SOLN: 10.0000 mg | INTRAMUSCULAR | Status: DC | PRN

## 2013-11-05 MED ORDER — HEPARIN SODIUM (PORCINE) 1000 UNIT/ML IJ SOLN
INTRAMUSCULAR | Status: AC
  Filled 2013-11-05: qty 1

## 2013-11-05 MED ORDER — FENTANYL CITRATE 0.05 MG/ML IJ SOLN
INTRAMUSCULAR | Status: AC
  Filled 2013-11-05: qty 2

## 2013-11-05 MED ORDER — SODIUM CHLORIDE 0.9 % IV SOLN: INTRAVENOUS | Status: DC

## 2013-11-05 MED ORDER — ASPIRIN 81 MG PO CHEW: 81.0000 mg | CHEWABLE_TABLET | Freq: Every day | ORAL | Status: DC

## 2013-11-05 MED ORDER — HEPARIN (PORCINE) IN NACL 2-0.9 UNIT/ML-% IJ SOLN
INTRAMUSCULAR | Status: AC
  Filled 2013-11-05: qty 1500

## 2013-11-05 MED ORDER — SODIUM CHLORIDE 0.9 % IJ SOLN: 3.0000 mL | INTRAMUSCULAR | Status: DC | PRN

## 2013-11-05 MED ORDER — NITROGLYCERIN 0.2 MG/ML ON CALL CATH LAB
INTRAVENOUS | Status: AC
  Filled 2013-11-05: qty 1

## 2013-11-05 MED ORDER — ACETAMINOPHEN 325 MG PO TABS: 650.0000 mg | ORAL_TABLET | ORAL | Status: DC | PRN

## 2013-11-05 MED ORDER — LIDOCAINE HCL (PF) 1 % IJ SOLN
INTRAMUSCULAR | Status: AC
  Filled 2013-11-05: qty 30

## 2013-11-05 MED ORDER — VERAPAMIL HCL 2.5 MG/ML IV SOLN
INTRAVENOUS | Status: AC
  Filled 2013-11-05: qty 2

## 2013-11-05 MED ORDER — ONDANSETRON HCL 4 MG/2ML IJ SOLN
4.0000 mg | Freq: Four times a day (QID) | INTRAMUSCULAR | Status: DC | PRN
Start: 2013-11-05 — End: 2013-11-05

## 2013-11-05 MED ORDER — SODIUM CHLORIDE 0.9 % IV SOLN
INTRAVENOUS | Status: DC
  Administered 2013-11-05: 12:00:00 via INTRAVENOUS

## 2013-11-05 MED ORDER — ASPIRIN 81 MG PO CHEW
81.0000 mg | CHEWABLE_TABLET | ORAL | Status: AC
  Administered 2013-11-05: 81 mg via ORAL
  Filled 2013-11-05: qty 1

## 2013-11-05 MED ORDER — MIDAZOLAM HCL 2 MG/2ML IJ SOLN
INTRAMUSCULAR | Status: AC
  Filled 2013-11-05: qty 2

## 2013-11-05 NOTE — Interval H&P Note (Signed)
Cath Lab Visit (complete for each Cath Lab visit)  Clinical Evaluation Leading to the Procedure:   ACS: No.  Non-ACS:    Anginal Classification: CCS III  Anti-ischemic medical therapy: Minimal Therapy (1 class of medications)  Non-Invasive Test Results: No non-invasive testing performed  Prior CABG: No previous CABG      History and Physical Interval Note:  11/05/2013 4:10 PM  Alyson Locket Kempa  has presented today for surgery, with the diagnosis of cad  The various methods of treatment have been discussed with the patient and family. After consideration of risks, benefits and other options for treatment, the patient has consented to  Procedure(s): LEFT HEART CATHETERIZATION WITH CORONARY ANGIOGRAM (N/A) as a surgical intervention .  The patient's history has been reviewed, patient examined, no change in status, stable for surgery.  I have reviewed the patient's chart and labs.  Questions were answered to the patient's satisfaction.     Lorretta Harp

## 2013-11-05 NOTE — H&P (View-Only) (Signed)
10/06/2013 [place]   05-Nov-1928  [id number]  Primary Physician Pcp Not In System Primary Cardiologist: Lorretta Harp MD Renae Gloss   HPI:  Mr. Brightbill is an 80 year old thin appearing widowed Caucasian male father of one son who accompanies him today. He was referred by Dr. Vassie Loll Eyk who saw him today because of a recent episode of chest pain and dyspnea. His cardiac risk factor profile is remarkable for remote tobacco abuse, hyperlipidemia and hypertension. He has never had a heart attack but did have a stroke in 2011 at the time of left carotid endarterectomy. He said coronary artery disease with stenting in 1999 performed by Dr. Eustace Quail and again 2004. His left calf because k patent stent with moderate LAD and circumflex disease. He does have diastolic dysfunction. He has had lung cancer and right lung resection by Dr. Baldemar Friday in 2001. On Thursday night after mowing his lawn on a riding lower developed back pain rating to his chest associated with dyspnea on exertion. This resolved throughout the night has not recurred. He did have apparently inferior T-wave inversion on EKG today which was new.  Since I saw him in the office 2 weeks ago he's had no recurrent symptoms. A Myoview stress test was normal as was a 2-D echo echocardiogram. Lower extremity arterial Doppler studies showed ankle-brachial indices of greater than one bilaterally with a suggestion of mild to moderate iliac disease. Since I saw him in the office back in November, he has developed exertional left upper extremity discomfort with dyspnea over the last month which is fairly reproducible and similar to his prior symptoms before coronary intervention. Based on this, I'm going to proceed with cardiac catheterization via the right radial approach and will add low-dose amlodipine as an antianginal.    Current Outpatient Prescriptions  Medication Sig Dispense Refill  . ALBUTEROL SULFATE IN  Inhale 3 mg into the lungs as needed.      . furosemide (LASIX) 20 MG tablet Take 20 mg by mouth daily.      Marland Kitchen ipratropium (ATROVENT) 0.02 % nebulizer solution Take 500 mcg by nebulization daily.      Marland Kitchen levothyroxine (SYNTHROID, LEVOTHROID) 88 MCG tablet Take 88 mcg by mouth daily before breakfast.      . lisinopril (PRINIVIL,ZESTRIL) 40 MG tablet Take 40 mg by mouth daily.      Marland Kitchen omeprazole (PRILOSEC) 20 MG capsule Take 20 mg by mouth daily.      . predniSONE (DELTASONE) 10 MG tablet Take 10 mg by mouth daily.      Marland Kitchen amLODipine (NORVASC) 2.5 MG tablet Take 1 tablet (2.5 mg total) by mouth daily.  180 tablet  3   No current facility-administered medications for this visit.    Allergies  Allergen Reactions  . Demerol [Meperidine]   . Meperidine Hcl     History   Social History  . Marital Status: Widowed    Spouse Name: N/A    Number of Children: N/A  . Years of Education: N/A   Occupational History  . Not on file.   Social History Main Topics  . Smoking status: Former Smoker    Types: Cigarettes    Quit date: 03/02/1973  . Smokeless tobacco: Current User    Types: Chew  . Alcohol Use: Not on file  . Drug Use: Not on file  . Sexual Activity: Not on file   Other Topics Concern  . Not on file  Social History Narrative  . No narrative on file     Review of Systems: General: negative for chills, fever, night sweats or weight changes.  Cardiovascular: negative for chest pain, dyspnea on exertion, edema, orthopnea, palpitations, paroxysmal nocturnal dyspnea or shortness of breath Dermatological: negative for rash Respiratory: negative for cough or wheezing Urologic: negative for hematuria Abdominal: negative for nausea, vomiting, diarrhea, bright red blood per rectum, melena, or hematemesis Neurologic: negative for visual changes, syncope, or dizziness All other systems reviewed and are otherwise negative except as noted above.    Blood pressure 148/64, pulse 63,  height 5\' 8"  (1.727 m), weight 150 lb 9.6 oz (68.312 kg).  General appearance: alert and no distress Neck: no adenopathy, no JVD, supple, symmetrical, trachea midline, thyroid not enlarged, symmetric, no tenderness/mass/nodules and soft right carotid bruit Lungs: clear to auscultation bilaterally Heart: regular rate and rhythm, S1, S2 normal, no murmur, click, rub or gallop Extremities: extremities normal, atraumatic, no cyanosis or edema  EKG normal sinus rhythm at 63 with nonspecific ST and T wave changes  ASSESSMENT AND PLAN:   CAD, NATIVE VESSEL Patient with history of CAD status post stenting by Dr. Eustace Quail in 1999 and 2004. He had a negative Myoview stress test and 2-D echocardiogram late last year. He has recurrent exertional left upper extremity discomfort with dyspnea similar to his preintervention symptoms. I'm going to plan to perform cardiac catheterization on him via the right radial approach. We have discussed the risks and benefits. I'm also going to begin him on 2.5 mg of amlodipine by mouth as an antianginal. He is intolerant to nitrates because of headaches.  Carotid artery disease Status post remote left carotid endarterectomy. He has a soft right carotid bruit. He has not had a carotid Doppler study in the recent past. I am going to repeat this  PVD History of bilateral iliac stenting and an outside institution. He currently denies claudication. His most recent lower extremity arterial Doppler studies performed 03/24/13 revealed ABIs of greater than 1 bilaterally with mild to moderate disease within his iliac stents. These will continue to be followed on a semiannual basis.  HYPERTENSION, BENIGN Well-controlled on current medications  HYPERLIPIDEMIA-MIXED Not on statin therapy because of statin intolerance      Lorretta Harp MD [facility], [facility] 10/06/2013 8:12 AM

## 2013-11-05 NOTE — Progress Notes (Signed)
Pt is here at the hospital and just had a cardiac cath. I was going to talk with his wife to go over pre-op instructions for bronchoscopy that is scheduled for tomorrow. Pt's son and daughter-in-law are also here and they are saying that the Bronchoscopy was to be cancelled by pt's PCP Dr. Hinton Rao. That the pt had refused to have the procedure done. I've called Dr. Agustina Caroli office and requested that the on call physician call me

## 2013-11-05 NOTE — CV Procedure (Signed)
Glenn Reilly is a 80 y.o. male    [id number] LOCATION:  FACILITY: [facility]  PHYSICIAN: Quay Burow, M.D. 04-10-1929   DATE OF PROCEDURE:  11/05/2013  DATE OF DISCHARGE:     CARDIAC CATHETERIZATION     History obtained from chart review.Glenn Reilly is an 80 year old thin appearing widowed Caucasian male father of one son who accompanies him today. He was referred by Dr. Vassie Loll Eyk who saw him today because of a recent episode of chest pain and dyspnea. His cardiac risk factor profile is remarkable for remote tobacco abuse, hyperlipidemia and hypertension. He has never had a heart attack but did have a stroke in 2011 at the time of left carotid endarterectomy. He said coronary artery disease with stenting in 1999 performed by Dr. Eustace Quail and again 2004. His left calf because k patent stent with moderate LAD and circumflex disease. He does have diastolic dysfunction. He has had lung cancer and right lung resection by Dr. Baldemar Friday in 2001. On Thursday night after mowing his lawn on a riding lower developed back pain rating to his chest associated with dyspnea on exertion. This resolved throughout the night has not recurred. He did have apparently inferior T-wave inversion on EKG today which was new.  Since I saw him in the office 2 weeks ago he's had no recurrent symptoms. A Myoview stress test was normal as was a 2-D echo echocardiogram. Lower extremity arterial Doppler studies showed ankle-brachial indices of greater than one bilaterally with a suggestion of mild to moderate iliac disease. Since I saw him in the office back in November, he has developed exertional left upper extremity discomfort with dyspnea over the last month which is fairly reproducible and similar to his prior symptoms before coronary intervention. Based on this, I'm going to proceed with cardiac catheterization via the right radial approach and will add low-dose amlodipine as an antianginal.    PROCEDURE  DESCRIPTION:   The patient was brought to the second floor  Cardiac cath lab in the postabsorptive state. He was premedicated with Valium 5 mg by mouth, IV Versed and fentanyl. His right wristwas prepped and shaved in usual sterile fashion.Xylocaine 1% was used for local anesthesia. A 5 French sheath was inserted into the right radial artery using standard Seldinger technique. The patient received  3500 units  of heparin  intravenously.  Using a 5 [place] TIG catheter and ultimately a 5 [place] FL3 guide catheter and pigtail catheter selective coronary angiography and left ventriculography were performed. Omnipaque dye was usedfor the entirety of the case.retrograde aortic, left ventricular and pullback pressures were recorded.    HEMODYNAMICS:    AO SYSTOLIC/AO DIASTOLIC: 371/69   LV SYSTOLIC/LV DIASTOLIC: 678/93  ANGIOGRAPHIC RESULTS:   1. Left main; 50% diffuse damping using a 5 [place] catheter  2. LAD; 75% focal proximal fluoroscopic calcification 3. Left circumflex; 75-80% with ostial fluoroscopic calcification.  4. Right coronary artery; dominant with a long 50% segmental mid stenosis 5. Left ventriculography; RAO left ventriculogram was performed using  25 mL of Visipaque dye at 12 mL/second. The overall LVEF estimated  70 % Without wall motion abnormalities  IMPRESSION:Glenn Reilly has 3 vessel disease with moderate RCA disease, moderate left main, and significant proximal LAD and circumflex disease normal LV function. He does have progressive symptoms. In going to review his intravenous bicarbonate prior to making a recommendation that I am leaning towards coronary bypass grafting. The radial sheath was removed and a TR Band  was  placed on the right wrist to achieve patent hemostasis. The patient left the Cath Lab in stable condition. He will be hydrated for 2 hours and discharged home. I will see him back in the office in one to 2 weeks for followup  Lorretta Harp. MD,  [facility] 11/05/2013 5:22 PM

## 2013-11-05 NOTE — Discharge Instructions (Signed)
Radial Site Care °Refer to this sheet in the next few weeks. These instructions provide you with information on caring for yourself after your procedure. Your caregiver may also give you more specific instructions. Your treatment has been planned according to current medical practices, but problems sometimes occur. Call your caregiver if you have any problems or questions after your procedure. °HOME CARE INSTRUCTIONS °· You may shower the day after the procedure. Remove the bandage (dressing) and gently wash the site with plain soap and water. Gently pat the site dry. °· Do not apply powder or lotion to the site. °· Do not submerge the affected site in water for 3 to 5 days. °· Inspect the site at least twice daily. °· Do not flex or bend the affected arm for 24 hours. °· No lifting over 5 pounds (2.3 kg) for 5 days after your procedure. °· Do not drive home if you are discharged the same day of the procedure. Have someone else drive you. °· You may drive 24 hours after the procedure unless otherwise instructed by your caregiver. °· Do not operate machinery or power tools for 24 hours. °· A responsible adult should be with you for the first 24 hours after you arrive home. °What to expect: °· Any bruising will usually fade within 1 to 2 weeks. °· Blood that collects in the tissue (hematoma) may be painful to the touch. It should usually decrease in size and tenderness within 1 to 2 weeks. °SEEK IMMEDIATE MEDICAL CARE IF: °· You have unusual pain at the radial site. °· You have redness, warmth, swelling, or pain at the radial site. °· You have drainage (other than a small amount of blood on the dressing). °· You have chills. °· You have a fever or persistent symptoms for more than 72 hours. °· You have a fever and your symptoms suddenly get worse. °· Your arm becomes pale, cool, tingly, or numb. °· You have heavy bleeding from the site. Hold pressure on the site. °Document Released: 06/02/2010 Document Revised:  07/23/2011 Document Reviewed: 06/02/2010 °ExitCare® Patient Information ©2015 [employer]. This information is not intended to replace advice given to you by your health care provider. Make sure you discuss any questions you have with your health care provider. ° °

## 2013-11-06 ENCOUNTER — Ambulatory Visit (HOSPITAL_COMMUNITY): Admission: RE | Admit: 2013-11-06 | Payer: Medicare Other | Source: Ambulatory Visit | Admitting: Emergency Medicine

## 2013-11-06 ENCOUNTER — Telehealth: Payer: Self-pay | Admitting: Emergency Medicine

## 2013-11-06 ENCOUNTER — Encounter (HOSPITAL_COMMUNITY): Admission: RE | Payer: Self-pay | Source: Ambulatory Visit

## 2013-11-06 SURGERY — VIDEO BRONCHOSCOPY WITH ENDOBRONCHIAL NAVIGATION
Anesthesia: General

## 2013-11-06 NOTE — Telephone Encounter (Signed)
Spoke with the patient, Dr Gwenlyn Found and also reviewed case with Dr Cyndia Bent. General anesthesia carries significant risk due to his CAD. I would not take him to OR today. I believe he needs to see TCTS, at least discuss the pro's / con's of a CABG and possible wedge of his LUL nodule (he may not be a good candidate for this procedure either due to his COPD). If he needs a biopsy before any surgery, needle biopsy may be possible although he is high risk for PTX. He is planning to see Dr Gwenlyn Found next week and I anticipate that he will also meet with TCTS soon after. I will update Dr Joya Gaskins, follow along to help facilitate planning

## 2013-11-06 NOTE — Progress Notes (Signed)
On call physician did not return call last pm. I called Dr. Agustina Caroli office just now and spoke with [place]. I gave her the information that the pt's family had told me that the bronchoscopy had been cancelled. She states she will page Dr. Lamonte Sakai and give him the information.

## 2013-11-08 NOTE — Telephone Encounter (Signed)
Thanks.  I had gotten a call last week from [place] [facility] who indicated the pt was going to hold off on any work up

## 2013-11-09 ENCOUNTER — Encounter: Payer: Self-pay | Admitting: Cardiovascular Disease

## 2013-11-09 ENCOUNTER — Ambulatory Visit (INDEPENDENT_AMBULATORY_CARE_PROVIDER_SITE_OTHER): Payer: Medicare Other | Admitting: Cardiovascular Disease

## 2013-11-09 VITALS — BP 122/76 | HR 65 | Ht 68.0 in | Wt 147.1 lb

## 2013-11-09 DIAGNOSIS — I10 Essential (primary) hypertension: Secondary | ICD-10-CM

## 2013-11-09 DIAGNOSIS — I2510 Atherosclerotic heart disease of native coronary artery without angina pectoris: Secondary | ICD-10-CM | POA: Diagnosis not present

## 2013-11-09 DIAGNOSIS — I209 Angina pectoris, unspecified: Secondary | ICD-10-CM

## 2013-11-09 DIAGNOSIS — I25119 Atherosclerotic heart disease of native coronary artery with unspecified angina pectoris: Secondary | ICD-10-CM

## 2013-11-09 NOTE — Patient Instructions (Signed)
Your physician wants you to follow-up in: 4-6 weeks with Dr Gwenlyn Found. You will receive a reminder letter in the mail two months in advance. If you don't receive a letter, please call our office to schedule the follow-up appointment.  Dr Gwenlyn Found has referred you to the cardiovascular surgeons.

## 2013-11-09 NOTE — Progress Notes (Signed)
11/09/2013 Glenn Reilly   May 16, 1928  [id number]  Primary Physician Glenn Roger, MD Primary Cardiologist: Glenn Harp MD Glenn Reilly   HPI:  [name] is an 80 year old thin appearing widowed Caucasian male father of one son who accompanies him today. He was referred by Dr. Vassie Loll Reilly who saw him today because of a recent episode of chest pain and dyspnea. His cardiac risk factor profile is remarkable for remote tobacco abuse, hyperlipidemia and hypertension. He has never had a heart attack but did have a stroke in 2011 at the time of left carotid endarterectomy. He said coronary artery disease with stenting in 1999 performed by Dr. Eustace Reilly and again 2004. His left calf because k patent stent with moderate LAD and circumflex disease. He does have diastolic dysfunction. He has had lung cancer and right lung resection by Dr. Baldemar Reilly in 2001. On Thursday night after mowing his lawn on a riding lower developed back pain rating to his chest associated with dyspnea on exertion. This resolved throughout the night has not recurred. He did have apparently inferior T-wave inversion on EKG today which was new.  Since I saw him in the office 2 weeks ago he's had no recurrent symptoms. A Myoview stress test was normal as was a 2-D echo echocardiogram. Lower extremity arterial Doppler studies showed ankle-brachial indices of greater than one bilaterally with a suggestion of mild to moderate iliac disease. Since I saw him in the office back in November, he has developed exertional left upper extremity discomfort with dyspnea over the last month which is fairly reproducible and similar to his prior symptoms before coronary intervention. Based on this, I performed outpatient cardiac catheterization on 11/05/13 in the right radial approach. I demonstrated 50% left main, high-grade proximal LAD, ostial circumflex and moderate mid RCA disease with normal LV function. Based on this, I  suggested coronary artery bypass grafting is the best revascularization strategy especially in light of his pulmonary nodule which could potentially be biopsied at the time of surgery.   Current Outpatient Prescriptions  Medication Sig Dispense Refill  . albuterol (PROVENTIL HFA;VENTOLIN HFA) 108 (90 BASE) MCG/ACT inhaler Inhale 2 puffs into the lungs every 6 (six) hours as needed for wheezing or shortness of breath.      Marland Kitchen amLODipine (NORVASC) 2.5 MG tablet Take 1 tablet (2.5 mg total) by mouth daily.  180 tablet  3  . aspirin 81 MG tablet Take 81 mg by mouth daily.      . furosemide (LASIX) 20 MG tablet Take 20 mg by mouth daily.      . hydrALAZINE (APRESOLINE) 25 MG tablet Take 25 mg by mouth 3 (three) times daily.      Marland Kitchen ipratropium-albuterol (DUONEB) 0.5-2.5 (3) MG/3ML SOLN Take 3 mLs by nebulization 3 (three) times daily.      Marland Kitchen levothyroxine (SYNTHROID, LEVOTHROID) 88 MCG tablet Take 88 mcg by mouth daily before breakfast.      . lisinopril (PRINIVIL,ZESTRIL) 40 MG tablet Take 40 mg by mouth daily.      Marland Kitchen OVER THE COUNTER MEDICATION Take 1 tablet by mouth daily. Mega Red Omega 3 capsule      . predniSONE (DELTASONE) 10 MG tablet Take 10 mg by mouth daily.       No current facility-administered medications for this visit.    Allergies  Allergen Reactions  . Demerol [Meperidine] Other (See Comments)    shock    History   Social  History  . Marital Status: Widowed    Spouse Name: N/A    Number of Children: N/A  . Years of Education: N/A   Occupational History  . 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 Smoking status: Former Smoker -- 1.00 packs/day for 30 years    Types: Cigarettes    Quit date: 03/03/1979  . Smokeless tobacco: Current User    Types: Chew  . Alcohol Use: Not on file  . Drug Use: Not on file  . Sexual Activity: Not on file   Other Topics Concern  . Not on file   Social History Narrative  . No  narrative on file     Review of Systems: General: negative for chills, fever, night sweats or weight changes.  Cardiovascular: negative for chest pain, dyspnea on exertion, edema, orthopnea, palpitations, paroxysmal nocturnal dyspnea or shortness of breath Dermatological: negative for rash Respiratory: negative for cough or wheezing Urologic: negative for hematuria Abdominal: negative for nausea, vomiting, diarrhea, bright red blood per rectum, melena, or hematemesis Neurologic: negative for visual changes, syncope, or dizziness All other systems reviewed and are otherwise negative except as noted above.    Blood pressure 122/76, pulse 65, height 5\' 8"  (1.727 m), weight 147 lb 1.6 oz (66.724 kg).  General appearance: alert and no distress Neck: no adenopathy, no carotid bruit, no JVD, supple, symmetrical, trachea midline and thyroid not enlarged, symmetric, no tenderness/mass/nodules Lungs: clear to auscultation bilaterally Heart: regular rate and rhythm, S1, S2 normal, no murmur, click, rub or gallop Extremities: extremities normal, atraumatic, no cyanosis or edema  EKG normal sinus rhythm at 65 with nonspecific ST and T-wave changes  ASSESSMENT AND PLAN:   CAD, NATIVE VESSEL Because of accelerated dyspnea and exertional arm and shoulder pain I performed cardiac catheterization via the right radial approach on 11/05/13. This revealed a 50% left main with damping using a 5 French catheter, 75% proximal eccentric LAD and 80% calcified ostial circumflex. He had 50% diffuse "in-stent restenosis within the mid RCA stent with normal LV function. He will not tolerate it well like nitrate and because of COPD am hesitant to put him on a beta blocker. He has surgical anatomy. I am referring him to TCT S. For surgical evaluation .      Glenn Harp MD FACP,FACC,FAHA, [facility] 11/09/2013 2:06 PM

## 2013-11-09 NOTE — Assessment & Plan Note (Signed)
Because of accelerated dyspnea and exertional arm and shoulder pain I performed cardiac catheterization via the right radial approach on 11/05/13. This revealed a 50% left main with damping using a 5 French catheter, 75% proximal eccentric LAD and 80% calcified ostial circumflex. He had 50% diffuse "in-stent restenosis within the mid RCA stent with normal LV function. He will not tolerate it well like nitrate and because of COPD am hesitant to put him on a beta blocker. He has surgical anatomy. I am referring him to TCT S. For surgical evaluation .

## 2013-11-10 ENCOUNTER — Encounter: Payer: Self-pay | Admitting: Thoracic Surgery (Cardiothoracic Vascular Surgery)

## 2013-11-10 ENCOUNTER — Encounter (HOSPITAL_COMMUNITY): Payer: Medicare Other

## 2013-11-10 ENCOUNTER — Other Ambulatory Visit: Payer: Self-pay | Admitting: *Deleted

## 2013-11-10 ENCOUNTER — Ambulatory Visit: Payer: Medicare Other | Admitting: Cardiology

## 2013-11-10 ENCOUNTER — Institutional Professional Consult (permissible substitution) (INDEPENDENT_AMBULATORY_CARE_PROVIDER_SITE_OTHER): Payer: Medicare Other | Admitting: Thoracic Surgery (Cardiothoracic Vascular Surgery)

## 2013-11-10 VITALS — BP 121/62 | HR 66 | Resp 20 | Ht 68.0 in | Wt 150.0 lb

## 2013-11-10 DIAGNOSIS — I209 Angina pectoris, unspecified: Secondary | ICD-10-CM

## 2013-11-10 DIAGNOSIS — I2510 Atherosclerotic heart disease of native coronary artery without angina pectoris: Secondary | ICD-10-CM

## 2013-11-10 DIAGNOSIS — R222 Localized swelling, mass and lump, trunk: Secondary | ICD-10-CM

## 2013-11-10 DIAGNOSIS — I25119 Atherosclerotic heart disease of native coronary artery with unspecified angina pectoris: Secondary | ICD-10-CM

## 2013-11-10 DIAGNOSIS — Z85118 Personal history of other malignant neoplasm of bronchus and lung: Secondary | ICD-10-CM | POA: Diagnosis not present

## 2013-11-10 DIAGNOSIS — R918 Other nonspecific abnormal finding of lung field: Secondary | ICD-10-CM

## 2013-11-10 DIAGNOSIS — I69991 Dysphagia following unspecified cerebrovascular disease: Secondary | ICD-10-CM | POA: Diagnosis not present

## 2013-11-10 DIAGNOSIS — I69391 Dysphagia following cerebral infarction: Secondary | ICD-10-CM | POA: Insufficient documentation

## 2013-11-10 NOTE — Progress Notes (Signed)
[address]       [place] [address]             [phone]     CARDIOTHORACIC SURGERY CONSULTATION REPORT  Referring Soo Steelman is Lorretta Harp, MD PCP is Townsend Roger, MD  Chief Complaint  Patient presents with  . Coronary Artery Disease    Surgical eval, Cardiac Cath 11/05/13    HPI:  Patient is an 80 year old widowed white male from [place] with known history of coronary artery disease, hypertension, hyperlipidemia, cerebrovascular disease with previous stroke, PVD, COPD, remote history of tobacco abuse, remote history of lung cancer treated with right lower lobectomy, and recently discovered enlarging mass in the left upper lobe worrisome for new primary lung cancer who has been referred for possible surgical treatment of coronary artery disease. The patient's history of coronary artery disease dates back to 1999 when he first presented with symptoms of angina pectoris. He was treated with PCI and stenting of the right coronary artery by Dr. Olevia Perches. He was followed for years by Dr. Olevia Perches and has done well from a cardiac standpoint until recently. In October of 2014 the patient had a brief episode of chest discomfort and was evaluated by Dr. Gwenlyn Found. Nuclear stress test was performed and felt to be low risk for ischemia. 2-D echocardiogram revealed normal left ventricular function without any segmental wall motion abnormalities. The patient was treated medically.  Over the last month the patient has developed accelerating symptoms of dull pain in the left upper extremity associated with physical exertion and exertional shortness of breath. Symptoms were reportedly similar to that which occurred prior to stenting in the remote past.  The patient was seen in followup by Dr. Gwenlyn Found who felt symptoms were concerning for angina pectoris. Diagnostic cardiac catheterization was performed 11/05/2013 and notable for the presence of 50% stenosis of left main coronary  artery with high grade ostial stenosis of left circumflex coronary artery, 75% proximal stenosis of left anterior descending coronary artery and long 50% restenosis of the right coronary artery. Left ventricular systolic function remains normal. The patient has been referred for surgical consultation.  The patient is widowed and lives alone in [place]. His son lives next door. The patient has remained reasonably active and functionally independent. He drives an automobile and does his own cooking, cleaning and yard work. He describes recent progression of exertional symptoms of pain in both arms radiating across his shoulders associated with exertional shortness of breath.  Initially symptoms started in the left arm but more recently have been involving the right arm and upper back.  Pain is described as dull, constant, and associated with worsening shortness of breath.  Patient has not had associated symptoms of chest discomfort. Symptoms are always relieved by rest but seem to have been developing with less and less activity. He denies any nocturnal symptoms. He denies any history of PND, orthopnea, dizzy spells or syncope.  He has had some palpitations and mild lower extremity edema.  He has been under somewhat increased stress recently because of the recently discovered enlarging mass in the left lung worrisome for lung cancer.  Past Medical History  Diagnosis Date  . PVD (peripheral vascular disease) 2011    carotid endarterectomy, pv stents  . Stroke 07/2009    left carotid endarterectomy  . Lung cancer 2001    lower right lobe surgery to remove cancer, no chemo or radiation  . HTN (hypertension)   .  Hyperlipemia   . Carotid artery disease   . Detached retina   . Cataract   . COPD (chronic obstructive pulmonary disease)   . Asbestosis   . CAD, [facility] PCI 2004 Cath 11/05/2013 with 50% LMain, 75% prox LAD, 80% prox LCx, 50% RCA with normal EF    . 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 Lung mass 10/19/2013    CT of the chest 10/12/2013 reveals an enlarging left upper lobe irregular nodule. Now 1.4 compared to 1.0 cm in diameter when compared to March 2015 CT scan. There is calcified pleural plaques seen bilaterally. There is subpleural scarring seen as well. Previous right lower lobectomy has been demonstrated.  PET-CT scan from June 2015 is reviewed this shows increased uptake (SUV > 8) in left upper   . HYPERLIPIDEMIA-MIXED 01/13/2010    Qualifier: Diagnosis of  By: Olevia Perches, MD, Glenetta Hew   . COPD with emphysema 01/13/2010    Spirometry 10/19/2013 is reviewed. This reveals FEV1 44% predicted FVC 47% predicted and FEF 25 7537% predicted   . PVD 01/13/2010    Qualifier: Diagnosis of  By: Olevia Perches, MD, Glenetta Hew   . HYPERTENSION, BENIGN 01/13/2010    Qualifier: Diagnosis of  By: Olevia Perches, MD, Glenetta Hew   . Dysphagia as late effect of stroke     Developed as complication of stroke following left carotid endarterectomy    Past Surgical History  Procedure Laterality Date  . Appendectomy    . Hemorrhoid surgery    . 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 Video assisted thoracoscopy (vats)/empyema Right 07/13/1999    drainage of post-operative empyema following RLLobectomy  . Carotid endarterectomy Left 2011    [facility] - complicated by post-op stroke  . Iliac artery stent Bilateral 2011    [facility] - performed for asymptomatic iliac artery disease  . Fracture surgery Left     foot  . Prostate surgery      TURP  . Percutaneous coronary stent intervention (pci-s)  1999    RCA    Family History  Problem Relation Age of Onset  . Heart attack Mother   . Heart attack Father   .  Cancer Brother     esophagus and lung  . Cancer Brother     lung    History   Social History  . Marital Status: Widowed    Spouse Name: N/A    Number of Children: N/A  . Years of Education: N/A   Occupational History  . 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 Smoking status: Former Smoker -- 1.00 packs/day for 30 years    Types: Cigarettes    Quit date: 03/03/1979  . Smokeless tobacco: Current User    Types: Chew  . Alcohol Use: Not on file  . Drug Use: Not on file  . Sexual Activity: Not on file   Other Topics Concern  . Not on file   Social History Narrative  . No narrative on file    Current Outpatient Prescriptions  Medication Sig Dispense Refill  . albuterol (PROVENTIL HFA;VENTOLIN HFA) 108 (90  BASE) MCG/ACT inhaler Inhale 2 puffs into the lungs every 6 (six) hours as needed for wheezing or shortness of breath.      Marland Kitchen amLODipine (NORVASC) 2.5 MG tablet Take 1 tablet (2.5 mg total) by mouth daily.  180 tablet  3  . aspirin 81 MG tablet Take 81 mg by mouth daily.      . furosemide (LASIX) 20 MG tablet Take 20 mg by mouth daily.      . hydrALAZINE (APRESOLINE) 25 MG tablet Take 25 mg by mouth 3 (three) times daily.      Marland Kitchen ipratropium-albuterol (DUONEB) 0.5-2.5 (3) MG/3ML SOLN Take 3 mLs by nebulization 3 (three) times daily.      Marland Kitchen levothyroxine (SYNTHROID, LEVOTHROID) 88 MCG tablet Take 88 mcg by mouth daily before breakfast.      . lisinopril (PRINIVIL,ZESTRIL) 40 MG tablet Take 40 mg by mouth daily.      Marland Kitchen OVER THE COUNTER MEDICATION Take 1 tablet by mouth daily. Mega Red Omega 3 capsule      . predniSONE (DELTASONE) 10 MG tablet Take 10 mg by mouth daily.       No current facility-administered medications for this visit.    Allergies  Allergen Reactions  . Demerol [Meperidine] Other (See Comments)    shock      Review of Systems:   General:  normal appetite, decreased energy, no weight gain, + 8 lb  weight loss, no fever  Cardiac:  no chest pain with exertion, no chest pain at rest, + SOB with exertion, no resting SOB, no PND, no orthopnea, + palpitations, no arrhythmia, no atrial fibrillation, + mild LE edema, no dizzy spells, no syncope  Respiratory:  + exertional shortness of breath, no home oxygen, no productive cough, + chronic dry cough, + bronchitis, NO wheezing, no hemoptysis, no asthma, no pain with inspiration or cough, no sleep apnea, no CPAP at night  GI:   + chronic difficulty swallowing related to previous stroke, + reflux, no frequent heartburn, no hiatal hernia, no abdominal pain, + constipation, no diarrhea, no hematochezia, no hematemesis, no melena  GU:   no dysuria,  + frequency, no urinary tract infection, no hematuria, no enlarged prostate, no kidney stones, no kidney disease  Vascular:  no pain suggestive of claudication, no pain in feet, no leg cramps, no varicose veins, no DVT, no non-healing foot ulcer  Neuro:   + stroke, no TIA's, no seizures, no headaches, no temporary blindness one eye,  no slurred speech, no peripheral neuropathy, no chronic pain, no instability of gait, no memory/cognitive dysfunction  Musculoskeletal: mild arthritis, no joint swelling, no myalgias, no difficulty walking, normal mobility   Skin:   no rash, + itching, no skin infections, no pressure sores or ulcerations  Psych:   no anxiety, no depression, no nervousness, + unusual recent stress  Eyes:   no blurry vision, no floaters, no recent vision changes, + wears glasses or contacts  ENT:   + hearing loss, no loose or painful teeth, = dentures, last saw dentist 3 months ago  Hematologic:  + easy bruising, no abnormal bleeding, no clotting disorder, no frequent epistaxis  Endocrine:  no diabetes, does not check CBG's at home     Physical Exam:   BP 121/62  Pulse 66  Resp 20  Ht 5\' 8"  (1.727 m)  Wt 150 lb (68.04 kg)  BMI 22.81 kg/m2  SpO2 96%  General:  Thin, elderly but o/w  well-appearing  HEENT:  Unremarkable  Neck:   no JVD, no bruits, no adenopathy   Chest:   clear to auscultation, symmetrical breath sounds, no wheezes, no rhonchi   CV:   RRR, no murmur   Abdomen:  soft, non-tender, no masses   Extremities:  warm, well-perfused, pulses not palpable, no LE edema  Rectal/GU  Deferred  Neuro:   Grossly non-focal and symmetrical throughout  Skin:   Clean and dry, no rashes, no breakdown   Diagnostic Tests:  Transthoracic Echocardiography  Patient: Lavonne, Cass MR #: [id number] Study Date: 03/24/2013 Gender: M Age: 82 Height: 172.7cm Weight: 71.2kg BSA: 1.108m^2 Pt. Status: Room:  ATTENDING Quay Burow, MD ORDERING Quay Burow, MD REFERRING Quay Burow, MD SONOGRAPHER Marygrace Drought, RCS PERFORMING [facility] cc:  ------------------------------------------------------------  ------------------------------------------------------------ Indications: 414.01 Coronary atherosclerosis - native artery.  ------------------------------------------------------------ History: PMH: PVD Chest pain.  ------------------------------------------------------------ Study Conclusions  - Left ventricle: The cavity size was normal. Wall thickness was increased in a pattern of mild LVH. Systolic function was normal. Wall motion was normal; there were no regional wall motion abnormalities. - Aortic valve: Mild regurgitation. - Mitral valve: Valve area by pressure half-time: 1.79cm^2. - Left atrium: The atrium was mildly dilated. Transthoracic echocardiography. M-mode, complete 2D, spectral Doppler, and color Doppler. Height: Height: 172.7cm. Height: 68in. Weight: Weight: 71.2kg. Weight: 156.7lb. Body mass index: BMI: 23.9kg/m^2. Body surface area: BSA: 1.80m^2. Blood pressure: 168/90. Patient status: Outpatient. Location: Echo  laboratory.  ------------------------------------------------------------  ------------------------------------------------------------ Left ventricle: The cavity size was normal. Wall thickness was increased in a pattern of mild LVH. Systolic function was normal. Wall motion was normal; there were no regional wall motion abnormalities. Abnormal relaxation with increased filling pressures.  ------------------------------------------------------------ Aortic valve: Trileaflet; normal thickness leaflets. Mobility was not restricted. Doppler: Transvalvular velocity was within the normal range. There was no stenosis. Mild regurgitation. VTI ratio of LVOT to aortic valve: 0.56. Peak velocity ratio of LVOT to aortic valve: 0.63. Mean gradient: 19mm Hg (S).  ------------------------------------------------------------ Aorta: Aortic root: The aortic root was normal in size.  ------------------------------------------------------------ Mitral valve: Structurally normal valve. Mobility was not restricted. Doppler: Transvalvular velocity was within the normal range. There was no evidence for stenosis. No regurgitation. Valve area by pressure half-time: 1.79cm^2. Indexed valve area by pressure half-time: 0.97cm^2/m^2. Mean gradient: 31mm Hg (D).  ------------------------------------------------------------ Left atrium: LA Volume/ BSA = 31.0 ml/m2 The atrium was mildly dilated.  ------------------------------------------------------------ Right ventricle: The cavity size was normal. Wall thickness was normal. Systolic function was normal.  ------------------------------------------------------------ Pulmonic valve: Doppler: Transvalvular velocity was within the normal range. There was no evidence for stenosis.  ------------------------------------------------------------ Tricuspid valve: Structurally normal valve. Doppler: Transvalvular velocity was within the normal range.  Trivial regurgitation.  ------------------------------------------------------------ Pulmonary artery: The main pulmonary artery was normal-sized. Systolic pressure was within the normal range.  ------------------------------------------------------------ Right atrium: The atrium was normal in size.  ------------------------------------------------------------ Pericardium: There was no pericardial effusion.  ------------------------------------------------------------ Systemic veins: Inferior vena cava: The vessel was normal in size; the respirophasic diameter changes were in the normal range (= 50%); findings are consistent with normal central venous pressure. Diameter: 3mm.  ------------------------------------------------------------  2D measurements Normal Doppler measurements Normal IVC Main pulmonary Diam 13 mm ------ artery Left ventricle Pressure, 25 mm Hg =30 LVID ED, 52.6 mm 43-52 S chord, Left ventricle PLAX Ea, lat 4.3 cm/s ------ LVID ES, 30.6 mm 23-38 ann, tiss chord, DP PLAX E/Ea, lat 15.6 ------ FS, chord, 42 % >29 ann, tiss PLAX DP LVPW, ED 10.9 mm ------ Ea, med 4.2 cm/s ------  IVS/LVPW 1.04 <1.3 ann, tiss ratio, ED DP Ventricular septum E/Ea, med 15.9 ------ IVS, ED 11.3 mm ------ ann, tiss 8 Aorta DP Root diam, 26 mm ------ LVOT ED Peak vel, 89.5 cm/s ------ Left atrium S AP dim 39 mm ------ VTI, S 23.6 cm ------ AP dim 2.12 cm/m^2 <2.2 Aortic valve index Peak vel, 143 cm/s ------ S Mean vel, 104 cm/s ------ S VTI, S 42.1 cm ------ Mean 5 mm Hg ------ gradient, S VTI ratio 0.56 ------ LVOT/AV Peak vel 0.63 ------ ratio, LVOT/AV Regurg PHT 491 ms ------ Mitral valve Peak E vel 67.1 cm/s ------ Peak A vel 110 cm/s ------ Mean vel, 74.8 cm/s ------ D Decelerati 394 ms 150-23 on time 0 Pressure 123 ms ------ half-time Mean 3 mm Hg ------ gradient, D Peak E/A 0.6 ------ ratio Area (PHT) 1.79 cm^2 ------ Area index 0.97 cm^2/m  ------ (PHT) ^2 Annulus 52.8 cm ------ VTI Tricuspid valve Regurg 226 cm/s ------ peak vel Peak RV-RA 20 mm Hg ------ gradient, S Systemic veins Estimated 5 mm Hg ------ CVP Right ventricle Pressure, 25 mm Hg <30 S Sa vel, 11.6 cm/s ------ lat ann, tiss DP  ------------------------------------------------------------ Prepared and Electronically Authenticated by  Croitoru, Mihai [id number]:[id number]         CARDIAC CATHETERIZATION   PROCEDURE DESCRIPTION:  The patient was brought to the second floor [facility]iac cath lab in the postabsorptive state. He was premedicated with Valium 5 mg by mouth, IV Versed and fentanyl. His right wristwas prepped and shaved in usual sterile fashion.Xylocaine 1% was used for local anesthesia. A 5 French sheath was inserted into the right radial artery using standard Seldinger technique. The patient received  3500 units of heparin intravenously. Using a 5 [place] TIG catheter and ultimately a 5 [place] FL3 guide catheter and pigtail catheter selective coronary angiography and left ventriculography were performed. Omnipaque dye was usedfor the entirety of the case.retrograde aortic, left ventricular and pullback pressures were recorded.  HEMODYNAMICS:  AO SYSTOLIC/AO DIASTOLIC: 191/47  LV SYSTOLIC/LV DIASTOLIC: 829/56  ANGIOGRAPHIC RESULTS:  1. Left main; 50% diffuse damping using a 5 [place] catheter  2. LAD; 75% focal proximal fluoroscopic calcification  3. Left circumflex; 75-80% with ostial fluoroscopic calcification.  4. Right coronary artery; dominant with a long 50% segmental mid stenosis  5. Left ventriculography; RAO left ventriculogram was performed using  25 mL of Visipaque dye at 12 mL/second. The overall LVEF estimated  70 % Without wall motion abnormalities  IMPRESSION:Mr. Dockstader has 3 vessel disease with moderate RCA disease, moderate left main, and significant proximal LAD and circumflex disease normal LV function.  He does have progressive symptoms. In going to review his intravenous bicarbonate prior to making a recommendation that I am leaning towards coronary bypass grafting. The radial sheath was removed and a TR Band was placed on the right wrist to achieve patent hemostasis. The patient left the Cath Lab in stable condition. He will be hydrated for 2 hours and discharged home. I will see him back in the office in one to 2 weeks for followup  Lorretta Harp. MD, [facility]  11/05/2013  5:22 PM    Pulmonary Function Tests  10/19/2013        FVC  1.85 L  (47% predicted)  FEV1  1.29 L  (44% predicted)  FEF25-75 0.95 L  (37% predicted)     CT CHEST WITH CONTRAST  TECHNIQUE: Multidetector CT imaging of the chest was performed during intravenous contrast administration.  CONTRAST: 60  ml Isovue 370.  COMPARISON: DG CHEST 2V dated 02/18/2013; CT CHEST W/ CM dated 12/30/2012; CT ANGIO CHEST dated 02/08/2009; CT CHEST-ABD-PELV W/ CM dated 07/20/2011  FINDINGS: There are no enlarged mediastinal, hilar or axillary lymph nodes. Extensive atherosclerosis of the aorta, great vessels and coronary arteries is again noted. The heart size is normal. There is no pericardial effusion.  There are stable postsurgical changes status post right lower lobe resection. Adjacent loculated pleural effusion at the posterior right costophrenic angle is stable. There are stable calcified pleural plaques bilaterally with stable scattered scarring throughout the right lung. There is a 10 mm macro lobulated left upper lobe nodule on image 19, worrisome for malignancy. No other suspicious nodules are identified.  The visualized upper abdomen has a stable appearance with grossly stable low-density hepatic and renal lesions bilaterally. There is no adrenal mass. Chronic T11 compression deformity is unchanged.  IMPRESSION: 1. 10 mm left upper lobe nodule suspicious for malignancy. This could represent a solitary metastasis or  a new primary bronchogenic carcinoma. 2. No other evidence of metastatic disease. PET-CT may be helpful for further staging. 3. Stable postsurgical changes in the right hemithorax with calcified pleural plaques bilaterally. 4. Extensive atherosclerosis. 5. These results will be called to the ordering clinician or representative by the Radiologist Assistant, and communication documented in the PACS Dashboard.   Electronically Signed By: Camie Patience M.D. On: 07/14/2013 12:16    NUCLEAR MEDICINE PET SKULL BASE TO THIGH  TECHNIQUE: 14.8 mCi F-18 FDG was injected intravenously. Full-ring PET imaging was performed from the skull base to thigh after the radiotracer. CT data was obtained and used for attenuation correction and anatomic localization.  FASTING BLOOD GLUCOSE: Value: 74 mg/dl  COMPARISON: Chest CT on 10/12/2013  FINDINGS: NECK  No hypermetabolic lymph nodes in the neck.  CHEST  No hypermetabolic mediastinal or hilar nodes. 1.4 cm spiculated nodule in the left upper lobe is hypermetabolic, with SUV max of 8.2. No other suspicious pulmonary nodules seen by CT. Postop changes again seen in the right hemithorax.  ABDOMEN/PELVIS  No abnormal hypermetabolic activity within the liver, pancreas, adrenal glands, or spleen. No hypermetabolic lymph nodes in the abdomen or pelvis. Sigmoid diverticulosis incidentally noted.  SKELETON  No focal hypermetabolic activity to suggest skeletal metastasis.  IMPRESSION: 1.4 cm hypermetabolic left upper lobe pulmonary nodule, with SUV max of 8.2. This consistent with primary bronchogenic carcinoma.  No evidence of thoracic nodal or distant metastatic disease.   Electronically Signed By: Earle Gell M.D. On: 10/13/2013 12:21    MRI HEAD WITHOUT AND WITH CONTRAST  TECHNIQUE: Multiplanar, multiecho pulse sequences of the brain and surrounding structures were obtained without and with intravenous contrast.  CONTRAST:  14 cc MultiHance  COMPARISON: Head CT 07/23/2011  FINDINGS: Diffusion imaging does not show any acute or subacute infarction. Brainstem is normal. The cerebellum is normal except for a developmental venous anomaly on the left of no clinical significance. The cerebral hemispheres show mild chronic small-vessel change affecting the deep and subcortical white matter, of a degree frequently seen an individuals of this age. No cortical or large vessel territory infarction. No intra-axial mass lesion, hemorrhage, hydrocephalus or extra-axial collection. No skull or skullbase lesion is seen.  IMPRESSION: No evidence of metastatic disease. Ordinary age related atrophy and chronic small vessel change. Small developmental venous anomaly of the left cerebellum without evidence of previous hemorrhage, not of any clinical significance.   Electronically Signed By: Nelson Chimes M.D. On: 10/16/2013 13:38  Impression:  The patient has left main disease with severe three-vessel coronary artery disease and normal left ventricular systolic function. He presents with accelerating symptoms of exertional shortness of breath and pain in both arms and upper back that is highly suspicious for angina pectoris. Concurrently the patient has a recently discovered enlarging mass within the left upper lobe that is highly suspicious for primary lung cancer. The mass currently measures 1.4 cm in its greatest diameter which has increased significantly in size from 1.0 cm over the past 6 months. In addition, PET imaging revealed increased metabolic activity in the mass with maxSUV >8.  The patient has a remote history of lung cancer treated surgically in the distant past with right lower lobectomy.  In addition, he has significant COPD with chronic airway obstruction and exertional shortness of breath. The mass in the left upper lobe is deep within the lung parenchyma and could not be treated with limited surgical  resection.  Because of his advanced age, numerous comorbid medical problems, previous right lower lobectomy, and significant chronic lung disease, he would not be considered candidate for left upper lobectomy as treatment of his presumed new primary lung cancer.  However, there is no sign of locally advanced nor distant metastatic disease (T1aN0M0 clinical stage IA) and the patient might be a reasonably good candidate for stereotactic body radiation therapy (SBRT).     Plan:  I have discussed options at length with the patient and his family in the office today. I also discussed treatment options for management of the patient's coronary artery disease over the telephone with Dr. Gwenlyn Found. Unfortunately, the patient's coronary anatomy appears relatively unfavorable for percutaneous coronary intervention because of the significant stenosis of the left main coronary artery with catheter damping upon engagement during catheterization.  In addition, there is high-grade ostial stenosis of the left circumflex coronary artery extending into the left main coronary artery. An attempt at PCI and stenting would be associated with fairly high-risk.  Overall the patient appears to be reasonably good candidate for coronary artery bypass grafting, although there is no question that risks will be somewhat increased because of his advanced age and numerous comorbid medical problems.  Furthermore, any significant complications following coronary artery bypass surgery might delay definitive treatment of the patient's presumed lung cancer.  However, given the patient's coronary anatomy and accelerating symptoms of angina pectoris, his coronary artery disease probably needs definitive management first.  It seems unlikely that he would do well with an attempt at medical therapy for treatment of his coronary artery disease to allow further workup and treatment of his lung cancer in the immediate future.  We will attempt to obtain records  from the patient's otolaryngologist in [place] regarding his history of chronic dysphagia to address the potential associated risks of aspiration during his early recovery following surgery. The patient will return for followup on Monday, 11/16/2013.   We will plan we plan for elective coronary artery bypass grafting on Thursday, 11/19/2013.    I spent in excess of 90 minutes during the conduct of this office consultation and >50% of this time involved direct face-to-face encounter with the patient for counseling and/or coordination of their care.   Valentina Gu. Roxy Manns, MD 11/10/2013 2:25 PM

## 2013-11-12 ENCOUNTER — Encounter (HOSPITAL_COMMUNITY): Payer: Self-pay

## 2013-11-13 DIAGNOSIS — J0100 Acute maxillary sinusitis, unspecified: Secondary | ICD-10-CM | POA: Diagnosis not present

## 2013-11-13 DIAGNOSIS — R05 Cough: Secondary | ICD-10-CM | POA: Diagnosis not present

## 2013-11-13 DIAGNOSIS — R059 Cough, unspecified: Secondary | ICD-10-CM | POA: Diagnosis not present

## 2013-11-17 ENCOUNTER — Encounter (HOSPITAL_COMMUNITY): Payer: Medicare Other

## 2013-11-17 ENCOUNTER — Encounter (HOSPITAL_COMMUNITY)
Admission: RE | Admit: 2013-11-17 | Discharge: 2013-11-17 | Disposition: A | Discharge status: Home / Self Care | Payer: Medicare Other | Source: Ambulatory Visit | Attending: Thoracic Surgery (Cardiothoracic Vascular Surgery) | Admitting: Thoracic Surgery (Cardiothoracic Vascular Surgery)

## 2013-11-17 ENCOUNTER — Ambulatory Visit (HOSPITAL_COMMUNITY)
Admission: RE | Admit: 2013-11-17 | Discharge: 2013-11-17 | Disposition: A | Discharge status: Home / Self Care | Payer: Medicare Other | Source: Ambulatory Visit | Attending: Emergency Medicine | Admitting: Emergency Medicine

## 2013-11-17 ENCOUNTER — Encounter (HOSPITAL_COMMUNITY): Payer: Self-pay

## 2013-11-17 ENCOUNTER — Ambulatory Visit: Payer: Medicare Other | Admitting: Thoracic Surgery (Cardiothoracic Vascular Surgery)

## 2013-11-17 VITALS — BP 181/65 | HR 64 | Temp 97.9°F | Resp 20 | Ht 68.0 in | Wt 148.5 lb

## 2013-11-17 DIAGNOSIS — Z01818 Encounter for other preprocedural examination: Secondary | ICD-10-CM | POA: Insufficient documentation

## 2013-11-17 DIAGNOSIS — Z01810 Encounter for preprocedural cardiovascular examination: Secondary | ICD-10-CM

## 2013-11-17 DIAGNOSIS — R911 Solitary pulmonary nodule: Secondary | ICD-10-CM

## 2013-11-17 DIAGNOSIS — I2510 Atherosclerotic heart disease of native coronary artery without angina pectoris: Secondary | ICD-10-CM

## 2013-11-17 HISTORY — DX: Unspecified asthma, uncomplicated: J45.909

## 2013-11-17 HISTORY — DX: Other complications of anesthesia, initial encounter: T88.59XA

## 2013-11-17 HISTORY — DX: Hypothyroidism, unspecified: E03.9

## 2013-11-17 HISTORY — DX: Adverse effect of unspecified anesthetic, initial encounter: T41.45XA

## 2013-11-17 HISTORY — DX: Activated protein C resistance: D68.51

## 2013-11-17 LAB — CBC
HCT: 41.8 % (ref 39.0–52.0)
HEMOGLOBIN: 13.9 g/dL (ref 13.0–17.0)
MCH: 30.5 pg (ref 26.0–34.0)
MCHC: 33.3 g/dL (ref 30.0–36.0)
MCV: 91.7 fL (ref 78.0–100.0)
Platelets: 188 10*3/uL (ref 150–400)
RBC: 4.56 MIL/uL (ref 4.22–5.81)
RDW: 14.3 % (ref 11.5–15.5)
WBC: 6.3 10*3/uL (ref 4.0–10.5)

## 2013-11-17 LAB — SURGICAL PCR SCREEN
MRSA, PCR: POSITIVE — AB
STAPHYLOCOCCUS AUREUS: POSITIVE — AB

## 2013-11-17 LAB — BLOOD GAS, ARTERIAL
ACID-BASE EXCESS: 2 mmol/L (ref 0.0–2.0)
Bicarbonate: 25.9 mEq/L — ABNORMAL HIGH (ref 20.0–24.0)
Drawn by: 344381
O2 Content: 0.2 L/min
O2 SAT: 97.5 %
PATIENT TEMPERATURE: 98.6
TCO2: 27.1 mmol/L (ref 0–100)
pCO2 arterial: 39.2 mmHg (ref 35.0–45.0)
pH, Arterial: 7.434 (ref 7.350–7.450)
pO2, Arterial: 92.5 mmHg (ref 80.0–100.0)

## 2013-11-17 LAB — URINALYSIS, ROUTINE W REFLEX MICROSCOPIC
Bilirubin Urine: NEGATIVE
Glucose, UA: NEGATIVE mg/dL
Hgb urine dipstick: NEGATIVE
Ketones, ur: NEGATIVE mg/dL
LEUKOCYTES UA: NEGATIVE
NITRITE: NEGATIVE
PROTEIN: NEGATIVE mg/dL
SPECIFIC GRAVITY, URINE: 1.017 (ref 1.005–1.030)
Urobilinogen, UA: 0.2 mg/dL (ref 0.0–1.0)
pH: 6 (ref 5.0–8.0)

## 2013-11-17 LAB — COMPREHENSIVE METABOLIC PANEL
ALK PHOS: 65 U/L (ref 39–117)
ALT: 16 U/L (ref 0–53)
AST: 20 U/L (ref 0–37)
Albumin: 3.6 g/dL (ref 3.5–5.2)
Anion gap: 17 — ABNORMAL HIGH (ref 5–15)
BUN: 27 mg/dL — ABNORMAL HIGH (ref 6–23)
CHLORIDE: 104 meq/L (ref 96–112)
CO2: 20 meq/L (ref 19–32)
Calcium: 9.8 mg/dL (ref 8.4–10.5)
Creatinine, Ser: 1.28 mg/dL (ref 0.50–1.35)
GFR, EST AFRICAN AMERICAN: 58 mL/min — AB (ref >90)
GFR, EST NON AFRICAN AMERICAN: 50 mL/min — AB (ref >90)
GLUCOSE: 94 mg/dL (ref 70–99)
Potassium: 4.4 mEq/L (ref 3.7–5.3)
SODIUM: 141 meq/L (ref 137–147)
Total Protein: 6.6 g/dL (ref 6.0–8.3)

## 2013-11-17 LAB — PROTIME-INR
INR: 1.24 (ref 0.00–1.49)
Prothrombin Time: 15.6 seconds — ABNORMAL HIGH (ref 11.6–15.2)

## 2013-11-17 LAB — TYPE AND SCREEN
ABO/RH(D): A POS
ANTIBODY SCREEN: NEGATIVE

## 2013-11-17 LAB — HEMOGLOBIN A1C
Hgb A1c MFr Bld: 5.7 % — ABNORMAL HIGH (ref <5.7)
Mean Plasma Glucose: 117 mg/dL — ABNORMAL HIGH (ref <117)

## 2013-11-17 LAB — APTT: aPTT: 32 seconds (ref 24–37)

## 2013-11-17 LAB — ABO/RH: ABO/RH(D): A POS

## 2013-11-17 MED ORDER — CHLORHEXIDINE GLUCONATE 4 % EX LIQD: 30.0000 mL | CUTANEOUS | Status: DC

## 2013-11-17 NOTE — Progress Notes (Signed)
Dr Ricard Dillon saw pt with pat visit. Will use pfts done by Dr Joya Gaskins.

## 2013-11-17 NOTE — H&P (Signed)
[address]       [place] [address]             [phone]          CARDIOTHORACIC SURGERY HISTORY AND PHYSICAL EXAM  Referring Provider is Lorretta Harp, MD PCP is Townsend Roger, MD    Chief Complaint   Patient presents with   .  Coronary Artery Disease       Surgical eval, Cardiac Cath 11/05/13     HPI:  Patient is an 80 year old widowed white male from [place] with known history of coronary artery disease, hypertension, hyperlipidemia, cerebrovascular disease with previous stroke, PVD, COPD, remote history of tobacco abuse, remote history of lung cancer treated with right lower lobectomy, and recently discovered enlarging mass in the left upper lobe worrisome for new primary lung cancer who has been referred for possible surgical treatment of coronary artery disease. The patient's history of coronary artery disease dates back to 1999 when he first presented with symptoms of angina pectoris. He was treated with PCI and stenting of the right coronary artery by Dr. Olevia Perches. He was followed for years by Dr. Olevia Perches and has done well from a cardiac standpoint until recently. In October of 2014 the patient had a brief episode of chest discomfort and was evaluated by Dr. Gwenlyn Found. Nuclear stress test was performed and felt to be low risk for ischemia. 2-D echocardiogram revealed normal left ventricular function without any segmental wall motion abnormalities. The patient was treated medically.  Over the last month the patient has developed accelerating symptoms of dull pain in the left upper extremity associated with physical exertion and exertional shortness of breath. Symptoms were reportedly similar to that which occurred prior to stenting in the remote past.  The patient was seen in followup by Dr. Gwenlyn Found who felt symptoms were concerning for angina pectoris. Diagnostic cardiac catheterization was performed 11/05/2013 and notable for the presence of 50% stenosis of  left main coronary artery with high grade ostial stenosis of left circumflex coronary artery, 75% proximal stenosis of left anterior descending coronary artery and long 50% restenosis of the right coronary artery. Left ventricular systolic function remains normal.   The patient is widowed and lives alone in [place]. His son lives next door. The patient has remained reasonably active and functionally independent. He drives an automobile and does his own cooking, cleaning and yard work. He describes recent progression of exertional symptoms of pain in both arms radiating across his shoulders associated with exertional shortness of breath.  Initially symptoms started in the left arm but more recently have been involving the right arm and upper back.  Pain is described as dull, constant, and associated with worsening shortness of breath.  Patient has not had associated symptoms of chest discomfort. Symptoms are always relieved by rest but seem to have been developing with less and less activity. He denies any nocturnal symptoms. He denies any history of PND, orthopnea, dizzy spells or syncope.  He has had some palpitations and mild lower extremity edema.  He has been under somewhat increased stress recently because of the recently discovered enlarging mass in the left lung worrisome for lung cancer.  The patient was referred for surgical consultation and originally seen in consultation on 11/10/2013 and he now returns with tentative plans to proceed with surgery later this week.  He states that last week he developed mild nasal congestion and a cough without associated fever or malaise.  He was seen by urgent care and treated with a 5 day course of oral Levaquin. He states that he now feels much better here the cough is improved. He has not had any fevers or chills. He continues to have intermittent pain in both arms, more on the right side than the left. This pain is exacerbated with physical exertion and relieved  by rest. He has not had any prolonged episodes of pain.   Past Medical History  Diagnosis Date  . PVD (peripheral vascular disease) 2011    carotid endarterectomy, pv stents  . Stroke 07/2009    left carotid endarterectomy  . Lung cancer 2001    lower right lobe surgery to remove cancer, no chemo or radiation  . HTN (hypertension)   . Hyperlipemia   . Carotid artery disease   . Detached retina   . Cataract   . COPD (chronic obstructive pulmonary disease)   . Asbestosis   . CAD, [place] PCI 2004 Cath 11/05/2013 with 50% LMain, 75% prox LAD, 80% prox LCx, 50% RCA with normal EF    . 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 Lung mass 10/19/2013    CT of the chest 10/12/2013 reveals an enlarging left upper lobe irregular nodule. Now 1.4 compared to 1.0 cm in diameter when compared to March 2015 CT scan. There is calcified pleural plaques seen bilaterally. There is subpleural scarring seen as well. Previous right lower lobectomy has been demonstrated.  PET-CT scan from June 2015 is reviewed this shows increased uptake (SUV > 8) in left upper   . HYPERLIPIDEMIA-MIXED 01/13/2010    Qualifier: Diagnosis of  By: Olevia Perches, MD, Glenetta Hew   . COPD with emphysema 01/13/2010    Spirometry 10/19/2013 is reviewed. This reveals FEV1 44% predicted FVC 47% predicted and FEF 25 7537% predicted   . PVD 01/13/2010    Qualifier: Diagnosis of  By: Olevia Perches, MD, Glenetta Hew   . HYPERTENSION, BENIGN 01/13/2010    Qualifier: Diagnosis of  By: Olevia Perches, MD, Glenetta Hew   . Dysphagia as late effect of stroke     Developed as complication of stroke following left carotid endarterectomy    Past Surgical History  Procedure Laterality Date  . Appendectomy    . Hemorrhoid surgery    . 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 Video assisted thoracoscopy (vats)/empyema Right 07/13/1999    drainage of post-operative empyema following RLLobectomy  . Carotid endarterectomy Left 2011    [facility] - complicated by post-op stroke  . Iliac artery stent Bilateral 2011    [facility] - performed for asymptomatic iliac artery disease  . Fracture surgery Left     foot  . Prostate surgery      TURP  . Percutaneous coronary stent intervention (pci-s)  1999    RCA    Family History  Problem Relation Age of Onset  . Heart attack Mother   . Heart attack Father   . Cancer Brother     esophagus and lung  . Cancer Brother     lung    Social History History  Substance Use Topics  . Smoking status: Former Smoker --  1.00 packs/day for 30 years    Types: Cigarettes    Quit date: 03/03/1979  . Smokeless tobacco: Current User    Types: Chew  . Alcohol Use: Not on file    Prior to Admission medications   Medication Sig Start Date End Date Taking? Authorizing Provider  albuterol (PROVENTIL HFA;VENTOLIN HFA) 108 (90 BASE) MCG/ACT inhaler Inhale 2 puffs into the lungs every 6 (six) hours as needed for wheezing or shortness of breath.   Yes Historical Provider, MD  amLODipine (NORVASC) 2.5 MG tablet Take 1 tablet (2.5 mg total) by mouth daily. 10/06/13  Yes Lorretta Harp, MD  aspirin 81 MG tablet Take 81 mg by mouth daily.   Yes Historical Provider, MD  furosemide (LASIX) 20 MG tablet Take 20 mg by mouth daily.   Yes Historical Provider, MD  hydrALAZINE (APRESOLINE) 25 MG tablet Take 25 mg by mouth 3 (three) times daily.   Yes Historical Provider, MD  ipratropium-albuterol (DUONEB) 0.5-2.5 (3) MG/3ML SOLN Take 3 mLs by nebulization 3 (three) times daily.   Yes Historical Provider, MD  levothyroxine (SYNTHROID, LEVOTHROID) 88 MCG tablet Take 88 mcg by mouth daily before breakfast.   Yes Historical Provider, MD  lisinopril  (PRINIVIL,ZESTRIL) 40 MG tablet Take 40 mg by mouth daily.   Yes Historical Provider, MD  OVER THE COUNTER MEDICATION Take 1 tablet by mouth daily. Mega Red Omega 3 capsule   Yes Historical Provider, MD  predniSONE (DELTASONE) 10 MG tablet Take 10 mg by mouth daily.   Yes Historical Provider, MD    Allergies  Allergen Reactions  . Demerol [Meperidine] Other (See Comments)    shock    Review of Systems:              General:                      normal appetite, decreased energy, no weight gain, + 8 lb weight loss, no fever             Cardiac:                      no chest pain with exertion, no chest pain at rest, + SOB with exertion, no resting SOB, no PND, no orthopnea, + palpitations, no arrhythmia, no atrial fibrillation, + mild LE edema, no dizzy spells, no syncope             Respiratory:                + exertional shortness of breath, no home oxygen, no productive cough, + chronic dry cough, + bronchitis, NO wheezing, no hemoptysis, no asthma, no pain with inspiration or cough, no sleep apnea, no CPAP at night             GI:                                + chronic difficulty swallowing related to previous stroke, + reflux, no frequent heartburn, no hiatal hernia, no abdominal pain, + constipation, no diarrhea, no hematochezia, no hematemesis, no melena             GU:                              no dysuria,  + frequency, no urinary tract infection, no hematuria,  no enlarged prostate, no kidney stones, no kidney disease             Vascular:                     no pain suggestive of claudication, no pain in feet, no leg cramps, no varicose veins, no DVT, no non-healing foot ulcer             Neuro:                         + stroke, no TIA's, no seizures, no headaches, no temporary blindness one eye,  no slurred speech, no peripheral neuropathy, no chronic pain, no instability of gait, no memory/cognitive dysfunction             Musculoskeletal:         mild arthritis, no joint  swelling, no myalgias, no difficulty walking, normal mobility               Skin:                            no rash, + itching, no skin infections, no pressure sores or ulcerations             Psych:                         no anxiety, no depression, no nervousness, + unusual recent stress             Eyes:                           no blurry vision, no floaters, no recent vision changes, + wears glasses or contacts             ENT:                            + hearing loss, no loose or painful teeth, = dentures, last saw dentist 3 months ago             Hematologic:               + easy bruising, no abnormal bleeding, no clotting disorder, no frequent epistaxis             Endocrine:                   no diabetes, does not check CBG's at home                           Physical Exam:              BP 121/62  Pulse 66  Resp 20  Ht 5\' 8"  (1.727 m)  Wt 150 lb (68.04 kg)  BMI 22.81 kg/m2  SpO2 96%             General:                      Thin, elderly but o/w well-appearing             HEENT:                       Unremarkable  Neck:                           no JVD, no bruits, no adenopathy               Chest:                         clear to auscultation, symmetrical breath sounds, no wheezes, no rhonchi               CV:                              RRR, no murmur               Abdomen:                    soft, non-tender, no masses               Extremities:                 warm, well-perfused, pulses not palpable, no LE edema             Rectal/GU                   Deferred             Neuro:                         Grossly non-focal and symmetrical throughout             Skin:                            Clean and dry, no rashes, no breakdown   Diagnostic Tests:    Transthoracic Echocardiography  Patient: Ronell, Duffus MR #: [id number] Study Date: 03/24/2013 Gender: M Age: 60 Height: 172.7cm Weight: 71.2kg BSA: 1.61m^2 Pt. Status: Room:  ATTENDING  Quay Burow, MD ORDERING Quay Burow, MD REFERRING Quay Burow, MD SONOGRAPHER Marygrace Drought, RCS PERFORMING [facility] cc:  ------------------------------------------------------------  ------------------------------------------------------------ Indications: 414.01 Coronary atherosclerosis - native artery.  ------------------------------------------------------------ History: PMH: PVD Chest pain.  ------------------------------------------------------------ Study Conclusions  - Left ventricle: The cavity size was normal. Wall thickness was increased in a pattern of mild LVH. Systolic function was normal. Wall motion was normal; there were no regional wall motion abnormalities. - Aortic valve: Mild regurgitation. - Mitral valve: Valve area by pressure half-time: 1.79cm^2. - Left atrium: The atrium was mildly dilated. Transthoracic echocardiography. M-mode, complete 2D, spectral Doppler, and color Doppler. Height: Height: 172.7cm. Height: 68in. Weight: Weight: 71.2kg. Weight: 156.7lb. Body mass index: BMI: 23.9kg/m^2. Body surface area: BSA: 1.64m^2. Blood pressure: 168/90. Patient status: Outpatient. Location: Echo laboratory.  ------------------------------------------------------------  ------------------------------------------------------------ Left ventricle: The cavity size was normal. Wall thickness was increased in a pattern of mild LVH. Systolic function was normal. Wall motion was normal; there were no regional wall motion abnormalities. Abnormal relaxation with increased filling pressures.  ------------------------------------------------------------ Aortic valve: Trileaflet; normal thickness leaflets. Mobility was not restricted. Doppler: Transvalvular velocity was within the normal range. There was no stenosis. Mild regurgitation. VTI ratio of LVOT to aortic valve: 0.56. Peak velocity ratio of LVOT to aortic valve: 0.63. Mean gradient: 82mm Hg  (S).  ------------------------------------------------------------ Aorta: Aortic root: The aortic root was normal in  size.  ------------------------------------------------------------ Mitral valve: Structurally normal valve. Mobility was not restricted. Doppler: Transvalvular velocity was within the normal range. There was no evidence for stenosis. No regurgitation. Valve area by pressure half-time: 1.79cm^2. Indexed valve area by pressure half-time: 0.97cm^2/m^2. Mean gradient: 45mm Hg (D).  ------------------------------------------------------------ Left atrium: LA Volume/ BSA = 31.0 ml/m2 The atrium was mildly dilated.  ------------------------------------------------------------ Right ventricle: The cavity size was normal. Wall thickness was normal. Systolic function was normal.  ------------------------------------------------------------ Pulmonic valve: Doppler: Transvalvular velocity was within the normal range. There was no evidence for stenosis.  ------------------------------------------------------------ Tricuspid valve: Structurally normal valve. Doppler: Transvalvular velocity was within the normal range. Trivial regurgitation.  ------------------------------------------------------------ Pulmonary artery: The main pulmonary artery was normal-sized. Systolic pressure was within the normal range.  ------------------------------------------------------------ Right atrium: The atrium was normal in size.  ------------------------------------------------------------ Pericardium: There was no pericardial effusion.  ------------------------------------------------------------ Systemic veins: Inferior vena cava: The vessel was normal in size; the respirophasic diameter changes were in the normal range (= 50%); findings are consistent with normal central venous pressure. Diameter: 66mm.  ------------------------------------------------------------  2D  measurements Normal Doppler measurements Normal IVC Main pulmonary Diam 13 mm ------ artery Left ventricle Pressure, 25 mm Hg =30 LVID ED, 52.6 mm 43-52 S chord, Left ventricle PLAX Ea, lat 4.3 cm/s ------ LVID ES, 30.6 mm 23-38 ann, tiss chord, DP PLAX E/Ea, lat 15.6 ------ FS, chord, 42 % >29 ann, tiss PLAX DP LVPW, ED 10.9 mm ------ Ea, med 4.2 cm/s ------ IVS/LVPW 1.04 <1.3 ann, tiss ratio, ED DP Ventricular septum E/Ea, med 15.9 ------ IVS, ED 11.3 mm ------ ann, tiss 8 Aorta DP Root diam, 26 mm ------ LVOT ED Peak vel, 89.5 cm/s ------ Left atrium S AP dim 39 mm ------ VTI, S 23.6 cm ------ AP dim 2.12 cm/m^2 <2.2 Aortic valve index Peak vel, 143 cm/s ------ S Mean vel, 104 cm/s ------ S VTI, S 42.1 cm ------ Mean 5 mm Hg ------ gradient, S VTI ratio 0.56 ------ LVOT/AV Peak vel 0.63 ------ ratio, LVOT/AV Regurg PHT 491 ms ------ Mitral valve Peak E vel 67.1 cm/s ------ Peak A vel 110 cm/s ------ Mean vel, 74.8 cm/s ------ D Decelerati 394 ms 150-23 on time 0 Pressure 123 ms ------ half-time Mean 3 mm Hg ------ gradient, D Peak E/A 0.6 ------ ratio Area (PHT) 1.79 cm^2 ------ Area index 0.97 cm^2/m ------ (PHT) ^2 Annulus 52.8 cm ------ VTI Tricuspid valve Regurg 226 cm/s ------ peak vel Peak RV-RA 20 mm Hg ------ gradient, S Systemic veins Estimated 5 mm Hg ------ CVP Right ventricle Pressure, 25 mm Hg <30 S Sa vel, 11.6 cm/s ------ lat ann, tiss DP  ------------------------------------------------------------ Prepared and Electronically Authenticated by  Croitoru, Mihai [id number]:[id number]             CARDIAC CATHETERIZATION   PROCEDURE DESCRIPTION:   The patient was brought to the second floor [facility] cath lab in the postabsorptive state. He was premedicated with Valium 5 mg by mouth, IV Versed and fentanyl. His right wristwas prepped and shaved in usual sterile fashion.Xylocaine 1% was used for local  anesthesia. A 5 French sheath was inserted into the right radial artery using standard Seldinger technique. The patient received   3500 units of heparin intravenously. Using a 5 [place] TIG catheter and ultimately a 5 [place] FL3 guide catheter and pigtail catheter selective coronary angiography and left ventriculography were performed. Omnipaque dye was usedfor the entirety of the case.retrograde aortic, left ventricular and pullback pressures were recorded.  HEMODYNAMICS:  AO SYSTOLIC/AO DIASTOLIC: 166/06   LV SYSTOLIC/LV DIASTOLIC: 301/60   ANGIOGRAPHIC RESULTS:  1. Left main; 50% diffuse damping using a 5 [place] catheter   2. LAD; 75% focal proximal fluoroscopic calcification   3. Left circumflex; 75-80% with ostial fluoroscopic calcification.   4. Right coronary artery; dominant with a long 50% segmental mid stenosis   5. Left ventriculography; RAO left ventriculogram was performed using   25 mL of Visipaque dye at 12 mL/second. The overall LVEF estimated   70 % Without wall motion abnormalities   IMPRESSION:Mr. Pla has 3 vessel disease with moderate RCA disease, moderate left main, and significant proximal LAD and circumflex disease normal LV function. He does have progressive symptoms. In going to review his intravenous bicarbonate prior to making a recommendation that I am leaning towards coronary bypass grafting. The radial sheath was removed and a TR Band was placed on the right wrist to achieve patent hemostasis. The patient left the Cath Lab in stable condition. He will be hydrated for 2 hours and discharged home. I will see him back in the office in one to 2 weeks for followup   Lorretta Harp. MD, [facility]   11/05/2013   5:22 PM     Pulmonary Function Tests  10/19/2013                                                                        FVC                 1.85 L  (47% predicted)           FEV1               1.29 L  (44% predicted)           FEF25-75        0.95 L  (37%  predicted)              CT CHEST WITH CONTRAST  TECHNIQUE: Multidetector CT imaging of the chest was performed during intravenous contrast administration.  CONTRAST: 60 ml Isovue 370.  COMPARISON: DG CHEST 2V dated 02/18/2013; CT CHEST W/ CM dated 12/30/2012; CT ANGIO CHEST dated 02/08/2009; CT CHEST-ABD-PELV W/ CM dated 07/20/2011  FINDINGS: There are no enlarged mediastinal, hilar or axillary lymph nodes. Extensive atherosclerosis of the aorta, great vessels and coronary arteries is again noted. The heart size is normal. There is no pericardial effusion.  There are stable postsurgical changes status post right lower lobe resection. Adjacent loculated pleural effusion at the posterior right costophrenic angle is stable. There are stable calcified pleural plaques bilaterally with stable scattered scarring throughout the right lung. There is a 10 mm macro lobulated left upper lobe nodule on image 19, worrisome for malignancy. No other suspicious nodules are identified.  The visualized upper abdomen has a stable appearance with grossly stable low-density hepatic and renal lesions bilaterally. There is no adrenal mass. Chronic T11 compression deformity is unchanged.  IMPRESSION: 1. 10 mm left upper lobe nodule suspicious for malignancy. This could represent a solitary metastasis or a new primary bronchogenic carcinoma. 2. No other evidence of metastatic disease. PET-CT may be helpful for further staging. 3. Stable postsurgical changes in the right hemithorax with  calcified pleural plaques bilaterally. 4. Extensive atherosclerosis. 5. These results will be called to the ordering clinician or representative by the Radiologist Assistant, and communication documented in the PACS Dashboard.   Electronically Signed By: Camie Patience M.D. On: 07/14/2013 12:16    NUCLEAR MEDICINE PET SKULL BASE TO THIGH  TECHNIQUE: 14.8 mCi F-18 FDG was injected intravenously. Full-ring PET  imaging was performed from the skull base to thigh after the radiotracer. CT data was obtained and used for attenuation correction and anatomic localization.  FASTING BLOOD GLUCOSE: Value: 74 mg/dl  COMPARISON: Chest CT on 10/12/2013  FINDINGS: NECK  No hypermetabolic lymph nodes in the neck.  CHEST  No hypermetabolic mediastinal or hilar nodes. 1.4 cm spiculated nodule in the left upper lobe is hypermetabolic, with SUV max of 8.2. No other suspicious pulmonary nodules seen by CT. Postop changes again seen in the right hemithorax.  ABDOMEN/PELVIS  No abnormal hypermetabolic activity within the liver, pancreas, adrenal glands, or spleen. No hypermetabolic lymph nodes in the abdomen or pelvis. Sigmoid diverticulosis incidentally noted.  SKELETON  No focal hypermetabolic activity to suggest skeletal metastasis.  IMPRESSION: 1.4 cm hypermetabolic left upper lobe pulmonary nodule, with SUV max of 8.2. This consistent with primary bronchogenic carcinoma.  No evidence of thoracic nodal or distant metastatic disease.   Electronically Signed By: Earle Gell M.D. On: 10/13/2013 12:21    MRI HEAD WITHOUT AND WITH CONTRAST  TECHNIQUE: Multiplanar, multiecho pulse sequences of the brain and surrounding structures were obtained without and with intravenous contrast.  CONTRAST: 14 cc MultiHance  COMPARISON: Head CT 07/23/2011  FINDINGS: Diffusion imaging does not show any acute or subacute infarction. Brainstem is normal. The cerebellum is normal except for a developmental venous anomaly on the left of no clinical significance. The cerebral hemispheres show mild chronic small-vessel change affecting the deep and subcortical white matter, of a degree frequently seen an individuals of this age. No cortical or large vessel territory infarction. No intra-axial mass lesion, hemorrhage, hydrocephalus or extra-axial collection. No skull or skullbase lesion is  seen.  IMPRESSION: No evidence of metastatic disease. Ordinary age related atrophy and chronic small vessel change. Small developmental venous anomaly of the left cerebellum without evidence of previous hemorrhage, not of any clinical significance.   Electronically Signed By: Nelson Chimes M.D. On: 10/16/2013 13:38     Impression:  The patient has left main disease with severe three-vessel coronary artery disease and normal left ventricular systolic function. He presents with accelerating symptoms of exertional shortness of breath and pain in both arms and upper back that is highly suspicious for angina pectoris. Concurrently the patient has a recently discovered enlarging mass within the left upper lobe that is highly suspicious for primary lung cancer. The mass currently measures 1.4 cm in its greatest diameter which has increased significantly in size from 1.0 cm over the past 6 months. In addition, PET imaging revealed increased metabolic activity in the mass with maxSUV >8.  The patient has a remote history of lung cancer treated surgically in the distant past with right lower lobectomy.  In addition, he has significant COPD with chronic airway obstruction and exertional shortness of breath. The mass in the left upper lobe is deep within the lung parenchyma and could not be treated with limited surgical resection.  Because of his advanced age, numerous comorbid medical problems, previous right lower lobectomy, and significant chronic lung disease, he would not be considered candidate for left upper lobectomy as treatment of his presumed new primary  lung cancer.  However, there is no sign of locally advanced nor distant metastatic disease (T1aN0M0 clinical stage IA) and the patient might be a reasonably good candidate for stereotactic body radiation therapy (SBRT).      Plan:  I have discussed options at length with the patient and his family in the office previously and again today in Short  Stay. I also discussed treatment options for management of the patient's coronary artery disease over the telephone with Dr. Gwenlyn Found. Unfortunately, the patient's coronary anatomy appears relatively unfavorable for percutaneous coronary intervention because of the significant stenosis of the left main coronary artery with catheter damping upon engagement during catheterization.  In addition, there is high-grade ostial stenosis of the left circumflex coronary artery extending into the left main coronary artery. An attempt at PCI and stenting would be associated with fairly high-risk.  Overall the patient appears to be reasonably good candidate for coronary artery bypass grafting, although there is no question that risks will be somewhat increased because of his advanced age and numerous comorbid medical problems.  Furthermore, any significant complications following coronary artery bypass surgery might delay definitive treatment of the patient's presumed lung cancer.  However, given the patient's coronary anatomy and accelerating symptoms of angina pectoris, his coronary artery disease probably needs definitive management first.  It seems unlikely that he would do well with an attempt at medical therapy for treatment of his coronary artery disease to allow further workup and treatment of his lung cancer in the immediate future.   The patient understands and accepts all potential associated risks of surgery including but not limited to risk of death, stroke or other neurologic complication, myocardial infarction, congestive heart failure, respiratory failure, renal failure, bleeding requiring blood transfusion and/or reexploration, aortic dissection or other major vascular complication, arrhythmia, heart block or bradycardia requiring permanent pacemaker, pneumonia, pleural effusion, wound infection, pulmonary embolus or other thromboembolic complication, chronic pain or other delayed complications related to median  sternotomy, or the late recurrence of symptomatic ischemic heart disease and/or congestive heart failure.  The importance of long term risk modification have been emphasized.  All questions answered.   We plan for elective coronary artery bypass grafting on Friday, 11/20/2013.      Valentina Gu. Roxy Manns, MD 11/17/2013 4:18 PM

## 2013-11-17 NOTE — Pre-Procedure Instructions (Signed)
Glenn Reilly  11/17/2013   Your procedure is scheduled on:  11/19/13  Report to [facility] Admitting at 530 AM.  Call this number if you have problems the morning of surgery: [phone]   Remember:   Do not eat food or drink liquids after midnight.   Take these medicines the morning of surgery with A SIP OF WATER: all inhalers,amlodipine,hydrazine,synthroid   Do not wear jewelry, make-up or nail polish.  Do not wear lotions, powders, or perfumes. You may wear deodorant.  Do not shave 48 hours prior to surgery. Men may shave face and neck.  Do not bring valuables to the hospital.  [facility] is not responsible                  for any belongings or valuables.               Contacts, dentures or bridgework may not be worn into surgery.  Leave suitcase in the car. After surgery it may be brought to your room.  For patients admitted to the hospital, discharge time is determined by your                treatment team.               Patients discharged the day of surgery will not be allowed to drive  home.  Name and phone number of your driver: family  Special Instructions: Incentive Spirometry - Practice and bring it with you on the day of surgery.   Please read over the following fact sheets that you were given: Pain Booklet, Coughing and Deep Breathing, Blood Transfusion Information, Open Heart Packet, MRSA Information and Surgical Site Infection Prevention

## 2013-11-17 NOTE — Progress Notes (Signed)
VASCULAR LAB PRELIMINARY  PRELIMINARY  PRELIMINARY  PRELIMINARY  Pre-op Cardiac Surgery  Carotid Findings:  Right - 40% to 59% ICA stenosis. Vertebral artery flow is antegrade. Left - 1% to 39% ICA stenosis, Vertebral artery flow is antegrade  Upper Extremity Right Left  Brachial Pressures 160 Triphasic 154 Triphasic  Radial Waveforms Triphasic Triphasic  Ulnar Waveforms Triphasic Triphasic  Palmar Arch (Allen's Test) Normal Abnormal   Findings:  Doppler waveforms remained normal with both radial and ulnar compressions on the right. Left Doppler waveforms remained normal with radial compression and diminished 50% with ulnar compression.    Lower  Extremity Right Left  Dorsalis Pedis 162 Triphasic 173 Triphasic  Posterior Tibial 178 Triphasic 206 Triphasic  Ankle/Brachial Indices 1.11 1.29    Findings:  ABIs and Doppler waveforms are within normal limits bilaterally at rest.   Glenn Reilly, RVS 11/17/2013, 3:45 PM

## 2013-11-18 ENCOUNTER — Encounter (HOSPITAL_COMMUNITY): Payer: Medicare Other

## 2013-11-18 NOTE — Progress Notes (Signed)
Rx for Mupirocin Ointment called into [facility] in [place], [place] for positive PCR of MRSA and Staph. Pt notified and voiced understanding.

## 2013-11-18 NOTE — Progress Notes (Addendum)
Unable to contact pt. Regarding positive  PCR. Will start mupirocin the morning of surgery.    Contacted patient and Rx for mupirocin called in for pt.

## 2013-11-19 MED ORDER — DEXTROSE 5 % IV SOLN
30.0000 ug/min | INTRAVENOUS | Status: DC
  Filled 2013-11-19: qty 2

## 2013-11-19 MED ORDER — MAGNESIUM SULFATE 50 % IJ SOLN
40.0000 meq | INTRAMUSCULAR | Status: DC
  Filled 2013-11-19: qty 10

## 2013-11-19 MED ORDER — PLASMA-LYTE 148 IV SOLN
INTRAVENOUS | Status: AC
  Administered 2013-11-20: 09:00:00
  Filled 2013-11-19: qty 2.5

## 2013-11-19 MED ORDER — DOPAMINE-DEXTROSE 3.2-5 MG/ML-% IV SOLN
2.0000 ug/kg/min | INTRAVENOUS | Status: DC
  Filled 2013-11-19: qty 250

## 2013-11-19 MED ORDER — EPINEPHRINE HCL 1 MG/ML IJ SOLN
0.5000 ug/min | INTRAVENOUS | Status: DC
  Filled 2013-11-19: qty 4

## 2013-11-19 MED ORDER — SODIUM CHLORIDE 0.9 % IV SOLN
INTRAVENOUS | Status: DC
  Filled 2013-11-19: qty 40

## 2013-11-19 MED ORDER — DEXMEDETOMIDINE HCL IN NACL 400 MCG/100ML IV SOLN
0.1000 ug/kg/h | INTRAVENOUS | Status: DC
  Filled 2013-11-19: qty 100

## 2013-11-19 MED ORDER — DEXTROSE 5 % IV SOLN
750.0000 mg | INTRAVENOUS | Status: DC
  Filled 2013-11-19: qty 750

## 2013-11-19 MED ORDER — POTASSIUM CHLORIDE 2 MEQ/ML IV SOLN
80.0000 meq | INTRAVENOUS | Status: DC
  Filled 2013-11-19: qty 40

## 2013-11-19 MED ORDER — NITROGLYCERIN IN D5W 200-5 MCG/ML-% IV SOLN
2.0000 ug/min | INTRAVENOUS | Status: DC
  Filled 2013-11-19: qty 250

## 2013-11-19 MED ORDER — METOPROLOL TARTRATE 12.5 MG HALF TABLET
12.5000 mg | ORAL_TABLET | Freq: Once | ORAL | Status: AC
  Administered 2013-11-20: 12.5 mg via ORAL
  Filled 2013-11-19: qty 1

## 2013-11-19 MED ORDER — VANCOMYCIN HCL 10 G IV SOLR
1250.0000 mg | INTRAVENOUS | Status: AC
  Administered 2013-11-20: 1250 mg via INTRAVENOUS
  Filled 2013-11-19 (×2): qty 1250

## 2013-11-19 MED ORDER — DEXTROSE 5 % IV SOLN
1.5000 g | INTRAVENOUS | Status: AC
  Administered 2013-11-20: .75 g via INTRAVENOUS
  Administered 2013-11-20: 1.5 g via INTRAVENOUS
  Filled 2013-11-19 (×2): qty 1.5

## 2013-11-19 MED ORDER — SODIUM CHLORIDE 0.9 % IV SOLN
INTRAVENOUS | Status: DC
  Filled 2013-11-19: qty 30

## 2013-11-19 MED ORDER — SODIUM CHLORIDE 0.9 % IV SOLN
INTRAVENOUS | Status: DC
  Filled 2013-11-19: qty 1

## 2013-11-19 MED ORDER — VANCOMYCIN HCL 1000 MG IV SOLR
INTRAVENOUS | Status: AC
  Administered 2013-11-20: 09:00:00
  Filled 2013-11-19: qty 1000

## 2013-11-20 ENCOUNTER — Encounter (HOSPITAL_COMMUNITY): Payer: Medicare Other | Admitting: Certified Registered Nurse Anesthetist

## 2013-11-20 ENCOUNTER — Inpatient Hospital Stay (HOSPITAL_COMMUNITY): Payer: Medicare Other

## 2013-11-20 ENCOUNTER — Encounter (HOSPITAL_COMMUNITY)
Admission: RE | Disposition: A | Discharge status: Skilled Nursing Facility | Payer: Medicare Other | Source: Ambulatory Visit | Attending: Thoracic Surgery (Cardiothoracic Vascular Surgery)

## 2013-11-20 ENCOUNTER — Other Ambulatory Visit: Payer: Self-pay

## 2013-11-20 ENCOUNTER — Encounter (HOSPITAL_COMMUNITY): Payer: Self-pay | Admitting: *Deleted

## 2013-11-20 ENCOUNTER — Inpatient Hospital Stay (HOSPITAL_COMMUNITY): Payer: Medicare Other | Admitting: Certified Registered Nurse Anesthetist

## 2013-11-20 ENCOUNTER — Inpatient Hospital Stay (HOSPITAL_COMMUNITY)
Admission: RE | Admit: 2013-11-20 | Discharge: 2013-11-27 | DRG: 236 | Disposition: A | Discharge status: Skilled Nursing Facility | Payer: Medicare Other | Source: Ambulatory Visit | Attending: Thoracic Surgery (Cardiothoracic Vascular Surgery) | Admitting: Thoracic Surgery (Cardiothoracic Vascular Surgery)

## 2013-11-20 DIAGNOSIS — IMO0002 Reserved for concepts with insufficient information to code with codable children: Secondary | ICD-10-CM | POA: Diagnosis not present

## 2013-11-20 DIAGNOSIS — R911 Solitary pulmonary nodule: Secondary | ICD-10-CM | POA: Diagnosis present

## 2013-11-20 DIAGNOSIS — J180 Bronchopneumonia, unspecified organism: Secondary | ICD-10-CM | POA: Diagnosis not present

## 2013-11-20 DIAGNOSIS — D6859 Other primary thrombophilia: Secondary | ICD-10-CM | POA: Diagnosis not present

## 2013-11-20 DIAGNOSIS — J984 Other disorders of lung: Secondary | ICD-10-CM | POA: Diagnosis not present

## 2013-11-20 DIAGNOSIS — J449 Chronic obstructive pulmonary disease, unspecified: Secondary | ICD-10-CM | POA: Insufficient documentation

## 2013-11-20 DIAGNOSIS — Z87891 Personal history of nicotine dependence: Secondary | ICD-10-CM | POA: Insufficient documentation

## 2013-11-20 DIAGNOSIS — E039 Hypothyroidism, unspecified: Secondary | ICD-10-CM | POA: Insufficient documentation

## 2013-11-20 DIAGNOSIS — Z888 Allergy status to other drugs, medicaments and biological substances status: Secondary | ICD-10-CM | POA: Diagnosis not present

## 2013-11-20 DIAGNOSIS — I2510 Atherosclerotic heart disease of native coronary artery without angina pectoris: Principal | ICD-10-CM

## 2013-11-20 DIAGNOSIS — I69991 Dysphagia following unspecified cerebrovascular disease: Secondary | ICD-10-CM | POA: Diagnosis not present

## 2013-11-20 DIAGNOSIS — Z9089 Acquired absence of other organs: Secondary | ICD-10-CM | POA: Diagnosis not present

## 2013-11-20 DIAGNOSIS — I498 Other specified cardiac arrhythmias: Secondary | ICD-10-CM | POA: Diagnosis not present

## 2013-11-20 DIAGNOSIS — G8918 Other acute postprocedural pain: Secondary | ICD-10-CM | POA: Diagnosis not present

## 2013-11-20 DIAGNOSIS — J9382 Other air leak: Secondary | ICD-10-CM | POA: Diagnosis not present

## 2013-11-20 DIAGNOSIS — R918 Other nonspecific abnormal finding of lung field: Secondary | ICD-10-CM | POA: Diagnosis not present

## 2013-11-20 DIAGNOSIS — Z85118 Personal history of other malignant neoplasm of bronchus and lung: Secondary | ICD-10-CM

## 2013-11-20 DIAGNOSIS — F411 Generalized anxiety disorder: Secondary | ICD-10-CM | POA: Diagnosis present

## 2013-11-20 DIAGNOSIS — Z8673 Personal history of transient ischemic attack (TIA), and cerebral infarction without residual deficits: Secondary | ICD-10-CM | POA: Insufficient documentation

## 2013-11-20 DIAGNOSIS — R1310 Dysphagia, unspecified: Secondary | ICD-10-CM | POA: Diagnosis present

## 2013-11-20 DIAGNOSIS — E782 Mixed hyperlipidemia: Secondary | ICD-10-CM | POA: Diagnosis present

## 2013-11-20 DIAGNOSIS — I739 Peripheral vascular disease, unspecified: Secondary | ICD-10-CM | POA: Diagnosis present

## 2013-11-20 DIAGNOSIS — I10 Essential (primary) hypertension: Secondary | ICD-10-CM | POA: Diagnosis present

## 2013-11-20 DIAGNOSIS — Z510 Encounter for antineoplastic radiation therapy: Secondary | ICD-10-CM | POA: Diagnosis present

## 2013-11-20 DIAGNOSIS — Z800 Family history of malignant neoplasm of digestive organs: Secondary | ICD-10-CM

## 2013-11-20 DIAGNOSIS — Z951 Presence of aortocoronary bypass graft: Secondary | ICD-10-CM | POA: Diagnosis not present

## 2013-11-20 DIAGNOSIS — Z602 Problems related to living alone: Secondary | ICD-10-CM | POA: Diagnosis not present

## 2013-11-20 DIAGNOSIS — J811 Chronic pulmonary edema: Secondary | ICD-10-CM | POA: Diagnosis not present

## 2013-11-20 DIAGNOSIS — R5381 Other malaise: Secondary | ICD-10-CM | POA: Diagnosis not present

## 2013-11-20 DIAGNOSIS — Z801 Family history of malignant neoplasm of trachea, bronchus and lung: Secondary | ICD-10-CM | POA: Diagnosis not present

## 2013-11-20 DIAGNOSIS — Z9889 Other specified postprocedural states: Secondary | ICD-10-CM | POA: Diagnosis not present

## 2013-11-20 DIAGNOSIS — J439 Emphysema, unspecified: Secondary | ICD-10-CM | POA: Diagnosis present

## 2013-11-20 DIAGNOSIS — Z8249 Family history of ischemic heart disease and other diseases of the circulatory system: Secondary | ICD-10-CM

## 2013-11-20 DIAGNOSIS — Z7982 Long term (current) use of aspirin: Secondary | ICD-10-CM | POA: Diagnosis not present

## 2013-11-20 DIAGNOSIS — Z85828 Personal history of other malignant neoplasm of skin: Secondary | ICD-10-CM | POA: Diagnosis not present

## 2013-11-20 DIAGNOSIS — D62 Acute posthemorrhagic anemia: Secondary | ICD-10-CM | POA: Diagnosis not present

## 2013-11-20 DIAGNOSIS — I7789 Other specified disorders of arteries and arterioles: Secondary | ICD-10-CM | POA: Diagnosis not present

## 2013-11-20 DIAGNOSIS — J438 Other emphysema: Secondary | ICD-10-CM | POA: Diagnosis present

## 2013-11-20 DIAGNOSIS — J9819 Other pulmonary collapse: Secondary | ICD-10-CM | POA: Diagnosis not present

## 2013-11-20 DIAGNOSIS — Z79899 Other long term (current) drug therapy: Secondary | ICD-10-CM

## 2013-11-20 DIAGNOSIS — E785 Hyperlipidemia, unspecified: Secondary | ICD-10-CM | POA: Diagnosis present

## 2013-11-20 DIAGNOSIS — C3410 Malignant neoplasm of upper lobe, unspecified bronchus or lung: Secondary | ICD-10-CM | POA: Diagnosis present

## 2013-11-20 DIAGNOSIS — E8779 Other fluid overload: Secondary | ICD-10-CM | POA: Diagnosis present

## 2013-11-20 DIAGNOSIS — R4181 Age-related cognitive decline: Secondary | ICD-10-CM | POA: Diagnosis present

## 2013-11-20 DIAGNOSIS — Z9861 Coronary angioplasty status: Secondary | ICD-10-CM

## 2013-11-20 DIAGNOSIS — Z902 Acquired absence of lung [part of]: Secondary | ICD-10-CM | POA: Diagnosis not present

## 2013-11-20 DIAGNOSIS — J4489 Other specified chronic obstructive pulmonary disease: Secondary | ICD-10-CM | POA: Diagnosis not present

## 2013-11-20 DIAGNOSIS — I359 Nonrheumatic aortic valve disorder, unspecified: Secondary | ICD-10-CM | POA: Diagnosis present

## 2013-11-20 DIAGNOSIS — I69391 Dysphagia following cerebral infarction: Secondary | ICD-10-CM

## 2013-11-20 DIAGNOSIS — C3490 Malignant neoplasm of unspecified part of unspecified bronchus or lung: Secondary | ICD-10-CM | POA: Diagnosis present

## 2013-11-20 DIAGNOSIS — J61 Pneumoconiosis due to asbestos and other mineral fibers: Secondary | ICD-10-CM | POA: Diagnosis not present

## 2013-11-20 DIAGNOSIS — J90 Pleural effusion, not elsewhere classified: Secondary | ICD-10-CM | POA: Diagnosis not present

## 2013-11-20 HISTORY — PX: INTRAOPERATIVE TRANSESOPHAGEAL ECHOCARDIOGRAM: SHX5062

## 2013-11-20 HISTORY — DX: Presence of aortocoronary bypass graft: Z95.1

## 2013-11-20 HISTORY — PX: BIOPSY: SHX5522

## 2013-11-20 HISTORY — PX: CORONARY ARTERY BYPASS GRAFT: SHX141

## 2013-11-20 LAB — POCT I-STAT, CHEM 8
BUN: 24 mg/dL — AB (ref 6–23)
BUN: 23 mg/dL (ref 6–23)
BUN: 23 mg/dL (ref 6–23)
BUN: 22 mg/dL (ref 6–23)
BUN: 19 mg/dL (ref 6–23)
CALCIUM ION: 1.33 mmol/L — AB (ref 1.13–1.30)
CALCIUM ION: 1.36 mmol/L — AB (ref 1.13–1.30)
CALCIUM ION: 1.36 mmol/L — AB (ref 1.13–1.30)
Calcium, Ion: 1.35 mmol/L — ABNORMAL HIGH (ref 1.13–1.30)
Calcium, Ion: 1.2 mmol/L (ref 1.13–1.30)
Chloride: 107 mEq/L (ref 96–112)
Chloride: 106 mEq/L (ref 96–112)
Chloride: 103 mEq/L (ref 96–112)
Chloride: 105 mEq/L (ref 96–112)
Chloride: 120 mEq/L — ABNORMAL HIGH (ref 96–112)
Creatinine, Ser: 1.1 mg/dL (ref 0.50–1.35)
Creatinine, Ser: 1.1 mg/dL (ref 0.50–1.35)
Creatinine, Ser: 1.1 mg/dL (ref 0.50–1.35)
Creatinine, Ser: 1.1 mg/dL (ref 0.50–1.35)
Creatinine, Ser: 1.3 mg/dL (ref 0.50–1.35)
GLUCOSE: 124 mg/dL — AB (ref 70–99)
Glucose, Bld: 95 mg/dL (ref 70–99)
Glucose, Bld: 112 mg/dL — ABNORMAL HIGH (ref 70–99)
Glucose, Bld: 110 mg/dL — ABNORMAL HIGH (ref 70–99)
Glucose, Bld: 115 mg/dL — ABNORMAL HIGH (ref 70–99)
HCT: 33 % — ABNORMAL LOW (ref 39.0–52.0)
HCT: 27 % — ABNORMAL LOW (ref 39.0–52.0)
HEMATOCRIT: 31 % — AB (ref 39.0–52.0)
HEMATOCRIT: 31 % — AB (ref 39.0–52.0)
HEMATOCRIT: 29 % — AB (ref 39.0–52.0)
HEMOGLOBIN: 11.2 g/dL — AB (ref 13.0–17.0)
HEMOGLOBIN: 10.5 g/dL — AB (ref 13.0–17.0)
HEMOGLOBIN: 10.5 g/dL — AB (ref 13.0–17.0)
HEMOGLOBIN: 9.9 g/dL — AB (ref 13.0–17.0)
Hemoglobin: 9.2 g/dL — ABNORMAL LOW (ref 13.0–17.0)
Potassium: 4 mEq/L (ref 3.7–5.3)
Potassium: 4 mEq/L (ref 3.7–5.3)
Potassium: 4 mEq/L (ref 3.7–5.3)
Potassium: 4 mEq/L (ref 3.7–5.3)
Potassium: 4.5 mEq/L (ref 3.7–5.3)
SODIUM: 142 meq/L (ref 137–147)
SODIUM: 141 meq/L (ref 137–147)
SODIUM: 143 meq/L (ref 137–147)
Sodium: 143 mEq/L (ref 137–147)
Sodium: 136 mEq/L — ABNORMAL LOW (ref 137–147)
TCO2: 26 mmol/L (ref 0–100)
TCO2: 28 mmol/L (ref 0–100)
TCO2: 26 mmol/L (ref 0–100)
TCO2: 28 mmol/L (ref 0–100)
TCO2: 23 mmol/L (ref 0–100)

## 2013-11-20 LAB — POCT I-STAT 3, ART BLOOD GAS (G3+)
ACID-BASE EXCESS: 1 mmol/L (ref 0.0–2.0)
Acid-Base Excess: 2 mmol/L (ref 0.0–2.0)
Bicarbonate: 28.4 mEq/L — ABNORMAL HIGH (ref 20.0–24.0)
Bicarbonate: 25.5 mEq/L — ABNORMAL HIGH (ref 20.0–24.0)
O2 SAT: 100 %
O2 Saturation: 100 %
PH ART: 7.328 — AB (ref 7.350–7.450)
PO2 ART: 172 mmHg — AB (ref 80.0–100.0)
Patient temperature: 33.8
TCO2: 30 mmol/L (ref 0–100)
TCO2: 27 mmol/L (ref 0–100)
pCO2 arterial: 54 mmHg — ABNORMAL HIGH (ref 35.0–45.0)
pCO2 arterial: 32.1 mmHg — ABNORMAL LOW (ref 35.0–45.0)
pH, Arterial: 7.496 — ABNORMAL HIGH (ref 7.350–7.450)
pO2, Arterial: 437 mmHg — ABNORMAL HIGH (ref 80.0–100.0)

## 2013-11-20 LAB — CBC
HEMATOCRIT: 30.2 % — AB (ref 39.0–52.0)
HEMATOCRIT: 29.6 % — AB (ref 39.0–52.0)
HEMOGLOBIN: 10 g/dL — AB (ref 13.0–17.0)
HEMOGLOBIN: 9.8 g/dL — AB (ref 13.0–17.0)
MCH: 29.8 pg (ref 26.0–34.0)
MCH: 30.1 pg (ref 26.0–34.0)
MCHC: 33.1 g/dL (ref 30.0–36.0)
MCHC: 33.1 g/dL (ref 30.0–36.0)
MCV: 89.9 fL (ref 78.0–100.0)
MCV: 90.8 fL (ref 78.0–100.0)
Platelets: 120 10*3/uL — ABNORMAL LOW (ref 150–400)
Platelets: 127 10*3/uL — ABNORMAL LOW (ref 150–400)
RBC: 3.36 MIL/uL — ABNORMAL LOW (ref 4.22–5.81)
RBC: 3.26 MIL/uL — AB (ref 4.22–5.81)
RDW: 13.9 % (ref 11.5–15.5)
RDW: 14 % (ref 11.5–15.5)
WBC: 9.8 10*3/uL (ref 4.0–10.5)
WBC: 14.4 10*3/uL — AB (ref 4.0–10.5)

## 2013-11-20 LAB — CREATININE, SERUM
Creatinine, Ser: 1.2 mg/dL (ref 0.50–1.35)
GFR, EST AFRICAN AMERICAN: 63 mL/min — AB (ref >90)
GFR, EST NON AFRICAN AMERICAN: 54 mL/min — AB (ref >90)

## 2013-11-20 LAB — POCT I-STAT 4, (NA,K, GLUC, HGB,HCT)
Glucose, Bld: 108 mg/dL — ABNORMAL HIGH (ref 70–99)
HCT: 29 % — ABNORMAL LOW (ref 39.0–52.0)
Hemoglobin: 9.9 g/dL — ABNORMAL LOW (ref 13.0–17.0)
POTASSIUM: 3.6 meq/L — AB (ref 3.7–5.3)
Sodium: 142 mEq/L (ref 137–147)

## 2013-11-20 LAB — APTT: APTT: 33 s (ref 24–37)

## 2013-11-20 LAB — GLUCOSE, CAPILLARY: Glucose-Capillary: 121 mg/dL — ABNORMAL HIGH (ref 70–99)

## 2013-11-20 LAB — MAGNESIUM: Magnesium: 3.1 mg/dL — ABNORMAL HIGH (ref 1.5–2.5)

## 2013-11-20 LAB — PROTIME-INR
INR: 1.59 — AB (ref 0.00–1.49)
Prothrombin Time: 19 seconds — ABNORMAL HIGH (ref 11.6–15.2)

## 2013-11-20 SURGERY — OFF PUMP CORONARY ARTERY BYPASS GRAFTING (CABG)
Anesthesia: General | Site: Chest

## 2013-11-20 MED ORDER — LACTATED RINGERS IV SOLN
INTRAVENOUS | Status: DC | PRN
  Administered 2013-11-20 (×2): via INTRAVENOUS

## 2013-11-20 MED ORDER — PANTOPRAZOLE SODIUM 40 MG PO TBEC
40.0000 mg | DELAYED_RELEASE_TABLET | Freq: Every day | ORAL | Status: DC
  Administered 2013-11-22 – 2013-11-25 (×4): 40 mg via ORAL
  Filled 2013-11-20 (×4): qty 1

## 2013-11-20 MED ORDER — SODIUM CHLORIDE 0.9 % IJ SOLN
3.0000 mL | Freq: Two times a day (BID) | INTRAMUSCULAR | Status: DC
Start: 2013-11-21 — End: 2013-11-24
  Administered 2013-11-21 – 2013-11-24 (×7): 3 mL via INTRAVENOUS

## 2013-11-20 MED ORDER — PROPOFOL 10 MG/ML IV BOLUS
INTRAVENOUS | Status: DC | PRN
  Administered 2013-11-20: 50 mg via INTRAVENOUS

## 2013-11-20 MED ORDER — MIDAZOLAM HCL 5 MG/5ML IJ SOLN
INTRAMUSCULAR | Status: DC | PRN
  Administered 2013-11-20: 1 mg via INTRAVENOUS
  Administered 2013-11-20: 5 mg via INTRAVENOUS
  Administered 2013-11-20: 1 mg via INTRAVENOUS
  Administered 2013-11-20: 3 mg via INTRAVENOUS

## 2013-11-20 MED ORDER — FENTANYL CITRATE 0.05 MG/ML IJ SOLN
INTRAMUSCULAR | Status: AC
  Filled 2013-11-20: qty 5

## 2013-11-20 MED ORDER — SODIUM CHLORIDE 0.9 % IJ SOLN: 3.0000 mL | INTRAMUSCULAR | Status: DC | PRN

## 2013-11-20 MED ORDER — LACTATED RINGERS IV SOLN
INTRAVENOUS | Status: DC | PRN
  Administered 2013-11-20: 07:00:00 via INTRAVENOUS

## 2013-11-20 MED ORDER — HEPARIN SODIUM (PORCINE) 1000 UNIT/ML IJ SOLN
INTRAMUSCULAR | Status: AC
  Filled 2013-11-20: qty 1

## 2013-11-20 MED ORDER — HYDROCORTISONE NA SUCCINATE PF 100 MG IJ SOLR
50.0000 mg | Freq: Three times a day (TID) | INTRAMUSCULAR | Status: AC
  Administered 2013-11-20 – 2013-11-21 (×2): 50 mg via INTRAVENOUS
  Filled 2013-11-20 (×4): qty 1

## 2013-11-20 MED ORDER — INSULIN REGULAR BOLUS VIA INFUSION
0.0000 [IU] | Freq: Three times a day (TID) | INTRAVENOUS | Status: DC
  Filled 2013-11-20: qty 10

## 2013-11-20 MED ORDER — METOPROLOL TARTRATE 25 MG/10 ML ORAL SUSPENSION
12.5000 mg | Freq: Two times a day (BID) | ORAL | Status: DC
  Filled 2013-11-20 (×15): qty 5

## 2013-11-20 MED ORDER — VECURONIUM BROMIDE 10 MG IV SOLR
INTRAVENOUS | Status: DC | PRN
  Administered 2013-11-20: 2 mg via INTRAVENOUS

## 2013-11-20 MED ORDER — MAGNESIUM SULFATE 4000MG/100ML IJ SOLN
4.0000 g | Freq: Once | INTRAMUSCULAR | Status: AC
  Administered 2013-11-20: 4 g via INTRAVENOUS

## 2013-11-20 MED ORDER — MORPHINE SULFATE 2 MG/ML IJ SOLN
2.0000 mg | INTRAMUSCULAR | Status: DC | PRN
  Administered 2013-11-21 (×2): 2 mg via INTRAVENOUS
  Administered 2013-11-21: 4 mg via INTRAVENOUS
  Administered 2013-11-21 – 2013-11-22 (×7): 2 mg via INTRAVENOUS
  Filled 2013-11-20 (×6): qty 1
  Filled 2013-11-20: qty 2
  Filled 2013-11-20 (×3): qty 1

## 2013-11-20 MED ORDER — ACETAMINOPHEN 160 MG/5ML PO SOLN
1000.0000 mg | Freq: Four times a day (QID) | ORAL | Status: AC
  Administered 2013-11-21 (×2): 1000 mg
  Filled 2013-11-20 (×2): qty 40.6

## 2013-11-20 MED ORDER — HYDROCORTISONE NA SUCCINATE PF 100 MG IJ SOLR
INTRAMUSCULAR | Status: AC
  Filled 2013-11-20: qty 2

## 2013-11-20 MED ORDER — MAGNESIUM SULFATE 40 MG/ML IJ SOLN
INTRAMUSCULAR | Status: AC
  Filled 2013-11-20: qty 100

## 2013-11-20 MED ORDER — DOCUSATE SODIUM 100 MG PO CAPS
200.0000 mg | ORAL_CAPSULE | Freq: Every day | ORAL | Status: DC
  Administered 2013-11-21 – 2013-11-27 (×7): 200 mg via ORAL
  Filled 2013-11-20 (×7): qty 2

## 2013-11-20 MED ORDER — FENTANYL CITRATE 0.05 MG/ML IJ SOLN
INTRAMUSCULAR | Status: DC | PRN
  Administered 2013-11-20: 400 ug via INTRAVENOUS
  Administered 2013-11-20 (×2): 250 ug via INTRAVENOUS
  Administered 2013-11-20: 50 ug via INTRAVENOUS
  Administered 2013-11-20 (×3): 250 ug via INTRAVENOUS
  Administered 2013-11-20: 50 ug via INTRAVENOUS

## 2013-11-20 MED ORDER — ALBUTEROL SULFATE (2.5 MG/3ML) 0.083% IN NEBU
2.5000 mg | INHALATION_SOLUTION | Freq: Four times a day (QID) | RESPIRATORY_TRACT | Status: DC
  Administered 2013-11-20 – 2013-11-21 (×4): 2.5 mg via RESPIRATORY_TRACT
  Filled 2013-11-20 (×4): qty 3

## 2013-11-20 MED ORDER — SODIUM CHLORIDE 0.9 % IV SOLN
INTRAVENOUS | Status: DC
  Administered 2013-11-22: via INTRAVENOUS

## 2013-11-20 MED ORDER — IPRATROPIUM BROMIDE 0.02 % IN SOLN
0.5000 mg | Freq: Four times a day (QID) | RESPIRATORY_TRACT | Status: DC
  Administered 2013-11-20 – 2013-11-21 (×4): 0.5 mg via RESPIRATORY_TRACT
  Filled 2013-11-20 (×4): qty 2.5

## 2013-11-20 MED ORDER — ACETAMINOPHEN 160 MG/5ML PO SOLN: 650.0000 mg | Freq: Once | ORAL | Status: AC

## 2013-11-20 MED ORDER — LEVOTHYROXINE SODIUM 88 MCG PO TABS
88.0000 ug | ORAL_TABLET | Freq: Every day | ORAL | Status: DC
  Administered 2013-11-21 – 2013-11-27 (×7): 88 ug via ORAL
  Filled 2013-11-20 (×9): qty 1

## 2013-11-20 MED ORDER — ASPIRIN 81 MG PO CHEW: 324.0000 mg | CHEWABLE_TABLET | Freq: Every day | ORAL | Status: DC

## 2013-11-20 MED ORDER — NITROGLYCERIN IN D5W 200-5 MCG/ML-% IV SOLN: 0.0000 ug/min | INTRAVENOUS | Status: DC

## 2013-11-20 MED ORDER — DEXTROSE 5 % IV SOLN
1.5000 g | Freq: Two times a day (BID) | INTRAVENOUS | Status: AC
  Administered 2013-11-20 – 2013-11-22 (×4): 1.5 g via INTRAVENOUS
  Filled 2013-11-20 (×4): qty 1.5

## 2013-11-20 MED ORDER — LACTATED RINGERS IV SOLN: INTRAVENOUS | Status: DC

## 2013-11-20 MED ORDER — POTASSIUM CHLORIDE 10 MEQ/50ML IV SOLN
10.0000 meq | INTRAVENOUS | Status: AC
  Administered 2013-11-20 (×3): 10 meq via INTRAVENOUS

## 2013-11-20 MED ORDER — BISACODYL 10 MG RE SUPP: 10.0000 mg | Freq: Every day | RECTAL | Status: DC

## 2013-11-20 MED ORDER — MIDAZOLAM HCL 2 MG/2ML IJ SOLN: 2.0000 mg | INTRAMUSCULAR | Status: DC | PRN

## 2013-11-20 MED ORDER — ACETAMINOPHEN 500 MG PO TABS
1000.0000 mg | ORAL_TABLET | Freq: Four times a day (QID) | ORAL | Status: AC
  Administered 2013-11-21 – 2013-11-25 (×14): 1000 mg via ORAL
  Filled 2013-11-20 (×18): qty 2

## 2013-11-20 MED ORDER — VANCOMYCIN HCL IN DEXTROSE 1-5 GM/200ML-% IV SOLN
1000.0000 mg | Freq: Once | INTRAVENOUS | Status: AC
  Administered 2013-11-20: 1000 mg via INTRAVENOUS
  Filled 2013-11-20: qty 200

## 2013-11-20 MED ORDER — SODIUM CHLORIDE 0.45 % IV SOLN
INTRAVENOUS | Status: DC
  Administered 2013-11-20: 20 mL/h via INTRAVENOUS

## 2013-11-20 MED ORDER — VANCOMYCIN HCL 1000 MG IV SOLR: INTRAVENOUS | Status: DC | PRN

## 2013-11-20 MED ORDER — SODIUM CHLORIDE 0.9 % IV SOLN
250.0000 mL | INTRAVENOUS | Status: AC
  Administered 2013-11-20: 1000 mL via INTRAVENOUS

## 2013-11-20 MED ORDER — HYDROCORTISONE NA SUCCINATE PF 1000 MG IJ SOLR
INTRAMUSCULAR | Status: DC | PRN
  Administered 2013-11-20: 100 mg via INTRAVENOUS

## 2013-11-20 MED ORDER — ASPIRIN EC 325 MG PO TBEC
325.0000 mg | DELAYED_RELEASE_TABLET | Freq: Every day | ORAL | Status: DC
  Administered 2013-11-21 – 2013-11-27 (×7): 325 mg via ORAL
  Filled 2013-11-20 (×7): qty 1

## 2013-11-20 MED ORDER — INSULIN ASPART 100 UNIT/ML ~~LOC~~ SOLN
0.0000 [IU] | SUBCUTANEOUS | Status: DC
  Administered 2013-11-20 – 2013-11-21 (×3): 2 [IU] via SUBCUTANEOUS

## 2013-11-20 MED ORDER — SODIUM CHLORIDE 0.9 % IV SOLN
100.0000 [IU] | INTRAVENOUS | Status: DC | PRN
  Administered 2013-11-20: 1 [IU]/h via INTRAVENOUS

## 2013-11-20 MED ORDER — LACTATED RINGERS IV SOLN: 500.0000 mL | Freq: Once | INTRAVENOUS | Status: AC | PRN

## 2013-11-20 MED ORDER — HEPARIN SODIUM (PORCINE) 1000 UNIT/ML IJ SOLN
INTRAMUSCULAR | Status: DC | PRN
  Administered 2013-11-20: 8 mL via INTRAVENOUS

## 2013-11-20 MED ORDER — DEXMEDETOMIDINE HCL 200 MCG/2ML IV SOLN
200.0000 ug | INTRAVENOUS | Status: DC | PRN
  Administered 2013-11-20: 0.2 ug/kg/h via INTRAVENOUS

## 2013-11-20 MED ORDER — DEXMEDETOMIDINE HCL IN NACL 200 MCG/50ML IV SOLN
0.1000 ug/kg/h | INTRAVENOUS | Status: DC
  Administered 2013-11-21: 0.3 ug/kg/h via INTRAVENOUS
  Filled 2013-11-20: qty 50

## 2013-11-20 MED ORDER — PHENYLEPHRINE HCL 10 MG/ML IJ SOLN
10.0000 mg | INTRAVENOUS | Status: DC | PRN
  Administered 2013-11-20: 20 ug/min via INTRAVENOUS

## 2013-11-20 MED ORDER — METOPROLOL TARTRATE 1 MG/ML IV SOLN: 2.5000 mg | INTRAVENOUS | Status: DC | PRN

## 2013-11-20 MED ORDER — SODIUM CHLORIDE 0.9 % IV SOLN
10.0000 g | INTRAVENOUS | Status: DC | PRN
  Administered 2013-11-20: 5 g/h via INTRAVENOUS

## 2013-11-20 MED ORDER — DEXTROSE 5 % IV SOLN
0.0000 ug/min | INTRAVENOUS | Status: DC
  Administered 2013-11-20: 20 ug/min via INTRAVENOUS
  Administered 2013-11-21: 30 ug/min via INTRAVENOUS
  Filled 2013-11-20 (×2): qty 2

## 2013-11-20 MED ORDER — NITROGLYCERIN IN D5W 200-5 MCG/ML-% IV SOLN
INTRAVENOUS | Status: DC | PRN
  Administered 2013-11-20: 5 ug/min via INTRAVENOUS

## 2013-11-20 MED ORDER — PROPOFOL 10 MG/ML IV BOLUS
INTRAVENOUS | Status: AC
  Filled 2013-11-20: qty 20

## 2013-11-20 MED ORDER — ROCURONIUM BROMIDE 100 MG/10ML IV SOLN
INTRAVENOUS | Status: DC | PRN
  Administered 2013-11-20: 100 mg via INTRAVENOUS

## 2013-11-20 MED ORDER — MORPHINE SULFATE 2 MG/ML IJ SOLN
1.0000 mg | INTRAMUSCULAR | Status: AC | PRN
  Administered 2013-11-20: 2 mg via INTRAVENOUS
  Filled 2013-11-20 (×2): qty 1

## 2013-11-20 MED ORDER — MIDAZOLAM HCL 10 MG/2ML IJ SOLN
INTRAMUSCULAR | Status: AC
  Filled 2013-11-20: qty 2

## 2013-11-20 MED ORDER — PREDNISONE 10 MG PO TABS
10.0000 mg | ORAL_TABLET | Freq: Every day | ORAL | Status: DC
  Administered 2013-11-22 – 2013-11-27 (×6): 10 mg via ORAL
  Filled 2013-11-20 (×8): qty 1

## 2013-11-20 MED ORDER — LACTATED RINGERS IV SOLN
INTRAVENOUS | Status: DC | PRN
  Administered 2013-11-20 (×2): via INTRAVENOUS

## 2013-11-20 MED ORDER — EPHEDRINE SULFATE 50 MG/ML IJ SOLN
INTRAMUSCULAR | Status: DC | PRN
  Administered 2013-11-20 (×3): 10 mg via INTRAVENOUS

## 2013-11-20 MED ORDER — PHENYLEPHRINE HCL 10 MG/ML IJ SOLN
INTRAMUSCULAR | Status: DC | PRN
  Administered 2013-11-20: 40 ug via INTRAVENOUS
  Administered 2013-11-20: 80 ug via INTRAVENOUS

## 2013-11-20 MED ORDER — METOPROLOL TARTRATE 12.5 MG HALF TABLET
12.5000 mg | ORAL_TABLET | Freq: Two times a day (BID) | ORAL | Status: DC
  Administered 2013-11-23 – 2013-11-27 (×8): 12.5 mg via ORAL
  Filled 2013-11-20 (×16): qty 1

## 2013-11-20 MED ORDER — STERILE WATER FOR INJECTION IJ SOLN
INTRAMUSCULAR | Status: AC
Start: 2013-11-20 — End: 2013-11-20
  Filled 2013-11-20: qty 20

## 2013-11-20 MED ORDER — 0.9 % SODIUM CHLORIDE (POUR BTL) OPTIME
TOPICAL | Status: DC | PRN
  Administered 2013-11-20: 1000 mL

## 2013-11-20 MED ORDER — ALBUMIN HUMAN 5 % IV SOLN
250.0000 mL | INTRAVENOUS | Status: AC | PRN
  Administered 2013-11-20 (×4): 250 mL via INTRAVENOUS
  Filled 2013-11-20 (×2): qty 250

## 2013-11-20 MED ORDER — ALBUMIN HUMAN 5 % IV SOLN
INTRAVENOUS | Status: DC | PRN
  Administered 2013-11-20: 11:00:00 via INTRAVENOUS

## 2013-11-20 MED ORDER — ROCURONIUM BROMIDE 50 MG/5ML IV SOLN
INTRAVENOUS | Status: AC
  Filled 2013-11-20: qty 2

## 2013-11-20 MED ORDER — MIDAZOLAM HCL 5 MG/ML IJ SOLN: INTRAMUSCULAR | Status: DC | PRN

## 2013-11-20 MED ORDER — ONDANSETRON HCL 4 MG/2ML IJ SOLN
4.0000 mg | Freq: Four times a day (QID) | INTRAMUSCULAR | Status: DC | PRN
  Administered 2013-11-21: 4 mg via INTRAVENOUS
  Filled 2013-11-20: qty 2

## 2013-11-20 MED ORDER — PROTAMINE SULFATE 10 MG/ML IV SOLN
INTRAVENOUS | Status: DC | PRN
  Administered 2013-11-20 (×2): 30 mg via INTRAVENOUS
  Administered 2013-11-20: 40 mg via INTRAVENOUS
  Administered 2013-11-20: 30 mg via INTRAVENOUS

## 2013-11-20 MED ORDER — OXYCODONE HCL 5 MG PO TABS
5.0000 mg | ORAL_TABLET | ORAL | Status: DC | PRN
  Administered 2013-11-21 – 2013-11-22 (×3): 5 mg via ORAL
  Filled 2013-11-20 (×3): qty 1

## 2013-11-20 MED ORDER — ACETAMINOPHEN 650 MG RE SUPP
650.0000 mg | Freq: Once | RECTAL | Status: AC
  Administered 2013-11-20: 650 mg via RECTAL

## 2013-11-20 MED ORDER — FAMOTIDINE IN NACL 20-0.9 MG/50ML-% IV SOLN
20.0000 mg | Freq: Two times a day (BID) | INTRAVENOUS | Status: AC
  Administered 2013-11-20 – 2013-11-21 (×2): 20 mg via INTRAVENOUS
  Filled 2013-11-20 (×2): qty 50

## 2013-11-20 MED ORDER — SODIUM CHLORIDE 0.9 % IV SOLN
INTRAVENOUS | Status: DC
  Filled 2013-11-20: qty 1

## 2013-11-20 MED ORDER — BISACODYL 5 MG PO TBEC
10.0000 mg | DELAYED_RELEASE_TABLET | Freq: Every day | ORAL | Status: DC
  Administered 2013-11-21 – 2013-11-24 (×4): 10 mg via ORAL
  Filled 2013-11-20 (×4): qty 2

## 2013-11-20 MED FILL — Sodium Chloride IV Soln 0.9%: INTRAVENOUS | Qty: 3000 | Status: AC

## 2013-11-20 SURGICAL SUPPLY — 130 items
ACROBAT AUV VACUUM OFF PUMP SYSTEM ×4 IMPLANT
ADAPTER CARDIO PERF ANTE/RETRO (ADAPTER) IMPLANT
APPLIER CLIP 9.375 MED OPEN (MISCELLANEOUS)
APPLIER CLIP 9.375 SM OPEN (CLIP)
ATTRACTOMAT 16X20 MAGNETIC DRP (DRAPES) ×4 IMPLANT
BAG DECANTER FOR FLEXI CONT (MISCELLANEOUS) ×4 IMPLANT
BANDAGE ELASTIC 4 VELCRO ST LF (GAUZE/BANDAGES/DRESSINGS) ×4 IMPLANT
BANDAGE ELASTIC 6 VELCRO ST LF (GAUZE/BANDAGES/DRESSINGS) ×4 IMPLANT
BANDAGE GAUZE ELAST BULKY 4 IN (GAUZE/BANDAGES/DRESSINGS) ×4 IMPLANT
BASKET HEART (ORDER IN 25'S) (MISCELLANEOUS) ×1
BASKET HEART (ORDER IN 25S) (MISCELLANEOUS) ×3 IMPLANT
BENZOIN TINCTURE PRP APPL 2/3 (GAUZE/BANDAGES/DRESSINGS) ×4 IMPLANT
BLADE STERNUM SYSTEM 6 (BLADE) ×4 IMPLANT
BLADE SURG 11 STRL SS (BLADE) ×4 IMPLANT
BLADE SURG ROTATE 9660 (MISCELLANEOUS) IMPLANT
BLOWER MISTER CAL-MED (MISCELLANEOUS) ×4 IMPLANT
BNDG GAUZE ELAST 4 BULKY (GAUZE/BANDAGES/DRESSINGS) ×4 IMPLANT
CANISTER SUCTION 2500CC (MISCELLANEOUS) ×4 IMPLANT
CANNULA EZ GLIDE AORTIC 21FR (CANNULA) ×4 IMPLANT
CANNULA GUNDRY RCSP 15FR (MISCELLANEOUS) IMPLANT
CANNULA VENOUS LOW PROF 34X46 (CANNULA) ×4 IMPLANT
CARDIAC SUCTION (MISCELLANEOUS) ×4 IMPLANT
CATH CPB KIT OWEN (MISCELLANEOUS) ×4 IMPLANT
CATH THORACIC 28FR (CATHETERS) IMPLANT
CATH THORACIC 28FR RT ANG (CATHETERS) IMPLANT
CATH THORACIC 36FR (CATHETERS) ×4 IMPLANT
CATH THORACIC 36FR RT ANG (CATHETERS) ×4 IMPLANT
CLIP APPLIE 9.375 MED OPEN (MISCELLANEOUS) IMPLANT
CLIP APPLIE 9.375 SM OPEN (CLIP) IMPLANT
CLIP FOGARTY SPRING 6M (CLIP) IMPLANT
CLIP RETRACTION 3.0MM CORONARY (MISCELLANEOUS) ×4 IMPLANT
CLIP TI MEDIUM 24 (CLIP) IMPLANT
CLIP TI WIDE RED SMALL 24 (CLIP) IMPLANT
CONN Y 3/8X3/8X3/8  BEN (MISCELLANEOUS)
CONN Y 3/8X3/8X3/8 BEN (MISCELLANEOUS) IMPLANT
COVER SURGICAL LIGHT HANDLE (MISCELLANEOUS) ×4 IMPLANT
CRADLE DONUT ADULT HEAD (MISCELLANEOUS) ×4 IMPLANT
DRAIN CHANNEL 32F RND 10.7 FF (WOUND CARE) ×4 IMPLANT
DRAPE CARDIOVASCULAR INCISE (DRAPES) ×1
DRAPE INCISE IOBAN 66X45 STRL (DRAPES) ×4 IMPLANT
DRAPE SLUSH/WARMER DISC (DRAPES) ×4 IMPLANT
DRAPE SRG 135X102X78XABS (DRAPES) ×3 IMPLANT
DRSG COVADERM 4X14 (GAUZE/BANDAGES/DRESSINGS) ×4 IMPLANT
ELECT REM PT RETURN 9FT ADLT (ELECTROSURGICAL) ×8
ELECTRODE REM PT RTRN 9FT ADLT (ELECTROSURGICAL) ×6 IMPLANT
GLOVE BIO SURGEON STRL SZ 6 (GLOVE) ×4 IMPLANT
GLOVE BIO SURGEON STRL SZ 6.5 (GLOVE) ×32 IMPLANT
GLOVE BIO SURGEON STRL SZ7 (GLOVE) IMPLANT
GLOVE BIO SURGEON STRL SZ7.5 (GLOVE) IMPLANT
GLOVE BIO SURGEON STRL SZ8 (GLOVE) ×8 IMPLANT
GLOVE BIOGEL PI IND STRL 6 (GLOVE) IMPLANT
GLOVE BIOGEL PI IND STRL 6.5 (GLOVE) IMPLANT
GLOVE BIOGEL PI IND STRL 7.0 (GLOVE) IMPLANT
GLOVE BIOGEL PI IND STRL 7.5 (GLOVE) ×3 IMPLANT
GLOVE BIOGEL PI INDICATOR 6 (GLOVE)
GLOVE BIOGEL PI INDICATOR 6.5 (GLOVE)
GLOVE BIOGEL PI INDICATOR 7.0 (GLOVE)
GLOVE BIOGEL PI INDICATOR 7.5 (GLOVE) ×1
GLOVE EUDERMIC 7 POWDERFREE (GLOVE) IMPLANT
GLOVE ORTHO TXT STRL SZ7.5 (GLOVE) ×8 IMPLANT
GOWN STRL REUS W/ TWL LRG LVL3 (GOWN DISPOSABLE) ×36 IMPLANT
GOWN STRL REUS W/TWL LRG LVL3 (GOWN DISPOSABLE) ×12
HEMOSTAT POWDER SURGIFOAM 1G (HEMOSTASIS) ×12 IMPLANT
INSERT FOGARTY 61MM (MISCELLANEOUS) IMPLANT
INSERT FOGARTY XLG (MISCELLANEOUS) ×4 IMPLANT
KIT BASIN OR (CUSTOM PROCEDURE TRAY) ×4 IMPLANT
KIT ROOM TURNOVER OR (KITS) ×4 IMPLANT
KIT SUCTION CATH 14FR (SUCTIONS) ×20 IMPLANT
KIT VASOVIEW W/TROCAR VH 2000 (KITS) ×4 IMPLANT
LEAD PACING MYOCARDI (MISCELLANEOUS) ×4 IMPLANT
LINE EXTENSION DELIVERY (MISCELLANEOUS) ×4 IMPLANT
MARKER GRAFT CORONARY BYPASS (MISCELLANEOUS) ×12 IMPLANT
NEEDLE BIOPSY 14X6 SOFT TISS (NEEDLE) ×4 IMPLANT
NS IRRIG 1000ML POUR BTL (IV SOLUTION) ×20 IMPLANT
PACK OPEN HEART (CUSTOM PROCEDURE TRAY) ×4 IMPLANT
PAD ARMBOARD 7.5X6 YLW CONV (MISCELLANEOUS) ×4 IMPLANT
PAD ELECT DEFIB RADIOL ZOLL (MISCELLANEOUS) ×4 IMPLANT
PENCIL BUTTON HOLSTER BLD 10FT (ELECTRODE) ×4 IMPLANT
PUNCH AORTIC ROTATE 4.0MM (MISCELLANEOUS) IMPLANT
PUNCH AORTIC ROTATE 4.5MM 8IN (MISCELLANEOUS) IMPLANT
PUNCH AORTIC ROTATE 5MM 8IN (MISCELLANEOUS) IMPLANT
SOLUTION ANTI FOG 6CC (MISCELLANEOUS) ×4 IMPLANT
SPONGE GAUZE 4X4 12PLY (GAUZE/BANDAGES/DRESSINGS) ×8 IMPLANT
SPONGE LAP 18X18 X RAY DECT (DISPOSABLE) IMPLANT
SPONGE LAP 4X18 X RAY DECT (DISPOSABLE) IMPLANT
SUT BONE WAX W31G (SUTURE) ×4 IMPLANT
SUT ETHIBOND X763 2 0 SH 1 (SUTURE) ×8 IMPLANT
SUT MNCRL AB 3-0 PS2 18 (SUTURE) ×8 IMPLANT
SUT MNCRL AB 4-0 PS2 18 (SUTURE) ×4 IMPLANT
SUT PDS AB 1 CTX 36 (SUTURE) ×8 IMPLANT
SUT PROLENE 2 0 SH DA (SUTURE) IMPLANT
SUT PROLENE 3 0 SH DA (SUTURE) ×4 IMPLANT
SUT PROLENE 3 0 SH1 36 (SUTURE) IMPLANT
SUT PROLENE 4 0 RB 1 (SUTURE)
SUT PROLENE 4 0 SH DA (SUTURE) IMPLANT
SUT PROLENE 4-0 RB1 .5 CRCL 36 (SUTURE) IMPLANT
SUT PROLENE 5 0 C 1 36 (SUTURE) IMPLANT
SUT PROLENE 6 0 C 1 30 (SUTURE) ×16 IMPLANT
SUT PROLENE 7.0 RB 3 (SUTURE) ×20 IMPLANT
SUT PROLENE 8 0 BV175 6 (SUTURE) IMPLANT
SUT PROLENE BLUE 7 0 (SUTURE) ×4 IMPLANT
SUT PROLENE POLY MONO (SUTURE) IMPLANT
SUT SILK  1 MH (SUTURE) ×1
SUT SILK 1 MH (SUTURE) ×3 IMPLANT
SUT STEEL 6MS V (SUTURE) ×4 IMPLANT
SUT STEEL STERNAL CCS#1 18IN (SUTURE) IMPLANT
SUT STEEL SZ 6 DBL 3X14 BALL (SUTURE) ×8 IMPLANT
SUT VIC AB 1 CTX 36 (SUTURE)
SUT VIC AB 1 CTX36XBRD ANBCTR (SUTURE) IMPLANT
SUT VIC AB 2-0 CT1 27 (SUTURE) ×1
SUT VIC AB 2-0 CT1 TAPERPNT 27 (SUTURE) ×3 IMPLANT
SUT VIC AB 2-0 CTX 27 (SUTURE) IMPLANT
SUT VIC AB 3-0 SH 27 (SUTURE)
SUT VIC AB 3-0 SH 27X BRD (SUTURE) IMPLANT
SUT VIC AB 3-0 X1 27 (SUTURE) IMPLANT
SUT VICRYL 4-0 PS2 18IN ABS (SUTURE) IMPLANT
SUTURE E-PAK OPEN HEART (SUTURE) ×4 IMPLANT
SYSTEM HEARTSTRING SEAL 3.8 (VASCULAR PRODUCTS) ×3 IMPLANT
SYSTEM HEARTSTRING SEAL 3.8MM (VASCULAR PRODUCTS) ×1
SYSTEM SAHARA CHEST DRAIN ATS (WOUND CARE) ×4 IMPLANT
TABLE PACK (MISCELLANEOUS) ×4 IMPLANT
TAPES RETRACTO (MISCELLANEOUS) ×4 IMPLANT
TOWEL OR 17X24 6PK STRL BLUE (TOWEL DISPOSABLE) ×8 IMPLANT
TOWEL OR 17X26 10 PK STRL BLUE (TOWEL DISPOSABLE) ×8 IMPLANT
TRAY FOLEY IC TEMP SENS 16FR (CATHETERS) ×4 IMPLANT
TUBING INSUFFLATION 10FT LAP (TUBING) ×4 IMPLANT
UNDERPAD 30X30 INCONTINENT (UNDERPADS AND DIAPERS) ×4 IMPLANT
WATER STERILE IRR 1000ML POUR (IV SOLUTION) ×8 IMPLANT
XPOSE 4 ACCESS DEVICE ×4 IMPLANT
YANKAUER SUCT BULB TIP NO VENT (SUCTIONS) ×4 IMPLANT

## 2013-11-20 NOTE — Care Management Note (Addendum)
    Page 1 of 1   11/26/2013     4:29:34 PM CARE MANAGEMENT NOTE 11/26/2013  Patient:  EDY, BELT   Account Number:  [id number]  Date Initiated:  11/20/2013  Documentation initiated by:  Elissa Hefty  Subjective/Objective Assessment:   adm post cabg     Action/Plan:   lives alone, pcp dr Vassie Loll eyk   Anticipated DC Date:  11/27/2013   Anticipated DC Plan:  [place]  In-house referral  Clinical Social Worker      DC Planning Services  CM consult      Choice offered to / List presented to:             Status of service:  In process, will continue to follow Medicare Important Message given?  YES (If response is "NO", the following Medicare IM given date fields will be blank) Date Medicare IM given:  11/23/2013 Medicare IM given by:  [facility] Date Additional Medicare IM given:  11/26/2013 Additional Medicare IM given by:  Violett Hobbs  Discharge Disposition:    Per UR Regulation:  Reviewed for med. necessity/level of care/duration of stay  If discussed at [place] of Stay Meetings, dates discussed:    Comments:  11/26/13 Ellan Lambert, RN, BSN [phone] CIR has declined admission; CSW cont to follow to facilitate dc to SNF when medically stable.  Bed available, per CSW notes.  Will follow progress.

## 2013-11-20 NOTE — Op Note (Signed)
CARDIOTHORACIC SURGERY OPERATIVE NOTE  Date of Procedure:  11/20/2013  Preoperative Diagnosis:   Severe Left Main Coronary Artery Disease  Left Lung Nodule  Postoperative Diagnosis: Same  Procedure:     Off-pump Coronary Artery Bypass Grafting x 2   Left Internal Mammary Artery to Distal Left Anterior Descending Coronary Artery  Saphenous Vein Graft to First Obtuse Marginal Branch of Left Circumflex Coronary Artery  Endoscopic Vein Harvest from Right Thigh    Tru-cut Core Needle Biopsy of Left Upper Lobe Lung Nodule   Surgeon: Valentina Gu. Roxy Manns, MD  Assistant: Ellwood Handler, PA-C  Anesthesia: Arabella Merles, MD  Operative Findings: Severely calcified, essentially porcelain ascending thoracic aorta  Normal LV systolic function  Mild aortic insufficiency  Good quality LIMA conduit for grafting  Good quality SVG conduit for grafting  Good quality target vessels for grafting    BRIEF CLINICAL NOTE AND INDICATIONS FOR SURGERY  Patient is an 80 year old widowed white male from [place] with known history of coronary artery disease, hypertension, hyperlipidemia, cerebrovascular disease with previous stroke, PVD, COPD, remote history of tobacco abuse, remote history of lung cancer treated with right lower lobectomy, and recently discovered enlarging mass in the left upper lobe worrisome for new primary lung cancer who has been referred for possible surgical treatment of coronary artery disease. The patient's history of coronary artery disease dates back to 1999 when he first presented with symptoms of angina pectoris. He was treated with PCI and stenting of the right coronary artery by Dr. Olevia Perches. He was followed for years by Dr. Olevia Perches and has done well from a cardiac standpoint until recently. In October of 2014 the patient had a brief episode of chest discomfort and was evaluated by Dr. Gwenlyn Found. Nuclear stress test was performed and felt to be low risk for ischemia. 2-D  echocardiogram revealed normal left ventricular function without any segmental wall motion abnormalities. The patient was treated medically. Over the last month the patient has developed accelerating symptoms of dull pain in the left upper extremity associated with physical exertion and exertional shortness of breath. Symptoms were reportedly similar to that which occurred prior to stenting in the remote past. The patient was seen in followup by Dr. Gwenlyn Found who felt symptoms were concerning for angina pectoris. Diagnostic cardiac catheterization was performed 11/05/2013 and notable for the presence of 50% stenosis of left main coronary artery with high grade ostial stenosis of left circumflex coronary artery, 75% proximal stenosis of left anterior descending coronary artery and long 50% restenosis of the right coronary artery. Left ventricular systolic function remains normal. The patient has been seen in consultation and counseled at length regarding the indications, risks and potential benefits of surgery.  All questions have been answered, and the patient provides full informed consent for the operation as described.     DETAILS OF THE OPERATIVE PROCEDURE  Preparation:  The patient is brought to the operating room on the above mentioned date and central monitoring was established by the anesthesia team including placement of Swan-Ganz catheter and radial arterial line. The patient is placed in the supine position on the operating table.  Intravenous antibiotics are administered. General endotracheal anesthesia is induced uneventfully. A Foley catheter is placed.  Baseline transesophageal echocardiogram was performed.  Findings were notable for normal LV systolic function.  There was mild (1+) central aortic insufficiency.  The patient's chest, abdomen, both groins, and both lower extremities are prepared and draped in a sterile manner. A time out procedure is performed.  Surgical Approach and  Harvest of Conduit:  A median sternotomy incision was performed and the left internal mammary artery is dissected from the chest wall and prepared for bypass grafting. The left internal mammary artery is notably good quality conduit. Simultaneously, saphenous vein is obtained from the patient's right thigh using endoscopic vein harvest technique. The saphenous vein is notably good quality conduit. After removal of the saphenous vein, the small surgical incisions in the lower extremity are closed with absorbable suture. Following systemic heparinization, the left internal mammary artery was transected distally noted to have excellent flow.   Tru-cut Core Needle Biopsy of Left Upper Lobe:  Ventilation is temporarily held in the left lung was allowed to collapse completely. Lung is palpated carefully. There is a small firm mass deep within the middle of the left upper lobe there is tru-cut core needle biopsy of the mass is performed and the residual specimen sent for permanent histology.   Off-pump Coronary Artery Bypass Grafting:  The pericardium is opened. The ascending aorta is severely diseased with atherosclerosis in appearance.  The aorta is essentially porcelain.  Most of the aorta is heavily calcified.  There are no reasonable places for cannulation for cardiopulmonary bypass, and application of a cross-clamp would be impossible.  The Maquet Acrobat off-pump cardiac stabilization system is utilized to facilitate off-pump coronary revascularization.  Both the apical suction cup and the U-shaped stabilization arm are utilized.  Elastic vessel loops are used for proximal and distal hemostatic control.  Intracoronary shunts are not utilized.   The first obtuse marginal branch of the left circumflex coronary artery was grafted using a reversed saphenous vein graft in an end-to-side fashion.  At the site of distal anastomosis the target vessel was good quality and measured approximately 1.8 mm in  diameter.  The distal left anterior coronary artery was grafted with the left internal mammary artery in an end-to-side fashion.  At the site of distal anastomosis the target vessel was good quality and measured approximately 1.5 mm in diameter.   The single proximal vein graft anastomosis was placed directly to the ascending aorta using the Maquet Heartstring proximal hemostatic device without application of any clamps on the aorta.  All proximal and distal coronary anastomoses were inspected for hemostasis and appropriate graft orientation.    Procedure Completion:  Epicardial pacing wires are fixed to the right atrial appendage.  Followup transesophageal echocardiogram performed after completion of all grafts revealed no changes from the preoperative exam.  Protamine was administered to reverse the anticoagulation.   The mediastinum and pleural space were inspected for hemostasis and irrigated with saline solution. The mediastinum and the left pleural space were drained using 4 chest tubes placed through separate stab incisions inferiorly.  The soft tissues anterior to the aorta were reapproximated loosely. The sternum is closed with double strength sternal wire. The soft tissues anterior to the sternum were closed in multiple layers and the skin is closed with a running subcuticular skin closure.  No blood products were administered during the operation.   Patient Disposition:  The patient tolerated the procedure well and is transported to the surgical intensive care in stable condition. There are no intraoperative complications. All sponge instrument and needle counts are verified correct at completion of the operation.    Valentina Gu. Roxy Manns MD 11/20/2013 11:29 AM

## 2013-11-20 NOTE — Brief Op Note (Addendum)
11/20/2013  10:40 AM  PATIENT:  Glenn Reilly  80 y.o. male  PRE-OPERATIVE DIAGNOSIS:  CAD  POST-OPERATIVE DIAGNOSIS:  CAD  PROCEDURE:  Procedure(s):  OFF PUMP CORONARY ARTERY BYPASS GRAFTING x 2 -LIMA to LAD -SVG to OBTUSE MARGINAL 1  CORE NEEDLE BIOPSY LEFT UPPER LOBE LUNG NODULE  ENDOSCOPIC SAPHENOUS VEIN HARVEST -Right Thigh  INTRAOPERATIVE TRANSESOPHAGEAL ECHOCARDIOGRAM (N/A)  SURGEON:    Rexene Alberts, MD  ASSISTANTS:  Ellwood Handler, PA-C  ANESTHESIA:   Arabella Merles, MD  EBL:  Total I/O In: -  Out: 150 [Urine:150]  BLOOD ADMINISTERED: CELLSAVER  DRAINS: Left Pleural Chest Tubes, Mediastinal Chest Drains   LOCAL MEDICATIONS USED:  NONE  SPECIMEN:  Source of Specimen:  Neele Biopsy Left Upper Lobe  DISPOSITION OF SPECIMEN:  PATHOLOGY  COUNTS:  YES  TOURNIQUET:  * No tourniquets in log *  DICTATION: .Dragon Dictation  PLAN OF CARE: Admit to inpatient   PATIENT DISPOSITION:  ICU - intubated and hemodynamically stable.  FINDINGS:  Severely calcified ascending thoracic aorta  Normal LV systolic function  Mild aortic insufficiency  Good quality LIMA conduit for grafting  Good quality SVG conduit for grafting  Good quality target vessels for grafting  COMPLICATIONS: None  BASELINE WEIGHT: 67 kg  PATIENT DISPOSITION:   TO SICU IN STABLE CONDITION   Delay start of Pharmacological VTE agent (>24hrs) due to surgical blood loss or risk of bleeding: yes  Glenn Reilly 11/20/2013 11:21 AM

## 2013-11-20 NOTE — Anesthesia Procedure Notes (Addendum)
Procedure Name: Intubation Date/Time: 11/20/2013 7:54 AM Performed by: Ollen Bowl Pre-anesthesia Checklist: Patient identified, Emergency Drugs available, Suction available, Patient being monitored and Timeout performed Patient Re-evaluated:Patient Re-evaluated prior to inductionOxygen Delivery Method: Circle system utilized and Simple face mask Preoxygenation: Pre-oxygenation with 100% oxygen Intubation Type: IV induction Ventilation: Mask ventilation without difficulty and Oral airway inserted - appropriate to patient size Laryngoscope Size: Miller and 3 Grade View: Grade II Tube type: Oral Tube size: 8.0 mm Number of attempts: 1 Airway Equipment and Method: Patient positioned with wedge pillow and Stylet Placement Confirmation: ETT inserted through vocal cords under direct vision,  positive ETCO2 and breath sounds checked- equal and bilateral Secured at: 23 cm Tube secured with: Tape Dental Injury: Teeth and Oropharynx as per pre-operative assessment

## 2013-11-20 NOTE — Anesthesia Postprocedure Evaluation (Signed)
  Anesthesia Post-op Note  Patient: Glenn Reilly  Procedure(s) Performed: Procedure(s): INTRAOPERATIVE TRANSESOPHAGEAL ECHOCARDIOGRAM (N/A) OFF PUMP CORONARY ARTERY BYPASS GRAFTING (CABG) x2: LIMA to LAD, SVG to Obtuse Marginal 1. (N/A) BIOPSY Left Upper Lobe (Left)  Patient Location: ICU  Anesthesia Type:General  Level of Consciousness: sedated and unresponsive  Airway and Oxygen Therapy: Patient remains intubated and on ventilator  Post-op Pain: none  Post-op Assessment: Post-op Vital signs reviewed, Patient's Cardiovascular Status Stable and Respiratory Function Stable  Post-op Vital Signs: Reviewed  Filed Vitals:   11/20/13 0556  BP: 138/79  Pulse: 67  Temp: 36.6 C  Resp: 18    Complications: No apparent anesthesia complications

## 2013-11-20 NOTE — Anesthesia Preprocedure Evaluation (Addendum)
Anesthesia Evaluation  Patient identified by MRN, date of birth, ID band Patient awake    Reviewed: Allergy & Precautions, H&P , NPO status , Patient's Chart, lab work & pertinent test results, reviewed documented beta blocker date and time   Airway Mallampati: II TM Distance: >3 FB Neck ROM: Full    Dental no notable dental hx. (+) Edentulous Upper, Edentulous Lower, Dental Advisory Given   Pulmonary neg pulmonary ROS, asthma , COPD COPD inhaler, former smoker,  breath sounds clear to auscultation  Pulmonary exam normal       Cardiovascular hypertension, Pt. on medications + CAD and + Peripheral Vascular Disease negative cardio ROS  Rhythm:Regular Rate:Normal     Neuro/Psych CVA negative neurological ROS  negative psych ROS   GI/Hepatic negative GI ROS, Neg liver ROS,   Endo/Other  negative endocrine ROSHypothyroidism   Renal/GU negative Renal ROS  negative genitourinary   Musculoskeletal   Abdominal   Peds  Hematology negative hematology ROS (+)   Anesthesia Other Findings   Reproductive/Obstetrics negative OB ROS                         Anesthesia Physical Anesthesia Plan  ASA: IV  Anesthesia Plan: General   Post-op Pain Management:    Induction: Intravenous  Airway Management Planned: Oral ETT  Additional Equipment: Arterial line, CVP, PA Cath, TEE and Ultrasound Guidance Line Placement  Intra-op Plan:   Post-operative Plan: Post-operative intubation/ventilation  Informed Consent: I have reviewed the patients History and Physical, chart, labs and discussed the procedure including the risks, benefits and alternatives for the proposed anesthesia with the patient or authorized representative who has indicated his/her understanding and acceptance.   Dental advisory given  Plan Discussed with: CRNA  Anesthesia Plan Comments:         Anesthesia Quick Evaluation

## 2013-11-20 NOTE — Progress Notes (Signed)
  Echocardiogram Echocardiogram Transesophageal has been performed.  Mauricio Po 11/20/2013, 8:25 AM

## 2013-11-20 NOTE — Interval H&P Note (Signed)
History and Physical Interval Note:  11/20/2013 6:20 AM  Glenn Reilly  has presented today for surgery, with the diagnosis of CAD  The various methods of treatment have been discussed with the patient and family. After consideration of risks, benefits and other options for treatment, the patient has consented to  Procedure(s): CORONARY ARTERY BYPASS GRAFTING (CABG) (N/A) INTRAOPERATIVE TRANSESOPHAGEAL ECHOCARDIOGRAM (N/A) as a surgical intervention .  The patient's history has been reviewed, patient examined, no change in status, stable for surgery.  I have reviewed the patient's chart and labs.  Questions were answered to the patient's satisfaction.     OWEN,CLARENCE H

## 2013-11-20 NOTE — Transfer of Care (Signed)
Immediate Anesthesia Transfer of Care Note  Patient: Glenn Reilly  Procedure(s) Performed: Procedure(s): INTRAOPERATIVE TRANSESOPHAGEAL ECHOCARDIOGRAM (N/A) OFF PUMP CORONARY ARTERY BYPASS GRAFTING (CABG) x2: LIMA to LAD, SVG to Obtuse Marginal 1. (N/A) BIOPSY Left Upper Lobe (Left)  Patient Location: PACU and SICU  Anesthesia Type:General  Level of Consciousness: Patient remains intubated per anesthesia plan  Airway & Oxygen Therapy: Patient remains intubated per anesthesia plan and Patient placed on Ventilator (see vital sign flow sheet for setting)  Post-op Assessment: Report given to PACU RN and Post -op Vital signs reviewed and stable  Post vital signs: Reviewed and stable  Complications: No apparent anesthesia complications

## 2013-11-20 NOTE — Progress Notes (Signed)
Attempted to begin rapid wean by placing pt. on a rate of 4 & 40% FIO2. Pt.'s rate immediately went into the 40's. Pt. Was placed back on a rate of 12. RN was made aware. Will attempt to wean again at a later time.

## 2013-11-20 NOTE — Progress Notes (Signed)
CT surgery p.m. Rounds  Status post off-pump CABG x2 Patient remains somewhat weight but with high respiratory rate Hemodynamics are stable Small airleak from left chest tubestatus post needle biopsy 6hr  postop labs are reviewed and are satisfactory

## 2013-11-21 ENCOUNTER — Inpatient Hospital Stay (HOSPITAL_COMMUNITY): Payer: Medicare Other

## 2013-11-21 DIAGNOSIS — Z902 Acquired absence of lung [part of]: Secondary | ICD-10-CM | POA: Diagnosis not present

## 2013-11-21 DIAGNOSIS — C3410 Malignant neoplasm of upper lobe, unspecified bronchus or lung: Secondary | ICD-10-CM | POA: Diagnosis not present

## 2013-11-21 DIAGNOSIS — Z85118 Personal history of other malignant neoplasm of bronchus and lung: Secondary | ICD-10-CM | POA: Diagnosis not present

## 2013-11-21 DIAGNOSIS — Z510 Encounter for antineoplastic radiation therapy: Secondary | ICD-10-CM | POA: Diagnosis not present

## 2013-11-21 LAB — POCT I-STAT 3, ART BLOOD GAS (G3+)
Acid-base deficit: 5 mmol/L — ABNORMAL HIGH (ref 0.0–2.0)
Bicarbonate: 21.9 mEq/L (ref 20.0–24.0)
O2 SAT: 99 %
Patient temperature: 36.8
TCO2: 23 mmol/L (ref 0–100)
pCO2 arterial: 45.1 mmHg — ABNORMAL HIGH (ref 35.0–45.0)
pH, Arterial: 7.293 — ABNORMAL LOW (ref 7.350–7.450)
pO2, Arterial: 134 mmHg — ABNORMAL HIGH (ref 80.0–100.0)

## 2013-11-21 LAB — BASIC METABOLIC PANEL
Anion gap: 12 (ref 5–15)
BUN: 24 mg/dL — AB (ref 6–23)
CALCIUM: 8.3 mg/dL — AB (ref 8.4–10.5)
CO2: 21 meq/L (ref 19–32)
CREATININE: 1.23 mg/dL (ref 0.50–1.35)
Chloride: 109 mEq/L (ref 96–112)
GFR calc Af Amer: 61 mL/min — ABNORMAL LOW (ref >90)
GFR calc non Af Amer: 52 mL/min — ABNORMAL LOW (ref >90)
GLUCOSE: 126 mg/dL — AB (ref 70–99)
Potassium: 4.3 mEq/L (ref 3.7–5.3)
Sodium: 142 mEq/L (ref 137–147)

## 2013-11-21 LAB — CBC
HCT: 26.3 % — ABNORMAL LOW (ref 39.0–52.0)
HCT: 27.5 % — ABNORMAL LOW (ref 39.0–52.0)
HEMOGLOBIN: 9 g/dL — AB (ref 13.0–17.0)
Hemoglobin: 8.7 g/dL — ABNORMAL LOW (ref 13.0–17.0)
MCH: 30 pg (ref 26.0–34.0)
MCH: 29.8 pg (ref 26.0–34.0)
MCHC: 33.1 g/dL (ref 30.0–36.0)
MCHC: 32.7 g/dL (ref 30.0–36.0)
MCV: 90.7 fL (ref 78.0–100.0)
MCV: 91.1 fL (ref 78.0–100.0)
Platelets: 112 K/uL — ABNORMAL LOW (ref 150–400)
Platelets: 125 10*3/uL — ABNORMAL LOW (ref 150–400)
RBC: 2.9 MIL/uL — ABNORMAL LOW (ref 4.22–5.81)
RBC: 3.02 MIL/uL — AB (ref 4.22–5.81)
RDW: 14.5 % (ref 11.5–15.5)
RDW: 14.7 % (ref 11.5–15.5)
WBC: 11 K/uL — ABNORMAL HIGH (ref 4.0–10.5)
WBC: 13.7 10*3/uL — ABNORMAL HIGH (ref 4.0–10.5)

## 2013-11-21 LAB — GLUCOSE, CAPILLARY
GLUCOSE-CAPILLARY: 102 mg/dL — AB (ref 70–99)
GLUCOSE-CAPILLARY: 87 mg/dL (ref 70–99)
GLUCOSE-CAPILLARY: 82 mg/dL (ref 70–99)
GLUCOSE-CAPILLARY: 109 mg/dL — AB (ref 70–99)
GLUCOSE-CAPILLARY: 105 mg/dL — AB (ref 70–99)
Glucose-Capillary: 93 mg/dL (ref 70–99)
Glucose-Capillary: 132 mg/dL — ABNORMAL HIGH (ref 70–99)
Glucose-Capillary: 132 mg/dL — ABNORMAL HIGH (ref 70–99)
Glucose-Capillary: 101 mg/dL — ABNORMAL HIGH (ref 70–99)
Glucose-Capillary: 84 mg/dL (ref 70–99)

## 2013-11-21 LAB — BLOOD GAS, ARTERIAL
ACID-BASE DEFICIT: 4.4 mmol/L — AB (ref 0.0–2.0)
BICARBONATE: 20.6 meq/L (ref 20.0–24.0)
DRAWN BY: 252031
FIO2: 0.4 %
O2 SAT: 99 %
PEEP/CPAP: 5 cmH2O
PO2 ART: 154 mmHg — AB (ref 80.0–100.0)
Patient temperature: 98.6
Pressure support: 10 cmH2O
RATE: 12 resp/min
TCO2: 21.9 mmol/L (ref 0–100)
VT: 600 mL
pCO2 arterial: 40.6 mmHg (ref 35.0–45.0)
pH, Arterial: 7.326 — ABNORMAL LOW (ref 7.350–7.450)

## 2013-11-21 LAB — POCT I-STAT, CHEM 8
BUN: 26 mg/dL — AB (ref 6–23)
CREATININE: 1.3 mg/dL (ref 0.50–1.35)
Calcium, Ion: 1.34 mmol/L — ABNORMAL HIGH (ref 1.13–1.30)
Chloride: 108 mEq/L (ref 96–112)
GLUCOSE: 92 mg/dL (ref 70–99)
HCT: 29 % — ABNORMAL LOW (ref 39.0–52.0)
Hemoglobin: 9.9 g/dL — ABNORMAL LOW (ref 13.0–17.0)
POTASSIUM: 4 meq/L (ref 3.7–5.3)
Sodium: 143 mEq/L (ref 137–147)
TCO2: 21 mmol/L (ref 0–100)

## 2013-11-21 LAB — MAGNESIUM
Magnesium: 2.7 mg/dL — ABNORMAL HIGH (ref 1.5–2.5)
Magnesium: 2.6 mg/dL — ABNORMAL HIGH (ref 1.5–2.5)

## 2013-11-21 LAB — CREATININE, SERUM
Creatinine, Ser: 1.29 mg/dL (ref 0.50–1.35)
GFR calc Af Amer: 57 mL/min — ABNORMAL LOW
GFR calc non Af Amer: 50 mL/min — ABNORMAL LOW

## 2013-11-21 MED ORDER — SODIUM BICARBONATE 8.4 % IV SOLN
50.0000 meq | Freq: Once | INTRAVENOUS | Status: AC
  Administered 2013-11-21: 50 meq via INTRAVENOUS

## 2013-11-21 MED ORDER — INSULIN ASPART 100 UNIT/ML ~~LOC~~ SOLN
0.0000 [IU] | SUBCUTANEOUS | Status: DC
  Administered 2013-11-23 – 2013-11-24 (×2): 2 [IU] via SUBCUTANEOUS

## 2013-11-21 MED ORDER — BIOTENE DRY MOUTH MT LIQD
15.0000 mL | Freq: Four times a day (QID) | OROMUCOSAL | Status: DC
  Administered 2013-11-21 – 2013-11-24 (×14): 15 mL via OROMUCOSAL

## 2013-11-21 MED ORDER — ALPRAZOLAM 0.25 MG PO TABS
0.2500 mg | ORAL_TABLET | Freq: Three times a day (TID) | ORAL | Status: DC | PRN
  Administered 2013-11-21 – 2013-11-22 (×2): 0.25 mg via ORAL
  Filled 2013-11-21 (×2): qty 1

## 2013-11-21 MED ORDER — ALBUTEROL SULFATE (2.5 MG/3ML) 0.083% IN NEBU
2.5000 mg | INHALATION_SOLUTION | Freq: Four times a day (QID) | RESPIRATORY_TRACT | Status: DC | PRN
  Administered 2013-11-21 – 2013-11-23 (×5): 2.5 mg via RESPIRATORY_TRACT
  Filled 2013-11-21 (×5): qty 3

## 2013-11-21 MED ORDER — IPRATROPIUM-ALBUTEROL 0.5-2.5 (3) MG/3ML IN SOLN
3.0000 mL | Freq: Three times a day (TID) | RESPIRATORY_TRACT | Status: DC
  Administered 2013-11-21 – 2013-11-27 (×14): 3 mL via RESPIRATORY_TRACT
  Filled 2013-11-21 (×15): qty 3

## 2013-11-21 MED ORDER — BIOTENE DRY MOUTH MT LIQD
15.0000 mL | Freq: Two times a day (BID) | OROMUCOSAL | Status: DC
  Administered 2013-11-21 – 2013-11-24 (×4): 15 mL via OROMUCOSAL

## 2013-11-21 MED ORDER — CHLORHEXIDINE GLUCONATE 0.12 % MT SOLN
15.0000 mL | Freq: Two times a day (BID) | OROMUCOSAL | Status: DC
  Administered 2013-11-21 – 2013-11-24 (×6): 15 mL via OROMUCOSAL
  Filled 2013-11-21 (×6): qty 15

## 2013-11-21 NOTE — Progress Notes (Signed)
CT scan p.m. Rounds  Patient examined and record reviewed.Hemodynamics stable,labs satisfactory.Patient had stable day.Continue current care. VAN TRIGT III,PETER 11/21/2013

## 2013-11-21 NOTE — Procedures (Signed)
Extubation Procedure Note  Patient Details:   Name: Glenn Reilly DOB: 1929-03-22 MRN: [id number]   Airway Documentation:     Evaluation  O2 sats: stable throughout Complications: No apparent complications Patient did tolerate procedure well. Bilateral Breath Sounds: Clear   Yes  Pt tolerated wean, positive for cuff leak, okay to extubate per Dr. Prescott Gum. Pt extubated to [place]. Pt did have some increased WOB after extubation, and stated he needed neb tx, but no stridor noted. Pt tolerating neb tx, all vital signs are currently within normal limits. RT will continue to monitor.   Mariam Dollar 11/21/2013, 11:35 AM

## 2013-11-21 NOTE — Progress Notes (Signed)
SICU Rapid Wean Protocol started. Pt tolerating at this time. RT Will continue to monitor.

## 2013-11-21 NOTE — Progress Notes (Signed)
Scheduled neb tx for 0800 was not given this morning. Pt was in process of being weaned off the ventilator, and any pt stimulation increased his WOB, and anxiety. Tx given immediately after extubation. RT will continue to monitor.

## 2013-11-21 NOTE — Progress Notes (Signed)
1 Day Post-Op Procedure(s) (LRB): INTRAOPERATIVE TRANSESOPHAGEAL ECHOCARDIOGRAM (N/A) OFF PUMP CORONARY ARTERY BYPASS GRAFTING (CABG) x2: LIMA to LAD, SVG to Obtuse Marginal 1. (N/A) BIOPSY Left Upper Lobe (Left) Subjective: Status post CABG x2 and biopsy of left upper lobe mass Slow extubated to sedation and tachypnea Currently extubated breathing comfortably stable hemodynamics sinus rhythm Left chest tube still has airleak, chest x-ray without pneumothorax  Objective: Vital signs in last 24 hours: Temp:  [97.3 F (36.3 C)-99.1 F (37.3 C)] 98.4 F (36.9 C) (07/11 1400) Pulse Rate:  [79-98] 80 (07/11 1500) Cardiac Rhythm:  [-] Atrial paced (07/11 1200) Resp:  [8-40] 23 (07/11 1500) BP: (81-142)/(42-65) 111/58 mmHg (07/11 1500) SpO2:  [97 %-100 %] 97 % (07/11 1500) Arterial Line BP: (79-158)/(30-135) 109/30 mmHg (07/11 1500) FiO2 (%):  [40 %-50 %] 40 % (07/11 0958) Weight:  [167 lb 5.3 oz (75.9 kg)] 167 lb 5.3 oz (75.9 kg) (07/11 0500)  Hemodynamic parameters for last 24 hours: PAP: (21-42)/(9-24) 31/9 mmHg CO:  [5.1 L/min-6.1 L/min] 5.2 L/min CI:  [2.8 L/min/m2-3.4 L/min/m2] 2.9 L/min/m2  Intake/Output from previous day: 07/10 0701 - 07/11 0700 In: 6796.2 [I.V.:4873.2; Blood:273; NG/GT:50; IV Piggyback:1600] Out: 2575 [Urine:1455; Blood:450; Chest Tube:670] Intake/Output this shift: Total I/O In: 245.4 [I.V.:185.4; NG/GT:60] Out: 645 [Urine:385; Chest Tube:260]  Alert and comfortable Extremities warm Neuro intact  Lab Results:  Recent Labs  11/20/13 1820 11/21/13 0320  WBC 14.4* 11.0*  HGB 9.8*  9.2* 8.7*  HCT 29.6*  27.0* 26.3*  PLT 127* 112*   BMET:  Recent Labs  11/20/13 1820 11/21/13 0320  NA 136* 142  K 4.5 4.3  CL 120* 109  CO2  --  21  GLUCOSE 124* 126*  BUN 19 24*  CREATININE 1.20  1.30 1.23  CALCIUM  --  8.3*    PT/INR:  Recent Labs  11/20/13 1225  LABPROT 19.0*  INR 1.59*   ABG    Component Value Date/Time   PHART 7.293*  11/21/2013 1102   HCO3 21.9 11/21/2013 1102   TCO2 23 11/21/2013 1102   ACIDBASEDEF 5.0* 11/21/2013 1102   O2SAT 99.0 11/21/2013 1102   CBG (last 3)   Recent Labs  11/20/13 2329 11/21/13 0358 11/21/13 1119  GLUCAP 132* 109* 132*    Assessment/Plan: S/P Procedure(s) (LRB): INTRAOPERATIVE TRANSESOPHAGEAL ECHOCARDIOGRAM (N/A) OFF PUMP CORONARY ARTERY BYPASS GRAFTING (CABG) x2: LIMA to LAD, SVG to Obtuse Marginal 1. (N/A) BIOPSY Left Upper Lobe (Left) See progression orders   LOS: 1 day    VAN TRIGT III,Jermall Isaacson 11/21/2013

## 2013-11-22 ENCOUNTER — Inpatient Hospital Stay (HOSPITAL_COMMUNITY): Payer: Medicare Other

## 2013-11-22 LAB — BASIC METABOLIC PANEL
Anion gap: 14 (ref 5–15)
BUN: 29 mg/dL — ABNORMAL HIGH (ref 6–23)
CO2: 21 mEq/L (ref 19–32)
Calcium: 8.7 mg/dL (ref 8.4–10.5)
Chloride: 108 mEq/L (ref 96–112)
Creatinine, Ser: 1.33 mg/dL (ref 0.50–1.35)
GFR calc Af Amer: 55 mL/min — ABNORMAL LOW (ref >90)
GFR calc non Af Amer: 48 mL/min — ABNORMAL LOW (ref >90)
Glucose, Bld: 99 mg/dL (ref 70–99)
Potassium: 4.4 mEq/L (ref 3.7–5.3)
Sodium: 143 mEq/L (ref 137–147)

## 2013-11-22 LAB — GLUCOSE, CAPILLARY
Glucose-Capillary: 77 mg/dL (ref 70–99)
Glucose-Capillary: 84 mg/dL (ref 70–99)
Glucose-Capillary: 90 mg/dL (ref 70–99)
Glucose-Capillary: 90 mg/dL (ref 70–99)
Glucose-Capillary: 94 mg/dL (ref 70–99)
Glucose-Capillary: 98 mg/dL (ref 70–99)

## 2013-11-22 LAB — CBC
HCT: 28 % — ABNORMAL LOW (ref 39.0–52.0)
Hemoglobin: 9.1 g/dL — ABNORMAL LOW (ref 13.0–17.0)
MCH: 29.9 pg (ref 26.0–34.0)
MCHC: 32.5 g/dL (ref 30.0–36.0)
MCV: 92.1 fL (ref 78.0–100.0)
Platelets: 117 10*3/uL — ABNORMAL LOW (ref 150–400)
RBC: 3.04 MIL/uL — ABNORMAL LOW (ref 4.22–5.81)
RDW: 15.1 % (ref 11.5–15.5)
WBC: 12.4 10*3/uL — ABNORMAL HIGH (ref 4.0–10.5)

## 2013-11-22 MED ORDER — TRAMADOL HCL 50 MG PO TABS
50.0000 mg | ORAL_TABLET | Freq: Four times a day (QID) | ORAL | Status: DC | PRN
  Administered 2013-11-22 – 2013-11-24 (×5): 50 mg via ORAL
  Filled 2013-11-22 (×5): qty 1

## 2013-11-22 MED ORDER — FUROSEMIDE 10 MG/ML IJ SOLN
20.0000 mg | Freq: Every day | INTRAMUSCULAR | Status: DC
  Administered 2013-11-22 – 2013-11-24 (×3): 20 mg via INTRAVENOUS
  Filled 2013-11-22 (×5): qty 2

## 2013-11-22 MED ORDER — ALBUTEROL SULFATE (2.5 MG/3ML) 0.083% IN NEBU: 2.5000 mg | INHALATION_SOLUTION | RESPIRATORY_TRACT | Status: DC

## 2013-11-22 MED ORDER — ALPRAZOLAM 0.25 MG PO TABS
0.2500 mg | ORAL_TABLET | Freq: Every evening | ORAL | Status: DC | PRN
  Administered 2013-11-24 (×2): 0.25 mg via ORAL
  Filled 2013-11-22 (×2): qty 1

## 2013-11-22 MED ORDER — ALBUTEROL SULFATE HFA 108 (90 BASE) MCG/ACT IN AERS: 2.0000 | INHALATION_SPRAY | RESPIRATORY_TRACT | Status: DC

## 2013-11-22 MED ORDER — ALBUTEROL SULFATE (2.5 MG/3ML) 0.083% IN NEBU
2.5000 mg | INHALATION_SOLUTION | Freq: Four times a day (QID) | RESPIRATORY_TRACT | Status: DC
  Administered 2013-11-22: 2.5 mg via RESPIRATORY_TRACT
  Filled 2013-11-22: qty 3

## 2013-11-22 MED ORDER — GUAIFENESIN ER 600 MG PO TB12
600.0000 mg | ORAL_TABLET | Freq: Two times a day (BID) | ORAL | Status: DC
  Administered 2013-11-23 – 2013-11-27 (×9): 600 mg via ORAL
  Filled 2013-11-22 (×12): qty 1

## 2013-11-22 MED ORDER — DM-GUAIFENESIN ER 30-600 MG PO TB12
1.0000 | ORAL_TABLET | Freq: Two times a day (BID) | ORAL | Status: DC | PRN
  Administered 2013-11-22: 1 via ORAL
  Filled 2013-11-22: qty 1

## 2013-11-22 NOTE — Progress Notes (Signed)
CT surgery p.m. Rounds  Status post CABG x2 with needle biopsy left upper lobe lung lesion Patient out of bed but not ambulating at Some coughing with by mouth intake-swallow evaluation ordered and is pending Currently patient on a dysphagia diet Maintaining sinus rhythm Oxygenation adequate on multiple bronchodilators Physical therapy pending-patient will probably need skilled nursing facility

## 2013-11-22 NOTE — Progress Notes (Signed)
2 Days Post-Op Procedure(s) (LRB): INTRAOPERATIVE TRANSESOPHAGEAL ECHOCARDIOGRAM (N/A) OFF PUMP CORONARY ARTERY BYPASS GRAFTING (CABG) x2: LIMA to LAD, SVG to Obtuse Marginal 1. (N/A) BIOPSY Left Upper Lobe (Left) Subjective: Recovering from off-pump CABG x2 with biopsy left upper lobe mass Patient very frail with severe COPD and anxiety issues Chest x-ray is improved, air leak resolved - will remove 1 left pleural tube Maintaining sinus rhythm and adequate blood pressure Been gently diuresis for fluid overload Objective: Vital signs in last 24 hours: Temp:  [98.2 F (36.8 C)-100.1 F (37.8 C)] 98.3 F (36.8 C) (07/12 0719) Pulse Rate:  [78-98] 79 (07/12 0800) Cardiac Rhythm:  [-] Atrial paced (07/12 0800) Resp:  [16-43] 24 (07/12 0800) BP: (88-136)/(38-61) 114/49 mmHg (07/12 0800) SpO2:  [89 %-100 %] 96 % (07/12 0800) Arterial Line BP: (75-151)/(29-75) 115/36 mmHg (07/12 0700) Weight:  [158 lb 15.2 oz (72.1 kg)] 158 lb 15.2 oz (72.1 kg) (07/12 0500)  Hemodynamic parameters for last 24 hours: PAP: (30-34)/(9-12) 31/9 mmHg  Intake/Output from previous day: 07/11 0701 - 07/12 0700 In: 1022.1 [P.O.:150; I.V.:712.1; NG/GT:60; IV Piggyback:100] Out: 1520 [Urine:960; Chest Tube:560] Intake/Output this shift: Total I/O In: 67.5 [I.V.:17.5; IV Piggyback:50] Out: 40 [Urine:30; Chest Tube:10]  Exam Patient somewhat sleepy but alert and responsive this morning Breath sounds clear and equal Minimal chest tube drainage with resolved airleak Mild peripheral edema  Lab Results:  Recent Labs  11/21/13 1800 11/21/13 1805 11/22/13 0400  WBC 13.7*  --  12.4*  HGB 9.0* 9.9* 9.1*  HCT 27.5* 29.0* 28.0*  PLT 125*  --  117*   BMET:  Recent Labs  11/21/13 0320  11/21/13 1805 11/22/13 0400  NA 142  --  143 143  K 4.3  --  4.0 4.4  CL 109  --  108 108  CO2 21  --   --  21  GLUCOSE 126*  --  92 99  BUN 24*  --  26* 29*  CREATININE 1.23  < > 1.30 1.33  CALCIUM 8.3*  --   --   8.7  < > = values in this interval not displayed.  PT/INR:  Recent Labs  11/20/13 1225  LABPROT 19.0*  INR 1.59*   ABG    Component Value Date/Time   PHART 7.293* 11/21/2013 1102   HCO3 21.9 11/21/2013 1102   TCO2 21 11/21/2013 1805   ACIDBASEDEF 5.0* 11/21/2013 1102   O2SAT 99.0 11/21/2013 1102   CBG (last 3)   Recent Labs  11/21/13 2344 11/22/13 0359 11/22/13 0714  GLUCAP 84 77 90    Assessment/Plan: S/P Procedure(s) (LRB): INTRAOPERATIVE TRANSESOPHAGEAL ECHOCARDIOGRAM (N/A) OFF PUMP CORONARY ARTERY BYPASS GRAFTING (CABG) x2: LIMA to LAD, SVG to Obtuse Marginal 1. (N/A) BIOPSY Left Upper Lobe (Left) DC left pleural tube, posterior Mobilize, ambulate, physical therapy consult Advance diet Keep in ICU because of pulmonary status    LOS: 2 days    VAN TRIGT III,PETER 11/22/2013

## 2013-11-23 ENCOUNTER — Inpatient Hospital Stay (HOSPITAL_COMMUNITY): Payer: Medicare Other

## 2013-11-23 LAB — GLUCOSE, CAPILLARY
GLUCOSE-CAPILLARY: 112 mg/dL — AB (ref 70–99)
Glucose-Capillary: 88 mg/dL (ref 70–99)
Glucose-Capillary: 84 mg/dL (ref 70–99)
Glucose-Capillary: 102 mg/dL — ABNORMAL HIGH (ref 70–99)
Glucose-Capillary: 119 mg/dL — ABNORMAL HIGH (ref 70–99)
Glucose-Capillary: 138 mg/dL — ABNORMAL HIGH (ref 70–99)

## 2013-11-23 MED ORDER — POLYETHYLENE GLYCOL 3350 17 G PO PACK
17.0000 g | PACK | Freq: Every day | ORAL | Status: DC
  Administered 2013-11-23 – 2013-11-27 (×5): 17 g via ORAL
  Filled 2013-11-23 (×5): qty 1

## 2013-11-23 MED ORDER — LISINOPRIL 5 MG PO TABS
5.0000 mg | ORAL_TABLET | Freq: Every day | ORAL | Status: DC
  Administered 2013-11-23 – 2013-11-27 (×5): 5 mg via ORAL
  Filled 2013-11-23 (×5): qty 1

## 2013-11-23 MED FILL — Magnesium Sulfate Inj 50%: INTRAMUSCULAR | Qty: 10 | Status: AC

## 2013-11-23 MED FILL — Heparin Sodium (Porcine) Inj 1000 Unit/ML: INTRAMUSCULAR | Qty: 30 | Status: AC

## 2013-11-23 MED FILL — Potassium Chloride Inj 2 mEq/ML: INTRAVENOUS | Qty: 40 | Status: AC

## 2013-11-23 MED FILL — Dexmedetomidine HCl in NaCl 0.9% IV Soln 400 MCG/100ML: INTRAVENOUS | Qty: 100 | Status: AC

## 2013-11-23 NOTE — Progress Notes (Signed)
Additional Medicare IM given. Placed on chart.  Jonnie Finner RN CCM Case Mgmt phone [phone]

## 2013-11-23 NOTE — Evaluation (Signed)
Physical Therapy Evaluation Patient Details Name: Glenn Reilly MRN: [id number] DOB: 1928/09/24 Today's Date: 11/23/2013   History of Present Illness  Pt s/p CABG x 2 and biopsy lt lung lesion on 11/20/13. PMH - CVA, COPD, HTN  Clinical Impression  Pt admitted with above. Pt currently with functional limitations due to the deficits listed below (see PT Problem List).  Pt will benefit from skilled PT to increase their independence and safety with mobility to allow discharge to the venue listed below.       Follow Up Recommendations SNF    Equipment Recommendations  Rolling walker with 5" wheels    Recommendations for Other Services       Precautions / Restrictions Precautions Precautions: Sternal;Fall Restrictions Weight Bearing Restrictions:  (sternal precautions)      Mobility  Bed Mobility                  Transfers Overall transfer level: Needs assistance Equipment used: Rolling walker (2 wheeled) Transfers: Sit to/from Stand Sit to Stand: Mod assist         General transfer comment: Verbal cues to place hands on knees to facilitate following sternal precautions. Assist to bring hips up.  Ambulation/Gait Ambulation/Gait assistance: Min assist Ambulation Distance (Feet): 90 Feet Assistive device:  (pushing w/c) Gait Pattern/deviations: Step-through pattern;Decreased step length - right;Decreased step length - left;Trunk flexed Gait velocity: decr Gait velocity interpretation: Below normal speed for age/gender General Gait Details: Verbal cues to stand more erect.  Stairs            Wheelchair Mobility    Modified Rankin (Stroke Patients Only)       Balance Overall balance assessment: Needs assistance         Standing balance support: Bilateral upper extremity supported Standing balance-Leahy Scale: Poor Standing balance comment: Using w/c for support.                             Pertinent Vitals/Pain VSS    Home  Living Family/patient expects to be discharged to:: Private residence Living Arrangements: Alone   Type of Home: House Home Access: Stairs to enter Entrance Stairs-Rails: Right Entrance Stairs-Number of Steps: 4 Home Layout: One level Home Equipment: None      Prior Function Level of Independence: Independent               Hand Dominance        Extremity/Trunk Assessment   Upper Extremity Assessment: Generalized weakness           Lower Extremity Assessment: Generalized weakness         Communication   Communication: No difficulties  Cognition Arousal/Alertness: Awake/alert Behavior During Therapy: WFL for tasks assessed/performed Overall Cognitive Status: Within Functional Limits for tasks assessed                      General Comments      Exercises        Assessment/Plan    PT Assessment Patient needs continued PT services  PT Diagnosis Difficulty walking;Generalized weakness   PT Problem List Decreased strength;Decreased activity tolerance;Decreased balance;Decreased mobility;Decreased knowledge of use of DME  PT Treatment Interventions DME instruction;Patient/family education;Gait training;Stair training;Functional mobility training;Therapeutic activities;Therapeutic exercise;Balance training   PT Goals (Current goals can be found in the Care Plan section) Acute Rehab PT Goals Patient Stated Goal: Return home PT Goal Formulation: With patient Time For Goal Achievement: 11/30/13  Potential to Achieve Goals: Good    Frequency Min 3X/week   Barriers to discharge Decreased caregiver support      Co-evaluation               End of Session   Activity Tolerance: Patient limited by fatigue Patient left: in chair;with call bell/phone within reach Nurse Communication: Mobility status         Time: 1610-9604 PT Time Calculation (min): 27 min   Charges:   PT Evaluation $Initial PT Evaluation Tier I: 1 Procedure PT  Treatments $Gait Training: 23-37 mins   PT G Codes:          Fenton Candee 2013-12-07, 9:55 AM  [facility] PT [phone]

## 2013-11-23 NOTE — Progress Notes (Addendum)
TCTS DAILY ICU PROGRESS NOTE                   [address]            [place] [address]          [phone]   3 Days Post-Op Procedure(s) (LRB): INTRAOPERATIVE TRANSESOPHAGEAL ECHOCARDIOGRAM (N/A) OFF PUMP CORONARY ARTERY BYPASS GRAFTING (CABG) x2: LIMA to LAD, SVG to Obtuse Marginal 1. (N/A) BIOPSY Left Upper Lobe (Left)  Total Length of Stay:  LOS: 3 days   Subjective: OOB in chair.  Feeling better today.  Breathing stable, still with some cough.     Objective: Vital signs in last 24 hours: Temp:  [97.5 F (36.4 C)-98.8 F (37.1 C)] 97.5 F (36.4 C) (07/13 0737) Pulse Rate:  [70-80] 70 (07/13 0800) Cardiac Rhythm:  [-] Atrial paced (07/13 0800) Resp:  [13-25] 22 (07/13 0800) BP: (107-164)/(43-62) 151/50 mmHg (07/13 0800) SpO2:  [91 %-100 %] 100 % (07/13 0800) Arterial Line BP: (130-137)/(43-47) 130/47 mmHg (07/12 1100) Weight:  [156 lb 4.9 oz (70.9 kg)] 156 lb 4.9 oz (70.9 kg) (07/13 0448)  Filed Weights   11/21/13 0500 11/22/13 0500 11/23/13 0448  Weight: 167 lb 5.3 oz (75.9 kg) 158 lb 15.2 oz (72.1 kg) 156 lb 4.9 oz (70.9 kg)  BASELINE WEIGHT: 67 kg   Weight change: -2 lb 10.3 oz (-1.2 kg)   CBGs 97-989-21     Intake/Output from previous day: 07/12 0701 - 07/13 0700 In: 681.3 [P.O.:370; I.V.:261.3; IV Piggyback:50] Out: 2135 [Urine:1895; Chest Tube:240]  Intake/Output this shift: Total I/O In: -  Out: 45 [Urine:45]  Current Meds: Scheduled Meds: . acetaminophen  1,000 mg Oral 4 times per day   Or  . acetaminophen (TYLENOL) oral liquid 160 mg/5 mL  1,000 mg Per Tube 4 times per day  . antiseptic oral rinse  15 mL Mouth Rinse QID  . antiseptic oral rinse  15 mL Mouth Rinse BID  . aspirin EC  325 mg Oral Daily   Or  . aspirin  324 mg Per Tube Daily  . bisacodyl  10 mg Oral Daily   Or  . bisacodyl  10 mg Rectal Daily  . chlorhexidine  15 mL Mouth Rinse BID  . docusate sodium  200 mg Oral Daily  . furosemide  20 mg Intravenous  Daily  . guaiFENesin  600 mg Oral BID  . insulin aspart  0-24 Units Subcutaneous 6 times per day  . ipratropium-albuterol  3 mL Nebulization TID  . levothyroxine  88 mcg Oral QAC breakfast  . metoprolol tartrate  12.5 mg Oral BID   Or  . metoprolol tartrate  12.5 mg Per Tube BID  . pantoprazole  40 mg Oral Daily  . predniSONE  10 mg Oral Q breakfast  . sodium chloride  3 mL Intravenous Q12H   Continuous Infusions: . sodium chloride Stopped (11/21/13 1400)  . sodium chloride 10 mL/hr at 11/22/13 2352  . lactated ringers Stopped (11/20/13 1215)  . phenylephrine (NEO-SYNEPHRINE) Adult infusion Stopped (11/22/13 1300)   PRN Meds:.albuterol, ALPRAZolam, dextromethorphan-guaiFENesin, metoprolol, morphine injection, ondansetron (ZOFRAN) IV, sodium chloride, traMADol   Physical Exam: General appearance: alert, cooperative and no distress Heart: RRR, atrial paced at 70 Lungs: Slightly decreased BS in bases, no wheezes Extremities: Mild LE edema Wound: Clean and dry Chest tube: No air leak  Lab Results: CBC: Recent Labs  11/21/13 1800 11/21/13 1805 11/22/13 0400  WBC 13.7*  --  12.4*  HGB 9.0* 9.9* 9.1*  HCT 27.5* 29.0* 28.0*  PLT 125*  --  117*   BMET:  Recent Labs  11/21/13 0320  11/21/13 1805 11/22/13 0400  NA 142  --  143 143  K 4.3  --  4.0 4.4  CL 109  --  108 108  CO2 21  --   --  21  GLUCOSE 126*  --  92 99  BUN 24*  --  26* 29*  CREATININE 1.23  < > 1.30 1.33  CALCIUM 8.3*  --   --  8.7  < > = values in this interval not displayed.  PT/INR:  Recent Labs  11/20/13 1225  LABPROT 19.0*  INR 1.59*   Radiology: Dg Chest Port 1 View  11/23/2013   CLINICAL DATA:  Assess chest tube  EXAM: PORTABLE CHEST - 1 VIEW  COMPARISON:  Chest x-ray from yesterday  FINDINGS: A mediastinal drain has been removed. A left basilar chest tube and right IJ sheath remain. No definitive pneumothorax. Interval increase in diffuse interstitial coarsening. Bilateral calcified pleural  plaques. Small right pleural effusion, superimposed on chronic pleural thickening.  IMPRESSION: 1. Increasing pulmonary venous congestion. 2. Small right pleural effusion. 3. No pneumothorax.   Electronically Signed   By: Jorje Guild M.D.   On: 11/23/2013 06:46   Dg Chest Port 1 View  11/22/2013   CLINICAL DATA:  Post CABG (11/20/2013)  EXAM: PORTABLE CHEST - 1 VIEW  COMPARISON:  11/21/2013; 11/20/2013; 11/17/2013; chest CT - 11/11/2013  FINDINGS: Grossly unchanged cardiac silhouette and mediastinal contours post median sternotomy. Interval extubation and removal of enteric tube. Interval removal of right jugular approach PA catheter with remaining vascular sheath tip projected over the central aspect of the right internal jugular vein. Interval removal of mediastinal drains. Stable positioning of support apparatus. No pneumothorax. Bilateral pleural calcifications are grossly unchanged. There is unchanged asymmetric right apical pleural parenchymal thickening. No pneumothorax. No definite evidence of edema. Trace bilateral effusions are not excluded. Unchanged bones.  IMPRESSION: 1. Interval extubation and removal of support apparatus as above. Stable positioning of left-sided chest tubes and right jugular approach vascular sheath. No pneumothorax. 2. Trace bilateral effusions without evidence of edema. 3. Stable sequela of prior asbestos exposure.   Electronically Signed   By: Sandi Mariscal M.D.   On: 11/22/2013 10:23     Assessment/Plan: S/P Procedure(s) (LRB): INTRAOPERATIVE TRANSESOPHAGEAL ECHOCARDIOGRAM (N/A) OFF PUMP CORONARY ARTERY BYPASS GRAFTING (CABG) x2: LIMA to LAD, SVG to Obtuse Marginal 1. (N/A) BIOPSY Left Upper Lobe (Left)  CV- Bradycardia, presently paced at 70.  Slow 60s under pacer.  Will leave at current setting for now. BPs trending up.  Will continue beta blocker at current dose for now, restart low dose ACE-I and watch Cr.  Pulm- continue home nebs, inhalers, etc.   IS/FV/Mucinex.  Down to 2L O2, continue to wean as tolerated.  Expected postop blood loss anemia- Reilly/Reilly stable.  Vol overload- continue gentle diuresis.  Dysphagia- spoke with speech pathologist- will start regular diet with thin liquids and aspiration precautions.    GI- pt requesting Miralax. Will order.  +passing flatus, but no BM yet.  Deconditioning- continue PT, ambulation.  CT output remains high, over 200 ml over past 24 hrs. Will leave CT for now. Possibly place to water seal since no air leak and CXR stable.  COLLINS,Glenn Reilly 11/23/2013 8:41 AM  Patient densely stronger No airleak from left pleural tube following lung biopsy-passed still pending Place chest tube  to water seal today and pulled tomorrow Chest x-ray today shows improved aeration no pneumothorax Patient passed swallow test and oral intake is encouraged

## 2013-11-23 NOTE — Evaluation (Signed)
Clinical/Bedside Swallow Evaluation Patient Details  Name: Glenn Reilly MRN: [id number] Date of Birth: 1928/10/22  Today's Date: 11/23/2013 Time: 0810-0840 SLP Time Calculation (min): 30 min  Past Medical History:  Past Medical History  Diagnosis Date  . PVD (peripheral vascular disease) 2011    carotid endarterectomy, pv stents  . Stroke 07/2009    left carotid endarterectomy  . Lung cancer 2001    lower right lobe surgery to remove cancer, no chemo or radiation  . HTN (hypertension)   . Hyperlipemia   . Carotid artery disease   . Detached retina   . Cataract   . COPD (chronic obstructive pulmonary disease)   . Asbestosis   . CAD, [facility] PCI 2004 Cath 11/05/2013 with 50% LMain, 75% prox LAD, 80% prox LCx, 50% RCA with normal EF    . 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 Lung mass 10/19/2013    CT of the chest 10/12/2013 reveals an enlarging left upper lobe irregular nodule. Now 1.4 compared to 1.0 cm in diameter when compared to March 2015 CT scan. There is calcified pleural plaques seen bilaterally. There is subpleural scarring seen as well. Previous right lower lobectomy has been demonstrated.  PET-CT scan from June 2015 is reviewed this shows increased uptake (SUV > 8) in left upper   . HYPERLIPIDEMIA-MIXED 01/13/2010    Qualifier: Diagnosis of  By: Olevia Perches, MD, Glenetta Hew   . COPD with emphysema 01/13/2010    Spirometry 10/19/2013 is reviewed. This reveals FEV1 44% predicted FVC 47% predicted and FEF 25 7537% predicted   . PVD 01/13/2010    Qualifier: Diagnosis of  By: Olevia Perches, MD, Glenetta Hew   . HYPERTENSION, BENIGN 01/13/2010    Qualifier: Diagnosis of  By: Olevia Perches, MD, Glenetta Hew   . Asthma   . Hypothyroidism   . Factor 5 Leiden mutation, heterozygous   . Complication of  anesthesia     lt side throat parlysis  . Dysphagia as late effect of stroke     Developed as complication of stroke following left carotid endarterectomy with documented left hypoglossal nerve paralysis   . S/P CABG x 2 11/20/2013    Off-pump CABG x2 using LIMA to LAD, SVG to OM1, EVH via right thigh   Past Surgical History:  Past Surgical History  Procedure Laterality Date  . Appendectomy    . Hemorrhoid surgery    . 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 Video assisted thoracoscopy (vats)/empyema Right 07/13/1999    drainage of post-operative empyema following RLLobectomy  . Carotid endarterectomy Left 2011    [facility] - complicated by post-op stroke  . Iliac artery stent Bilateral 2011    [facility] - performed for asymptomatic iliac artery disease  . Fracture surgery Left     foot  . Prostate surgery      TURP  . Percutaneous coronary stent intervention (pci-s)  1999    RCA   HPI:  80 year old male with known history of coronary artery disease, hypertension, hyperlipidemia, cerebrovascular disease with previous stroke, PVD, COPD, remote history of tobacco abuse, remote history of lung cancer treated with right lower lobectomy, and recently discovered enlarging  mass in the left upper lobe worrisome for new primary lung cancer was admitted for CABG.   Pt reports h/o dysphagia and dysarthria from his previous stroke.  Bedside swallow evaluation ordered.     Assessment / Plan / Recommendation Clinical Impression  Pt presents with swallow function/ability that is at baseline level (from previous CVA).  Lingual atrophy, weakness on left noted with decreased palatal elevation for which pt compensates by consuming frequent liquids throughout intake and masticating on right side of oral cavity.  Pt able to perform dry swallow with adequate laryngeal elevation palpated at bedside.  No  clinical indications of airway compromise or aspiration noted.    Recommend pt have regular/thin diet to allow him to chose items he can manage given his keen awareness of his dysphagia.    SLP educated pt to aspiration precautions and compensation strategies, will sign off.  Please reorder if indicated.      Aspiration Risk  Mild    Diet Recommendation Regular;Thin liquid   Liquid Administration via: Cup;Straw Medication Administration: Whole meds with liquid (defer to pt for medication administration) Supervision: Patient able to self feed Compensations: Slow rate;Small sips/bites;Check for pocketing (start meal with liquids) Postural Changes and/or Swallow Maneuvers: Seated upright 90 degrees;Upright 30-60 min after meal    Other  Recommendations Oral Care Recommendations: Oral care BID   Follow Up Recommendations  None    Frequency and Duration     n/a   Pertinent Vitals/Pain Afebrile, decreased      Swallow Study Prior Functional Status   pt with h/o dysphagia for which he compensates    General Date of Onset: 11/23/13 HPI: 80 year old male with known history of coronary artery disease, hypertension, hyperlipidemia, cerebrovascular disease with previous stroke, PVD, COPD, remote history of tobacco abuse, remote history of lung cancer treated with right lower lobectomy, and recently discovered enlarging mass in the left upper lobe worrisome for new primary lung cancer was admitted for CABG.   Pt reports h/o dysphagia and dysarthria from his previous stroke.  Bedside swallow evaluation ordered.   Type of Study: Bedside swallow evaluation Diet Prior to this Study: Dysphagia 1 (puree);Nectar-thick liquids Temperature Spikes Noted: No Respiratory Status: Nasal cannula History of Recent Intubation: Yes (for bypass surgery) Behavior/Cognition: Alert;Cooperative;Pleasant mood Oral Cavity - Dentition: Dentures, top;Dentures, bottom Self-Feeding Abilities: Able to feed  self Patient Positioning: Upright in chair Baseline Vocal Quality: Clear Volitional Cough: Other (Comment) (DNT secondary to heart surgery) Volitional Swallow: Able to elicit    Oral/Motor/Sensory Function Overall Oral Motor/Sensory Function: Impaired at baseline (h/o left lingual atrophy, weakness, decr ROM from CVA, decr palatal elevation)   Ice Chips Ice chips: Not tested   Thin Liquid Thin Liquid: Within functional limits Presentation: Cup;Self Fed;Straw    Nectar Thick Nectar Thick Liquid: Within functional limits Presentation: Straw;Self Fed   Honey Thick Honey Thick Liquid: Not tested   Puree Puree: Within functional limits Presentation: Self Fed;Spoon   Solid   GO    Solid: Within functional limits Presentation: Self Fed Other Comments: slow but effective mastication, trace stasis in oral cavity left buccal area       Claudie Fisherman, [place] [facility] SLP [phone]

## 2013-11-24 ENCOUNTER — Ambulatory Visit: Payer: Medicare Other | Admitting: Critical Care Medicine

## 2013-11-24 ENCOUNTER — Inpatient Hospital Stay (HOSPITAL_COMMUNITY): Payer: Medicare Other

## 2013-11-24 ENCOUNTER — Encounter (HOSPITAL_COMMUNITY): Payer: Self-pay | Admitting: Thoracic Surgery (Cardiothoracic Vascular Surgery)

## 2013-11-24 LAB — GLUCOSE, CAPILLARY
GLUCOSE-CAPILLARY: 83 mg/dL (ref 70–99)
GLUCOSE-CAPILLARY: 139 mg/dL — AB (ref 70–99)
GLUCOSE-CAPILLARY: 111 mg/dL — AB (ref 70–99)
GLUCOSE-CAPILLARY: 91 mg/dL (ref 70–99)
Glucose-Capillary: 112 mg/dL — ABNORMAL HIGH (ref 70–99)
Glucose-Capillary: 141 mg/dL — ABNORMAL HIGH (ref 70–99)
Glucose-Capillary: 84 mg/dL (ref 70–99)
Glucose-Capillary: 98 mg/dL (ref 70–99)

## 2013-11-24 LAB — CBC
HEMATOCRIT: 25.5 % — AB (ref 39.0–52.0)
HEMOGLOBIN: 8.3 g/dL — AB (ref 13.0–17.0)
MCH: 29.4 pg (ref 26.0–34.0)
MCHC: 32.5 g/dL (ref 30.0–36.0)
MCV: 90.4 fL (ref 78.0–100.0)
PLATELETS: 132 10*3/uL — AB (ref 150–400)
RBC: 2.82 MIL/uL — AB (ref 4.22–5.81)
RDW: 15.4 % (ref 11.5–15.5)
WBC: 9.4 10*3/uL (ref 4.0–10.5)

## 2013-11-24 LAB — BASIC METABOLIC PANEL
Anion gap: 11 (ref 5–15)
BUN: 37 mg/dL — ABNORMAL HIGH (ref 6–23)
CO2: 27 meq/L (ref 19–32)
Calcium: 9.2 mg/dL (ref 8.4–10.5)
Chloride: 108 mEq/L (ref 96–112)
Creatinine, Ser: 1.36 mg/dL — ABNORMAL HIGH (ref 0.50–1.35)
GFR calc Af Amer: 54 mL/min — ABNORMAL LOW (ref >90)
GFR, EST NON AFRICAN AMERICAN: 46 mL/min — AB (ref >90)
GLUCOSE: 95 mg/dL (ref 70–99)
POTASSIUM: 3.9 meq/L (ref 3.7–5.3)
Sodium: 146 mEq/L (ref 137–147)

## 2013-11-24 MED ORDER — INSULIN ASPART 100 UNIT/ML ~~LOC~~ SOLN
0.0000 [IU] | Freq: Three times a day (TID) | SUBCUTANEOUS | Status: DC
  Administered 2013-11-24 – 2013-11-25 (×3): 2 [IU] via SUBCUTANEOUS

## 2013-11-24 MED ORDER — ENSURE COMPLETE PO LIQD
237.0000 mL | Freq: Three times a day (TID) | ORAL | Status: DC
  Administered 2013-11-24 – 2013-11-26 (×6): 237 mL via ORAL

## 2013-11-24 MED ORDER — SODIUM CHLORIDE 0.9 % IV SOLN: 250.0000 mL | INTRAVENOUS | Status: DC | PRN

## 2013-11-24 MED ORDER — SODIUM CHLORIDE 0.9 % IJ SOLN: 3.0000 mL | INTRAMUSCULAR | Status: DC | PRN

## 2013-11-24 MED ORDER — SORBITOL 70 % SOLN
60.0000 mL | Freq: Every day | Status: DC | PRN
  Filled 2013-11-24: qty 60

## 2013-11-24 MED ORDER — SODIUM CHLORIDE 0.9 % IJ SOLN
3.0000 mL | Freq: Two times a day (BID) | INTRAMUSCULAR | Status: DC
  Administered 2013-11-24 – 2013-11-26 (×4): 3 mL via INTRAVENOUS

## 2013-11-24 MED ORDER — ENOXAPARIN SODIUM 40 MG/0.4ML ~~LOC~~ SOLN
40.0000 mg | SUBCUTANEOUS | Status: DC
  Administered 2013-11-24 – 2013-11-26 (×3): 40 mg via SUBCUTANEOUS
  Filled 2013-11-24 (×4): qty 0.4

## 2013-11-24 MED ORDER — ENSURE PO LIQD: 237.0000 mL | Freq: Three times a day (TID) | ORAL | Status: DC

## 2013-11-24 MED ORDER — MOVING RIGHT ALONG BOOK
Freq: Once | Status: AC
  Administered 2013-11-24: 17:00:00
  Filled 2013-11-24: qty 1

## 2013-11-24 MED ORDER — FUROSEMIDE 40 MG PO TABS
40.0000 mg | ORAL_TABLET | Freq: Every day | ORAL | Status: DC
  Administered 2013-11-25 – 2013-11-27 (×3): 40 mg via ORAL
  Filled 2013-11-24 (×3): qty 1

## 2013-11-24 MED ORDER — MAGNESIUM HYDROXIDE 400 MG/5ML PO SUSP: 30.0000 mL | Freq: Every day | ORAL | Status: DC | PRN

## 2013-11-24 MED FILL — Potassium Chloride Inj 2 mEq/ML: INTRAVENOUS | Qty: 40 | Status: AC

## 2013-11-24 MED FILL — Heparin Sodium (Porcine) Inj 1000 Unit/ML: INTRAMUSCULAR | Qty: 30 | Status: AC

## 2013-11-24 MED FILL — Dexmedetomidine HCl IV Soln 200 MCG/2ML: INTRAVENOUS | Qty: 2 | Status: AC

## 2013-11-24 MED FILL — Magnesium Sulfate Inj 50%: INTRAMUSCULAR | Qty: 10 | Status: AC

## 2013-11-24 NOTE — Progress Notes (Signed)
Clinical Social Work Department CLINICAL SOCIAL WORK PLACEMENT NOTE 11/24/2013  Patient:  Glenn Reilly, Glenn Reilly  Account Number:  [id number] Long date:  11/20/2013  Clinical Social Worker:  Megan Salon  Date/time:  11/24/2013 01:24 PM  Clinical Social Work is seeking post-discharge placement for this patient at the following level of care:   [place]   (*CSW will update this form in Epic as items are completed)   11/24/2013  Patient/family provided with [place] Department of Clinical Social Work's list of facilities offering this level of care within the geographic area requested by the patient (or if unable, by the patient's family).  11/24/2013  Patient/family informed of their freedom to choose among providers that offer the needed level of care, that participate in Medicare, Medicaid or managed care program needed by the patient, have an available bed and are willing to accept the patient.  11/24/2013  Patient/family informed of [facility]' ownership interest in [facility], as well as of the fact that they are under no obligation to receive care at this facility.  PASARR submitted to EDS on  PASARR number received on   FL2 transmitted to all facilities in geographic area requested by pt/family on  11/24/2013 FL2 transmitted to all facilities within larger geographic area on   Patient informed that his/her managed care company has contracts with or will negotiate with  certain facilities, including the following:     Patient/family informed of bed offers received:   Patient chooses bed at  Physician recommends and patient chooses bed at    Patient to be transferred to  on   Patient to be transferred to facility by  Patient and family notified of transfer on  Name of family member notified:    The following physician request were entered in Epic:   Additional Comments:  Jeanette Caprice, MSW, [place]

## 2013-11-24 NOTE — Progress Notes (Signed)
Clinical Social Work Department BRIEF PSYCHOSOCIAL ASSESSMENT 11/24/2013  Patient:  Glenn Reilly,Glenn Reilly     Account Number:  [id number]     Admit date:  11/20/2013  Clinical Social Worker:  PURITZ,LINDSAY, LCSWA  Date/Time:  11/24/2013 01:22 PM  Referred by:  Care Management  Date Referred:  11/24/2013 Referred for  SNF Placement   Other Referral:   Interview type:  Patient Other interview type:    PSYCHOSOCIAL DATA Living Status:  ALONE Admitted from facility:   Level of care:   Primary support name:  Michael Kingbird Primary support relationship to patient:  CHILD, ADULT Degree of support available:   Good    CURRENT CONCERNS Current Concerns  Post-Acute Placement   Other Concerns:    SOCIAL WORK ASSESSMENT / PLAN Clinical Social Worker received referral for SNF placement at d/c. Clinical Social Worker met with patient at bedside to offer support and discuss patient needs at discharge.  CSW introduced self and explained reason for visit. CSW explained SNF process to patient.  Patient reported he is agreeable for SNF placement. . Patient states he has been to a SNF in the past and prefers [place] [facility]. Patient gave social worker permission to speak with patient's son. CSW will complete FL2 for MD's signature and will update patient and family when bed offers are received.    CSW remains available for support and to facilitate patient discharge needs once medically ready.   Assessment/plan status:  Psychosocial Support/Ongoing Assessment of Needs Other assessment/ plan:   Information/referral to community resources:   SNF information    PATIENT'S/FAMILY'S RESPONSE TO PLAN OF CARE: Patient states he is agreeable to placement and perfers [place].        Lindsay Puritz, MSW, LCSWA [phone]  

## 2013-11-24 NOTE — Progress Notes (Signed)
4 Days Post-Op Procedure(s) (LRB): INTRAOPERATIVE TRANSESOPHAGEAL ECHOCARDIOGRAM (N/A) OFF PUMP CORONARY ARTERY BYPASS GRAFTING (CABG) x2: LIMA to LAD, SVG to Obtuse Marginal 1. (N/A) BIOPSY Left Upper Lobe (Left) Subjective: Stronger, walking in hall passed swallow eval nsr No air leak from L CT post lung bx-- will remove Path pending on bx LUL Objective: Vital signs in last 24 hours: Temp:  [97.9 F (36.6 C)-99 F (37.2 C)] 98.1 F (36.7 C) (07/14 1158) Pulse Rate:  [65-73] 66 (07/14 1200) Cardiac Rhythm:  [-] Normal sinus rhythm (07/14 1200) Resp:  [15-31] 19 (07/14 1200) BP: (98-152)/(48-69) 131/61 mmHg (07/14 1200) SpO2:  [90 %-98 %] 97 % (07/14 1200) Weight:  [155 lb 3.3 oz (70.4 kg)] 155 lb 3.3 oz (70.4 kg) (07/14 0500)  Hemodynamic parameters for last 24 hours:   afeb nsr Intake/Output from previous day: 07/13 0701 - 07/14 0700 In: 1080 [P.O.:1080] Out: 2135 [Urine:1955; Chest Tube:180] Intake/Output this shift: Total I/O In: 120 [P.O.:120] Out: 1425 [Urine:1335; Chest Tube:90]  Lungs clear abd soft   Lab Results:  Recent Labs  11/22/13 0400 11/24/13 0429  WBC 12.4* 9.4  HGB 9.1* 8.3*  HCT 28.0* 25.5*  PLT 117* 132*   BMET:  Recent Labs  11/22/13 0400 11/24/13 0429  NA 143 146  K 4.4 3.9  CL 108 108  CO2 21 27  GLUCOSE 99 95  BUN 29* 37*  CREATININE 1.33 1.36*  CALCIUM 8.7 9.2    PT/INR: No results found for this basename: LABPROT, INR,  in the last 72 hours ABG    Component Value Date/Time   PHART 7.293* 11/21/2013 1102   HCO3 21.9 11/21/2013 1102   TCO2 21 11/21/2013 1805   ACIDBASEDEF 5.0* 11/21/2013 1102   O2SAT 99.0 11/21/2013 1102   CBG (last 3)   Recent Labs  11/24/13 0351 11/24/13 0755 11/24/13 1154  GLUCAP 83 84 139*    Assessment/Plan: S/P Procedure(s) (LRB): INTRAOPERATIVE TRANSESOPHAGEAL ECHOCARDIOGRAM (N/A) OFF PUMP CORONARY ARTERY BYPASS GRAFTING (CABG) x2: LIMA to LAD, SVG to Obtuse Marginal 1. (N/A) BIOPSY  Left Upper Lobe (Left) tx to stepdown F/u path on L lung bx done at time of CABG Chg to po lasix Pt states daughter- in law will stay with him at DC   LOS: 4 days    VAN TRIGT III,PETER 11/24/2013

## 2013-11-24 NOTE — Progress Notes (Signed)
CT surgery p.m. Rounds  Patient had excellent day-left chest tube removed Patient ambulating  Further                  strong cough  Pathology report on biopsy shows squamous cell carcinoma of the lung-patient family will contact his oncologist Dr. Hinton Rao for outpatient followup Hope to move to step down unit tomorrow [facility] consult inpatient rehabilitation-CIR for evaluation

## 2013-11-25 ENCOUNTER — Encounter: Payer: Self-pay | Admitting: Radiation Oncology

## 2013-11-25 ENCOUNTER — Institutional Professional Consult (permissible substitution): Payer: Medicare Other | Admitting: Radiation Oncology

## 2013-11-25 ENCOUNTER — Inpatient Hospital Stay (HOSPITAL_COMMUNITY): Payer: Medicare Other

## 2013-11-25 ENCOUNTER — Encounter: Payer: Medicare Other | Admitting: Surgery

## 2013-11-25 DIAGNOSIS — R5381 Other malaise: Secondary | ICD-10-CM

## 2013-11-25 LAB — BASIC METABOLIC PANEL
Anion gap: 12 (ref 5–15)
BUN: 38 mg/dL — ABNORMAL HIGH (ref 6–23)
CO2: 28 mEq/L (ref 19–32)
Calcium: 9.6 mg/dL (ref 8.4–10.5)
Chloride: 108 mEq/L (ref 96–112)
Creatinine, Ser: 1.29 mg/dL (ref 0.50–1.35)
GFR calc Af Amer: 57 mL/min — ABNORMAL LOW (ref >90)
GFR calc non Af Amer: 50 mL/min — ABNORMAL LOW (ref >90)
Glucose, Bld: 90 mg/dL (ref 70–99)
Potassium: 4.5 mEq/L (ref 3.7–5.3)
Sodium: 148 mEq/L — ABNORMAL HIGH (ref 137–147)

## 2013-11-25 LAB — GLUCOSE, CAPILLARY
Glucose-Capillary: 87 mg/dL (ref 70–99)
Glucose-Capillary: 109 mg/dL — ABNORMAL HIGH (ref 70–99)
Glucose-Capillary: 133 mg/dL — ABNORMAL HIGH (ref 70–99)
Glucose-Capillary: 122 mg/dL — ABNORMAL HIGH (ref 70–99)

## 2013-11-25 LAB — CBC
HCT: 27.6 % — ABNORMAL LOW (ref 39.0–52.0)
Hemoglobin: 9 g/dL — ABNORMAL LOW (ref 13.0–17.0)
MCH: 29.6 pg (ref 26.0–34.0)
MCHC: 32.6 g/dL (ref 30.0–36.0)
MCV: 90.8 fL (ref 78.0–100.0)
Platelets: 165 10*3/uL (ref 150–400)
RBC: 3.04 MIL/uL — ABNORMAL LOW (ref 4.22–5.81)
RDW: 15.7 % — ABNORMAL HIGH (ref 11.5–15.5)
WBC: 9 10*3/uL (ref 4.0–10.5)

## 2013-11-25 MED ORDER — CHLORHEXIDINE GLUCONATE CLOTH 2 % EX PADS
6.0000 | MEDICATED_PAD | Freq: Every day | CUTANEOUS | Status: DC
  Administered 2013-11-26 – 2013-11-27 (×2): 6 via TOPICAL

## 2013-11-25 MED ORDER — MUPIROCIN 2 % EX OINT
1.0000 "application " | TOPICAL_OINTMENT | Freq: Two times a day (BID) | CUTANEOUS | Status: DC
  Administered 2013-11-25 – 2013-11-27 (×5): 1 via NASAL
  Filled 2013-11-25: qty 22

## 2013-11-25 NOTE — Consult Note (Signed)
Physical Medicine and Rehabilitation Consult  Reason for Consult: Deconditioning past CABG Referring Physician: Dr. Prescott Gum   HPI: Glenn Reilly is a 80 y.o. male with history of CVA, COPD, asbestosis, lung cancer, CAD with progressive chest pain with SOB and work up revealing severe LAD disease as well as LUL nodule. He was admitted on 11/20/13 for off pump CABG with neddle biopsy of lung mass. He tolerated extubation without difficulty but continues to have SOB with anxiety. CT d/c yesterday and being treated with laxis for gentle diuresis. Pathology positive for squamous cell cancer and Hem/Onc consulted for input. ST evaluation done and patient started on regular diet. PT evaluation done revealing impaired balance with generalized weakness. MD recommending CIR for progression.   Pt without pain during sitting or deep breathing , reportedly ambulated with RN 200' this am using walker  Review of Systems  Constitutional: Positive for weight loss.  Eyes: Negative for blurred vision and double vision.  Respiratory: Positive for cough, shortness of breath and wheezing.   Cardiovascular: Negative for chest pain and palpitations.  Gastrointestinal: Negative for heartburn, nausea, vomiting and abdominal pain.  Musculoskeletal: Positive for back pain and myalgias.  Neurological: Positive for weakness. Negative for headaches.  Psychiatric/Behavioral: Negative for depression. The patient has insomnia.     Past Medical History  Diagnosis Date  . PVD (peripheral vascular disease) 2011    carotid endarterectomy, pv stents  . Stroke 07/2009    left carotid endarterectomy  . Lung cancer 2001    lower right lobe surgery to remove cancer, no chemo or radiation  . HTN (hypertension)   . Hyperlipemia   . Carotid artery disease   . Detached retina   . Cataract   . COPD (chronic obstructive pulmonary disease)   . Asbestosis   . CAD, [place] PCI 2004 Cath  11/05/2013 with 50% LMain, 75% prox LAD, 80% prox LCx, 50% RCA with normal EF    . 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 Lung mass 10/19/2013    CT of the chest 10/12/2013 reveals an enlarging left upper lobe irregular nodule. Now 1.4 compared to 1.0 cm in diameter when compared to March 2015 CT scan. There is calcified pleural plaques seen bilaterally. There is subpleural scarring seen as well. Previous right lower lobectomy has been demonstrated.  PET-CT scan from June 2015 is reviewed this shows increased uptake (SUV > 8) in left upper   . HYPERLIPIDEMIA-MIXED 01/13/2010    Qualifier: Diagnosis of  By: Olevia Perches, MD, Glenetta Hew   . COPD with emphysema 01/13/2010    Spirometry 10/19/2013 is reviewed. This reveals FEV1 44% predicted FVC 47% predicted and FEF 25 7537% predicted   . PVD 01/13/2010    Qualifier: Diagnosis of  By: Olevia Perches, MD, Glenetta Hew   . HYPERTENSION, BENIGN 01/13/2010    Qualifier: Diagnosis of  By: Olevia Perches, MD, Glenetta Hew   . Asthma   . Hypothyroidism   . Factor 5 Leiden mutation, heterozygous   . Complication of anesthesia     lt side throat parlysis  . Dysphagia as late effect of stroke     Developed as complication of stroke following left carotid endarterectomy with documented left hypoglossal nerve paralysis   . S/P  CABG x 2 11/20/2013    Off-pump CABG x2 using LIMA to LAD, SVG to OM1, EVH via right thigh    Past Surgical History  Procedure Laterality Date  . Appendectomy    . Hemorrhoid surgery    . 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 Video assisted thoracoscopy (vats)/empyema Right 07/13/1999    drainage of post-operative empyema following RLLobectomy  . Carotid endarterectomy Left 2011    [facility] -  complicated by post-op stroke  . Iliac artery stent Bilateral 2011    [facility] - performed for asymptomatic iliac artery disease  . Fracture surgery Left     foot  . Prostate surgery      TURP  . Percutaneous coronary stent intervention (pci-s)  1999    RCA  . Intraoperative transesophageal echocardiogram N/A 11/20/2013    Procedure: INTRAOPERATIVE TRANSESOPHAGEAL ECHOCARDIOGRAM;  Surgeon: Rexene Alberts, MD;  Location: [place];  Service: Open Heart Surgery;  Laterality: N/A;  . Coronary artery bypass graft N/A 11/20/2013    Procedure: OFF PUMP CORONARY ARTERY BYPASS GRAFTING (CABG) x2: LIMA to LAD, SVG to Obtuse Marginal 1.;  Surgeon: Rexene Alberts, MD;  Location: [place];  Service: Open Heart Surgery;  Laterality: N/A;  . Esophageal biopsy Left 11/20/2013    Procedure: BIOPSY Left Upper Lobe;  Surgeon: Rexene Alberts, MD;  Location: [place];  Service: Open Heart Surgery;  Laterality: Left;    Family History  Problem Relation Age of Onset  . Heart attack Mother   . Heart attack Father   . Cancer Brother     esophagus and lung  . Cancer Brother     lung    Social History:   Lives alone. Son and daughter in law live next door and can check in past discharge.  Retired from [employer]. He reports that he quit smoking about 34 years ago. His smoking use included Cigarettes. He has a 30 pack-year smoking history. His smokeless tobacco use includes Chew. He reports that he does not drink alcohol or use illicit drugs.   Allergies  Allergen Reactions  . Demerol [Meperidine] Other (See Comments)    shock    Medications Prior to Admission  Medication Sig Dispense Refill  . albuterol (PROVENTIL HFA;VENTOLIN HFA) 108 (90 BASE) MCG/ACT inhaler Inhale 2 puffs into the lungs every 6 (six) hours as needed for wheezing or shortness of breath.      Marland Kitchen amLODipine (NORVASC) 2.5 MG tablet Take 1 tablet (2.5 mg total) by mouth daily.  180 tablet  3  . aspirin 81 MG tablet Take 81  mg by mouth daily.      . benzonatate (TESSALON) 200 MG capsule Take 200 mg by mouth 3 (three) times daily as needed for cough.      . furosemide (LASIX) 20 MG tablet Take 20 mg by mouth daily.      . hydrALAZINE (APRESOLINE) 25 MG tablet Take 25 mg by mouth 3 (three) times daily.      Marland Kitchen ipratropium-albuterol (DUONEB) 0.5-2.5 (3) MG/3ML SOLN Take 3 mLs by nebulization 3 (three) times daily.      Marland Kitchen levofloxacin (LEVAQUIN) 500 MG tablet Take 500 mg by mouth daily.      Marland Kitchen levothyroxine (SYNTHROID, LEVOTHROID) 88 MCG tablet Take 88 mcg by mouth daily before breakfast.      . lisinopril (PRINIVIL,ZESTRIL) 40 MG tablet  Take 40 mg by mouth daily.      Marland Kitchen OVER THE COUNTER MEDICATION Take 1 tablet by mouth daily. Mega Red Omega 3 capsule      . predniSONE (DELTASONE) 10 MG tablet Take 10 mg by mouth daily.        Home: Home Living Family/patient expects to be discharged to:: Private residence Living Arrangements: Alone Type of Home: House Home Access: Stairs to enter Technical brewer of Steps: 4 Entrance Stairs-Rails: Right Home Layout: One level Home Equipment: None  Functional History: Prior Function Level of Independence: Independent Functional Status:  Mobility:   Transfers Overall transfer level: Needs assistance Equipment used: Rolling walker (2 wheeled) Transfers: Sit to/from Stand Sit to Stand: Mod assist General transfer comment: Verbal cues to place hands on knees to facilitate following sternal precautions. Assist to bring hips up. Ambulation/Gait Ambulation/Gait assistance: Min assist Ambulation Distance (Feet): 90 Feet Assistive device:  (pushing w/c) Gait Pattern/deviations: Step-through pattern;Decreased step length - right;Decreased step length - left;Trunk flexed Gait velocity: decr Gait velocity interpretation: Below normal speed for age/gender General Gait Details: Verbal cues to stand more erect.    ADL:    Cognition: Cognition Overall Cognitive  Status: Within Functional Limits for tasks assessed Orientation Level: Oriented X4 Cognition Arousal/Alertness: Awake/alert Behavior During Therapy: WFL for tasks assessed/performed Overall Cognitive Status: Within Functional Limits for tasks assessed  Blood pressure 117/49, pulse 78, temperature 97.4 F (36.3 C), temperature source Oral, resp. rate 23, height 5\' 8"  (1.727 m), weight 67.4 kg (148 lb 9.4 oz), SpO2 97.00%. Physical Exam  Nursing note and vitals reviewed. Constitutional: He is oriented to person, place, and time. He appears well-developed and well-nourished.  HENT:  Head: Normocephalic and atraumatic.  Eyes: Conjunctivae are normal. Pupils are equal, round, and reactive to light.  Neck: Normal range of motion. Neck supple.  Cardiovascular: Normal rate and regular rhythm.   No murmur heard. Respiratory: Effort normal. No respiratory distress. He has wheezes. He exhibits tenderness.  Occasional pursed lip breathing with weak cough.  GI: Soft. Bowel sounds are normal. He exhibits no distension. There is no tenderness.  Musculoskeletal: He exhibits no edema.  Neurological: He is alert and oriented to person, place, and time.  Skin: Skin is warm and dry.  Sternal incision clean and dry--healing well.     Results for orders placed during the hospital encounter of 11/20/13 (from the past 24 hour(s))  GLUCOSE, CAPILLARY     Status: Abnormal   Collection Time    11/24/13 11:54 AM      Result Value Ref Range   Glucose-Capillary 139 (*) 70 - 99 mg/dL   Comment 1 Notify RN    GLUCOSE, CAPILLARY     Status: Abnormal   Collection Time    11/24/13  3:49 PM      Result Value Ref Range   Glucose-Capillary 141 (*) 70 - 99 mg/dL   Comment 1 Notify RN    GLUCOSE, CAPILLARY     Status: Abnormal   Collection Time    11/24/13  7:16 PM      Result Value Ref Range   Glucose-Capillary 111 (*) 70 - 99 mg/dL   Comment 1 Documented in Chart     Comment 2 Notify RN    GLUCOSE,  CAPILLARY     Status: Abnormal   Collection Time    11/24/13  9:33 PM      Result Value Ref Range   Glucose-Capillary 112 (*) 70 - 99 mg/dL  Comment 1 Documented in Chart     Comment 2 Notify RN    GLUCOSE, CAPILLARY     Status: None   Collection Time    11/24/13 11:19 PM      Result Value Ref Range   Glucose-Capillary 91  70 - 99 mg/dL   Comment 1 Documented in Chart     Comment 2 Notify RN    CBC     Status: Abnormal   Collection Time    11/25/13  5:30 AM      Result Value Ref Range   WBC 9.0  4.0 - 10.5 K/uL   RBC 3.04 (*) 4.22 - 5.81 MIL/uL   Hemoglobin 9.0 (*) 13.0 - 17.0 g/dL   HCT 27.6 (*) 39.0 - 52.0 %   MCV 90.8  78.0 - 100.0 fL   MCH 29.6  26.0 - 34.0 pg   MCHC 32.6  30.0 - 36.0 g/dL   RDW 15.7 (*) 11.5 - 15.5 %   Platelets 165  150 - 400 K/uL  BASIC METABOLIC PANEL     Status: Abnormal   Collection Time    11/25/13  5:30 AM      Result Value Ref Range   Sodium 148 (*) 137 - 147 mEq/L   Potassium 4.5  3.7 - 5.3 mEq/L   Chloride 108  96 - 112 mEq/L   CO2 28  19 - 32 mEq/L   Glucose, Bld 90  70 - 99 mg/dL   BUN 38 (*) 6 - 23 mg/dL   Creatinine, Ser 1.29  0.50 - 1.35 mg/dL   Calcium 9.6  8.4 - 10.5 mg/dL   GFR calc non Af Amer 50 (*) >90 mL/min   GFR calc Af Amer 57 (*) >90 mL/min   Anion gap 12  5 - 15   Dg Chest 2 View  11/25/2013   CLINICAL DATA:  Post CABG, shortness of breath, chest discomfort  EXAM: CHEST  2 VIEW  COMPARISON:  11/24/2013; 11/22/2013; 11/17/2013; Chest CT - 10/12/2013  FINDINGS: Grossly unchanged cardiac silhouette and mediastinal contours with atherosclerotic plaque within the thoracic aorta. Post median sternotomy. Improved aeration of the lungs with persistent ill-defined heterogeneous airspace opacities within the left mid lung. Grossly unchanged right apical pleural parenchymal thickening with associated mild right-sided volume loss. Interval removal of support apparatus. No pneumothorax. No change to minimal increase in trace bilateral  pleural effusions, right greater than left. No evidence of edema. Bilateral pleural calcifications re- demonstrated. Unchanged bones including mild to moderate compression deformity of a lower thoracic vertebral body.  IMPRESSION: 1. Interval removal of support apparatus.  No pneumothorax. 2. Overall improved aeration of the lungs with persistent left mid lung heterogeneous opacities worrisome for atelectasis, infection and/or contusion. Continued attention on follow-up is recommended. 3. Sequela of prior asbestos exposure as above.   Electronically Signed   By: Sandi Mariscal M.D.   On: 11/25/2013 07:52   Dg Chest Port 1 View  11/24/2013   CLINICAL DATA:  Post LEFT lung biopsy and CABG  EXAM: PORTABLE CHEST - 1 VIEW  COMPARISON:  Portable exam 7673 hr compared to 11/23/2013 and 11/22/2013  FINDINGS: RIGHT jugular central venous catheter with tip projecting over SVC.  LEFT basilar thoracostomy tube.  Enlargement of cardiac silhouette post CABG.  Pulmonary vascular congestion.  Bibasilar atelectasis.  Persistent RIGHT apical pleural thickening/scarring.  Small focus of LEFT upper lobe infiltrate persists.  Remaining lungs clear.  No gross pleural effusion or pneumothorax.  IMPRESSION:  Bibasilar atelectasis with small focus of persistent LEFT upper lobe infiltrate.  Enlargement of cardiac silhouette with pulmonary vascular congestion post CABG.   Electronically Signed   By: Lavonia Dana M.D.   On: 11/24/2013 09:51    Assessment/Plan: Diagnosis: deconditioning after CABG 11/20/13  1. Does the need for close, 24 hr/day medical supervision in concert with the patient's rehab needs make it unreasonable for this patient to be served in a less intensive setting? Potentially 2. Co-Morbidities requiring supervision/potential complications: newly diagnosed adenoca of lung, CAD,PVD 3. Due to bowel management, safety, skin/wound care, disease management, medication administration, pain management and patient education, does  the patient require 24 hr/day rehab nursing? Potentially 4. Does the patient require coordinated care of a physician, rehab nurse, PT (1-2 hrs/day, 5 days/week) and OT (1-2 hrs/day, 5 days/week) to address physical and functional deficits in the context of the above medical diagnosis(es)? Potentially Addressing deficits in the following areas: balance, endurance, locomotion, strength, transferring, bowel/bladder control, bathing, dressing, feeding, grooming and toileting 5. Can the patient actively participate in an intensive therapy program of at least 3 hrs of therapy per day at least 5 days per week? Yes 6. The potential for patient to make measurable gains while on inpatient rehab is good 7. Anticipated functional outcomes upon discharge from inpatient rehab are modified independent  with PT, modified independent with OT, n/a with SLP. 8. Estimated rehab length of stay to reach the above functional goals is: 7d 9. Does the patient have adequate social supports to accommodate these discharge functional goals? Potentially 10. Anticipated D/C setting: Home 11. Anticipated post D/C treatments: [facility] therapy 12. Overall Rehab/Functional Prognosis: good  RECOMMENDATIONS: This patient's condition is appropriate for continued rehabilitative care in the following setting: CIR if pt still at [facility] with PT today, otherwise SNF  Patient has agreed to participate in recommended program. Potentially Note that insurance prior authorization may be required for reimbursement for recommended care.  Comment:     11/25/2013

## 2013-11-25 NOTE — Progress Notes (Addendum)
[facility] DAILY ICU PROGRESS NOTE                   [address]            [place] [address]          [phone]   5 Days Post-Op Procedure(s) (LRB): INTRAOPERATIVE TRANSESOPHAGEAL ECHOCARDIOGRAM (N/A) OFF PUMP CORONARY ARTERY BYPASS GRAFTING (CABG) x2: LIMA to LAD, SVG to Obtuse Marginal 1. (N/A) BIOPSY Left Upper Lobe (Left)  Total Length of Stay:  LOS: 5 days   Subjective: OOB in chair.  Walked already in hall this am. No complaints.   Objective: Vital signs in last 24 hours: Temp:  [97.4 F (36.3 C)-98.9 F (37.2 C)] 98.9 F (37.2 C) (07/15 0813) Pulse Rate:  [63-78] 78 (07/15 0600) Cardiac Rhythm:  [-] Normal sinus rhythm (07/15 0400) Resp:  [19-30] 23 (07/15 0600) BP: (96-160)/(35-71) 117/49 mmHg (07/15 0600) SpO2:  [90 %-97 %] 97 % (07/15 0600) Weight:  [148 lb 9.4 oz (67.4 kg)] 148 lb 9.4 oz (67.4 kg) (07/15 0500)  Filed Weights   11/23/13 0448 11/24/13 0500 11/25/13 0500  Weight: 156 lb 4.9 oz (70.9 kg) 155 lb 3.3 oz (70.4 kg) 148 lb 9.4 oz (67.4 kg)    Weight change: -6 lb 9.8 oz (-3 kg)   Hemodynamic parameters for last 24 hours:    Intake/Output from previous day: 07/14 0701 - 07/15 0700 In: 240 [P.O.:240] Out: 2050 [Urine:1960; Chest Tube:90]  Intake/Output this shift:    Current Meds: Scheduled Meds: . acetaminophen  1,000 mg Oral 4 times per day   Or  . acetaminophen (TYLENOL) oral liquid 160 mg/5 mL  1,000 mg Per Tube 4 times per day  . aspirin EC  325 mg Oral Daily  . bisacodyl  10 mg Oral Daily   Or  . bisacodyl  10 mg Rectal Daily  . docusate sodium  200 mg Oral Daily  . enoxaparin (LOVENOX) injection  40 mg Subcutaneous Q24H  . feeding supplement (ENSURE COMPLETE)  237 mL Oral TID WC  . furosemide  40 mg Oral Daily  . guaiFENesin  600 mg Oral BID  . insulin aspart  0-24 Units Subcutaneous TID AC & HS  . ipratropium-albuterol  3 mL Nebulization TID  . levothyroxine  88 mcg Oral QAC breakfast  . lisinopril  5 mg Oral  Daily  . metoprolol tartrate  12.5 mg Oral BID   Or  . metoprolol tartrate  12.5 mg Per Tube BID  . pantoprazole  40 mg Oral Daily  . polyethylene glycol  17 g Oral Daily  . predniSONE  10 mg Oral Q breakfast  . sodium chloride  3 mL Intravenous Q12H   Continuous Infusions:  PRN Meds:.sodium chloride, albuterol, ALPRAZolam, dextromethorphan-guaiFENesin, magnesium hydroxide, metoprolol, morphine injection, ondansetron (ZOFRAN) IV, sodium chloride, sorbitol, traMADol   Physical Exam: General appearance: alert, cooperative and no distress Heart: regular rate and rhythm Lungs: clear to auscultation bilaterally Extremities: No significant LE edema Wound: Clean and dry  Lab Results: CBC: Recent Labs  11/24/13 0429 11/25/13 0530  WBC 9.4 9.0  HGB 8.3* 9.0*  HCT 25.5* 27.6*  PLT 132* 165   BMET:  Recent Labs  11/24/13 0429 11/25/13 0530  NA 146 148*  K 3.9 4.5  CL 108 108  CO2 27 28  GLUCOSE 95 90  BUN 37* 38*  CREATININE 1.36* 1.29  CALCIUM 9.2 9.6    PT/INR: No results found for this basename:  LABPROT, INR,  in the last 72 hours Radiology: Dg Chest 2 View  11/25/2013   CLINICAL DATA:  Post CABG, shortness of breath, chest discomfort  EXAM: CHEST  2 VIEW  COMPARISON:  11/24/2013; 11/22/2013; 11/17/2013; Chest CT - 10/12/2013  FINDINGS: Grossly unchanged cardiac silhouette and mediastinal contours with atherosclerotic plaque within the thoracic aorta. Post median sternotomy. Improved aeration of the lungs with persistent ill-defined heterogeneous airspace opacities within the left mid lung. Grossly unchanged right apical pleural parenchymal thickening with associated mild right-sided volume loss. Interval removal of support apparatus. No pneumothorax. No change to minimal increase in trace bilateral pleural effusions, right greater than left. No evidence of edema. Bilateral pleural calcifications re- demonstrated. Unchanged bones including mild to moderate compression deformity  of a lower thoracic vertebral body.  IMPRESSION: 1. Interval removal of support apparatus.  No pneumothorax. 2. Overall improved aeration of the lungs with persistent left mid lung heterogeneous opacities worrisome for atelectasis, infection and/or contusion. Continued attention on follow-up is recommended. 3. Sequela of prior asbestos exposure as above.   Electronically Signed   By: Sandi Mariscal M.D.   On: 11/25/2013 07:52   Dg Chest Port 1 View  11/24/2013   CLINICAL DATA:  Post LEFT lung biopsy and CABG  EXAM: PORTABLE CHEST - 1 VIEW  COMPARISON:  Portable exam 2423 hr compared to 11/23/2013 and 11/22/2013  FINDINGS: RIGHT jugular central venous catheter with tip projecting over SVC.  LEFT basilar thoracostomy tube.  Enlargement of cardiac silhouette post CABG.  Pulmonary vascular congestion.  Bibasilar atelectasis.  Persistent RIGHT apical pleural thickening/scarring.  Small focus of LEFT upper lobe infiltrate persists.  Remaining lungs clear.  No gross pleural effusion or pneumothorax.  IMPRESSION: Bibasilar atelectasis with small focus of persistent LEFT upper lobe infiltrate.  Enlargement of cardiac silhouette with pulmonary vascular congestion post CABG.   Electronically Signed   By: Lavonia Dana M.D.   On: 11/24/2013 09:51     Assessment/Plan: S/P Procedure(s) (LRB): INTRAOPERATIVE TRANSESOPHAGEAL ECHOCARDIOGRAM (N/A) OFF PUMP CORONARY ARTERY BYPASS GRAFTING (CABG) x2: LIMA to LAD, SVG to Obtuse Marginal 1. (N/A) BIOPSY Left Upper Lobe (Left) CV- SR, BPs stable. Will continue beta blocker, ACE-I. Pulm- continue home nebs, inhalers, etc. IS/FV/Mucinex. Expected postop blood loss anemia- H/H improving.   Vol overload- continue gentle diuresis.   Deconditioning- continue PT, ambulation.  Disp- SNF vs CIR at discharge. Tx stepdown today.    COLLINS,GINA H 11/25/2013 8:40 AM   Patient ready for stepdown- waiting for circle bed Will prob need CIR- consult placed Family has path report of  lung bx- SCC and will give to Lynelle Smoke-- SBRT planned patient examined and medical record reviewed,agree with above note. VAN TRIGT III,Aralynn Brake 11/25/2013

## 2013-11-25 NOTE — Progress Notes (Signed)
Physical Therapy Treatment Patient Details Name: Glenn Reilly MRN: [id number] DOB: [birth year] Today's Date: 11/25/2013    History of Present Illness Pt s/p CABG x 2 and biopsy lt lung lesion on 11/20/13. PMH - CVA, COPD, HTN    PT Comments    Pt with significant increase in ambulation tolerance this date. Pt remains to require minA for safe transfers due to minimal use of UEs. Pt does live alone and will need to mod I prior to safe transition home. Pt to benefit from ST-SNF upon d/c to achieve safe mod I function while adhering to precautions.   Follow Up Recommendations  SNF     Equipment Recommendations  Rolling walker with 5" wheels    Recommendations for Other Services       Precautions / Restrictions Precautions Precautions: Sternal;Fall Precaution Comments: pt able to recall "don't use my hands". v/c's to use pillow when coughing and to stand Restrictions Weight Bearing Restrictions: No Other Position/Activity Restrictions: limited use of Bilat UEs, lifting restriction    Mobility  Bed Mobility Overal bed mobility: Needs Assistance Bed Mobility: Rolling;Sidelying to Sit;Sit to Sidelying Rolling: Min guard Sidelying to sit: Min assist     Sit to sidelying: Min assist General bed mobility comments: minA for trunk elevation to come to sitting, minA for for LE management to return to supine. max directional vc for rolling and minimal use of UEs  Transfers Overall transfer level: Needs assistance Equipment used: Rolling walker (2 wheeled) Transfers: Sit to/from Stand Sit to Stand: Min guard         General transfer comment: v/c's to hug pillow and rely on LEs to stand up  Ambulation/Gait Ambulation/Gait assistance: Min guard Ambulation Distance (Feet): 250 Feet Assistive device: Rolling walker (2 wheeled) Gait Pattern/deviations: Step-through pattern     General Gait Details: pt with good walker managment but mildly unsteady requiring min guard, denies  chest discomfort or SOB   Stairs            Wheelchair Mobility    Modified Rankin (Stroke Patients Only)       Balance Overall balance assessment: Needs assistance         Standing balance support: Bilateral upper extremity supported Standing balance-Leahy Scale: Poor                      Cognition Arousal/Alertness: Awake/alert Behavior During Therapy: WFL for tasks assessed/performed Overall Cognitive Status: Within Functional Limits for tasks assessed                      Exercises      General Comments        Pertinent Vitals/Pain Mild surgical discomfort.    Home Living                      Prior Function            PT Goals (current goals can now be found in the care plan section) Progress towards PT goals: Progressing toward goals    Frequency  Min 3X/week    PT Plan Current plan remains appropriate    Co-evaluation             End of Session Equipment Utilized During Treatment: Gait belt Activity Tolerance: Patient tolerated treatment well Patient left: in bed;with call bell/phone within reach;with bed alarm set     Time: 1011-1030 PT Time Calculation (min): 19 min  Charges:  $Gait  Training: 8-22 mins                    G Codes:      Kingsley Callander 11/25/2013, 1:49 PM  Kittie Plater, PT, DPT Pager #: [phone] Office #: [phone]

## 2013-11-25 NOTE — Progress Notes (Signed)
Pt ambulated with RW and assist x1 approximately 550 ft in hall.  Pt with no c/o.  Pt steady on feet with a steady pace.  Pt back to bed with call bell within reach.  Will continue to monitor pt closely.

## 2013-11-25 NOTE — Progress Notes (Signed)
Pt received into room 2w22, pt assisted to sit in chair, pt states no pain at this time, tele placed on pt, vitals taken, pt oriented to room and call bell, will continue to monitor Rickard Rhymes, RN

## 2013-11-26 LAB — GLUCOSE, CAPILLARY
GLUCOSE-CAPILLARY: 106 mg/dL — AB (ref 70–99)
Glucose-Capillary: 100 mg/dL — ABNORMAL HIGH (ref 70–99)

## 2013-11-26 NOTE — Progress Notes (Signed)
Rehab admissions - Evaluated for possible admission.  I spoke with Dr. Letta Pate about patient's progress with therapy.  Dr. Letta Pate recommends SNF rather than inpatient rehab.  Patient is now doing too well to justify an acute inpatient rehab stay.  Call me for questions.  #[phone]

## 2013-11-26 NOTE — Progress Notes (Signed)
EPWs DC'd per order and unit protocol.  Pt tolerated very well, all tips intact, VSS see flow sheet.  CCMD notified, will monitor closely.  Pt understands bedrest for one hr with VS q32min.  Call bell in reach, will con't plan of care.

## 2013-11-26 NOTE — Discharge Instructions (Signed)
Coronary Artery Bypass Grafting, Care After Refer to this sheet in the next few weeks. These instructions provide you with information on caring for yourself after your procedure. Your health care provider may also give you more specific instructions. Your treatment has been planned according to current medical practices, but problems sometimes occur. Call your health care provider if you have any problems or questions after your procedure. WHAT TO EXPECT AFTER THE PROCEDURE Recovery from surgery will be different for everyone. Some people feel well after 3 or 4 weeks, while for others it takes longer. After your procedure, it is typical to have the following:  Nausea and a lack of appetite.   Constipation.  Weakness and fatigue.   Depression or irritability.   Pain or discomfort at your incision site. HOME CARE INSTRUCTIONS  Take all medicines as directed by your health care provider. Do not stop taking medicines or start any new medicines without first checking with your health care provider.  Take your pulse as directed by your health care provider.  Perform deep breathing as directed by your health care provider. If you were given a device called an incentive spirometer, use it to practice deep breathing several times a day. Support your chest with a pillow or your arms when you take deep breaths or cough.  Keep incision areas clean, dry, and protected. Remove or change any bandages (dressings) only as directed by your health care provider. You may have skin adhesive strips over the incision areas. Do not take the strips off. They will fall off on their own.  Check incision areas daily for any swelling, redness, or drainage.  If incisions were made in your legs, do the following:  Avoid crossing your legs.   Avoid sitting for long periods of time. Change positions every 30 minutes.   Elevate your legs when you are sitting.  Wear compression stockings as directed by your  health care provider. These stockings help keep blood clots from forming in your legs.  Take showers once your health care provider approves. Until then, only take sponge baths. Pat incisions dry. Do not rub incisions with a washcloth or towel. Do not bathe, swim, or use a hot tub until directed by your health care provider.  Eat foods that are high in fiber, such as raw fruits and vegetables, whole grains, beans, and nuts. Meats should be lean cut. Avoid canned, processed, and fried foods.  Drink enough fluids to keep your urine clear or pale yellow.  Weigh yourself every day. This helps identify if you are retaining fluid that may make your heart and lungs work harder.  Rest and limit activity as directed by your health care provider. You may be instructed to:  Stop any activity at once if you have chest pain, shortness of breath, irregular heartbeats, or dizziness. Get help right away if you have any of these symptoms.  Move around frequently for short periods or take short walks as directed by your health care provider. Increase your activities gradually. You may need physical therapy or cardiac rehabilitation to help strengthen your muscles and build your endurance.  Avoid lifting, pushing, or pulling anything heavier than 10 lb (4.5 kg) for at least 6 weeks after surgery.  Do not drive until your health care provider approves.  Ask your health care provider when you may return to work.  Ask your health care provider when you may resume sexual activity.  Follow up with your health care provider as directed. SEEK  MEDICAL CARE IF:  You have swelling, redness, increasing pain, or drainage at the site of an incision.  You have a fever.  You have swelling in your ankles or legs.  You have pain in your legs.   You gain 2 or more pounds (0.9 kg) a day.  You are nauseous or vomit.  You have diarrhea. SEEK IMMEDIATE MEDICAL CARE IF:  You have chest pain that goes to your jaw  or arms.  You have shortness of breath.   You have a fast or irregular heartbeat.   You notice a "clicking" in your breastbone (sternum) when you move.   You have numbness or weakness in your arms or legs.  You feel dizzy or light-headed.  MAKE SURE YOU:  Understand these instructions.  Will watch your condition.  Will get help right away if you are not doing well or get worse. Document Released: 11/17/2004 Document Revised: 05/05/2013 Document Reviewed: 10/07/2012 [facility] Patient Information 2015 [place], [place]. This information is not intended to replace advice given to you by your health care provider. Make sure you discuss any questions you have with your health care provider.  Endoscopic Saphenous Vein Harvesting Care After Refer to this sheet in the next few weeks. These instructions provide you with information on caring for yourself after your procedure. Your health care provider may also give you more specific instructions. Your treatment has been planned according to current medical practices, but problems sometimes occur. Call your health care provider if you have any problems or questions after your procedure. HOME CARE INSTRUCTIONS Medicine  Take whatever pain medicine your surgeon prescribes. Follow the directions carefully. Do not take over-the-counter pain medicine unless your surgeon says it is okay. Some pain medicine can cause bleeding problems for several weeks after surgery.  Follow your surgeon's instructions about driving. You will probably not be permitted to drive after heart surgery.  Take any medicines your surgeon prescribes. Any medicines you took before your heart surgery should be checked with your health care provider before you start taking them again. Wound care  If your surgeon has prescribed an elastic bandage or stocking, ask how long you should wear it.  Check the area around your surgical cuts (incisions) whenever your bandages  (dressings) are changed. Look for any redness or swelling.  You will need to return to have the stitches (sutures) or staples taken out. Ask your surgeon when to do that.  Ask your surgeon when you can shower or bathe. Activity  Try to keep your legs raised when you are sitting.  Do any exercises your health care providers have given you. These may include deep breathing exercises, coughing, walking, or other exercises. SEEK MEDICAL CARE IF:  You have any questions about your medicines.  You have more leg pain, especially if your pain medicine stops working.  New or growing bruises develop on your leg.  Your leg swells, feels tight, or becomes red.  You have numbness in your leg. SEEK IMMEDIATE MEDICAL CARE IF:  Your pain gets much worse.  Blood or fluid leaks from any of the incisions.  Your incisions become warm, swollen, or red.  You have chest pain.  You have trouble breathing.  You have a fever.  You have more pain near your leg incision. MAKE SURE YOU:  Understand these instructions.  Will watch your condition.  Will get help right away if you are not doing well or get worse. Document Released: 01/10/2011 Document Revised: 05/05/2013 Document Reviewed:  01/10/2011 [employer] Patient Information 2015 [place], [place]. This information is not intended to replace advice given to you by your health care provider. Make sure you discuss any questions you have with your health care provider.

## 2013-11-26 NOTE — Progress Notes (Addendum)
Update: CSW spoke to [facility] in [place] who states that they can take patient when medically ready. Facility stated that they take weekend admissions if needed. CSW updated patient and patient's son over the phone.  CSW spoke to patient and provided bed offers. Patient states he would like to go to [facility] in [place]. CSW explained that social worker will follow up with facility and see if they are able to extend a bed offer.  Jeanette Caprice, MSW, [place]

## 2013-11-26 NOTE — Discharge Summary (Signed)
Physician Discharge Summary  Patient ID: SINJIN AMERO MRN: [id number] DOB/AGE: [birth year] 80 y.o.  Admit date: 11/20/2013 Discharge date: 11/27/2013  Admission Diagnoses:  Patient Active Problem List   Diagnosis Date Noted  . CAD in native artery 11/10/2013  . Dysphagia as late effect of stroke   . Lung mass 10/19/2013  . Carotid artery disease 03/02/2013  . HYPERLIPIDEMIA-MIXED 01/13/2010  . HYPERTENSION, BENIGN 01/13/2010  . CAD, NATIVE VESSEL 01/13/2010  . PVD 01/13/2010  . COPD with emphysema 01/13/2010  . History of lung cancer - s/p right lower lobectomy 06/09/1999   Discharge Diagnoses:   Patient Active Problem List   Diagnosis Date Noted  . S/P Off-pump CABG x 2 11/20/2013  . CAD in native artery 11/10/2013  . Dysphagia as late effect of stroke   . Lung mass 10/19/2013  . Carotid artery disease 03/02/2013  . HYPERLIPIDEMIA-MIXED 01/13/2010  . HYPERTENSION, BENIGN 01/13/2010  . CAD, NATIVE VESSEL 01/13/2010  . PVD 01/13/2010  . COPD with emphysema 01/13/2010  . History of lung cancer - s/p right lower lobectomy 06/09/1999   Discharged Condition: good  History of Present Illness:   [name] is an 80 yo white male with known of coronary artery disease, hypertension, hyperlipidemia, cerebrovascular disease with previous stroke, PVD, COPD, remote history of tobacco abuse, remote history of lung cancer treated with right lower lobectomy, and recently discovered enlarging mass in the left upper lobe worrisome for new primary lung cancer who has been referred for possible surgical treatment of coronary artery disease. The patient's history of coronary artery disease dates back to 1999 when he first presented with symptoms of angina pectoris. He was treated with PCI and stenting of the right coronary artery by Dr. Olevia Perches. He was followed for years by Dr. Olevia Perches and has done well from a cardiac standpoint until recently. In October of 2014 the patient had a brief  episode of chest discomfort and was evaluated by Dr. Gwenlyn Found. Nuclear stress test was performed and felt to be low risk for ischemia. 2-D echocardiogram revealed normal left ventricular function without any segmental wall motion abnormalities. The patient was treated medically. Over the last month the patient has developed accelerating symptoms of dull pain in the left upper extremity associated with physical exertion and exertional shortness of breath. Symptoms were reportedly similar to that which occurred prior to stenting in the remote past. The patient was seen in follow up by Dr. Gwenlyn Found who felt symptoms were concerning for angina pectoris. Diagnostic cardiac catheterization was performed 11/05/2013 and notable for the presence of 50% stenosis of left main coronary artery with high grade ostial stenosis of left circumflex coronary artery, 75% proximal stenosis of left anterior descending coronary artery and long 50% restenosis of the right coronary artery. Left ventricular systolic function remains normal.  It was felt he would benefit from possible coronary bypass procedure and he was subsequently referred to [facility].  He was evaluated by Dr. Roxy Manns on 11/10/2013 at which time it was felt the patient would not be a candidate for PCI due to coronary anatomy.  It was also felt that due to patient's previous history he would not be a candidate for a lung resection on the left upper lobe lung nodule.  It was felt he would benefit from Coronary bypass procedure and he could undergo a punch biopsy of the left upper lobe nodule during the procedure for diagnostic purposes.  The patient was agreeable to this and the risks and benefits of the  procedure were explained to the patient.   Hospital Course:   The patient presented to [facility] on [facility]20[facility]2013.  He was taken to the operating room and underwent Off Pump CABG x 2 utilizing LIMA to LAD and SVG to OM1.  He underwent Tru-Cut Needle Biopsy of the Left Upper  Lobe. He also underwent endoscopic saphenous vein harvest from his right thigh.  He tolerated the procedure well and was taken to the SICU in stable condition.  During his stay in the SICU he was extubated on POD #1.  He was weaned off all cardiac drips as tolerated.  His chest tube exhibited a small air leak, felt to be due to needle biopsy.  His chest tubes were monitored and weaned off suction as tolerated.  These were later removed without difficulty.  Follow up CXR has been free from Pneumothorax.  He was started on diuretic for hypervolemia.  The patient was experiencing some dyspnea and cough, which was treated successfully with bronchodilator therapy.  He was also experiencing some dysphagia, however speech and swallow study was within normal limits.  The patient was ambulating around the unit with assistance.  He was felt medically stable for transfer to the step down unit.    The patient continues to progress.  He has been evaluated by PT/OT who recommend SNF placement for the patient and he is agreeable to this.  He is maintaining NSR and his pacing wires were removed without difficulty.  He has been weaned off oxygen, but continues to have a minor cough.  Pathology report from his Lung Biopsy confirms the presence of Squamous Cell Carcinoma.  His family has contacted his Oncologist for follow up.  He is ambulating with minimal assistance.  He is tolerating a heart health diet.  Should no issues arise we anticipate discharge to SNF on 11/27/2013.       Significant Diagnostic Studies: Cardiac Catheterization  HEMODYNAMICS:  AO SYSTOLIC/AO DIASTOLIC: 053/97  LV SYSTOLIC/LV DIASTOLIC: 673/41  ANGIOGRAPHIC RESULTS:  1. Left main; 50% diffuse damping using a 5 [place] catheter  2. LAD; 75% focal proximal fluoroscopic calcification  3. Left circumflex; 75-80% with ostial fluoroscopic calcification.  4. Right coronary artery; dominant with a long 50% segmental mid stenosis  5. Left ventriculography;  RAO left ventriculogram was performed using  25 mL of Visipaque dye at 12 mL/second. The overall LVEF estimated  70 % Without wall motion abnormalities  Pathology:   Diagnosis Lung, biopsy, Left upper lobe - SQUAMOUS CELL CARCINOMA, SEE COMMENT. Diagnosis Note Immunohistochemistry reveals the tumor is positive for cytokeratin 5/6 and p63. There is focal cytokeratin 7. The tumor is negative CD56, synaptophysin, chromogranin, TTF-1, and NapsinA. Dr. Saralyn Pilar has reviewed the case.  Treatments: surgery:   Off-pump Coronary Artery Bypass Grafting x 2  Left Internal Mammary Artery to Distal Left Anterior Descending Coronary Artery  Saphenous Vein Graft to First Obtuse Marginal Branch of Left Circumflex Coronary Artery  Endoscopic Vein Harvest from Right Thigh  Tru-cut Core Needle Biopsy of Left Upper Lobe Lung Nodule    Disposition: SNF  Discharge Medications:     Medication List    STOP taking these medications       amLODipine 2.5 MG tablet  Commonly known as:  NORVASC     aspirin 81 MG tablet  Replaced by:  aspirin 325 MG EC tablet     hydrALAZINE 25 MG tablet  Commonly known as:  APRESOLINE     levofloxacin 500 MG tablet  Commonly known as:  LEVAQUIN      TAKE these medications       albuterol 108 (90 BASE) MCG/ACT inhaler  Commonly known as:  PROVENTIL HFA;VENTOLIN HFA  Inhale 2 puffs into the lungs every 6 (six) hours as needed for wheezing or shortness of breath.     aspirin 325 MG EC tablet  Take 1 tablet (325 mg total) by mouth daily.     atorvastatin 20 MG tablet  Commonly known as:  LIPITOR  Take 1 tablet (20 mg total) by mouth daily.     benzonatate 200 MG capsule  Commonly known as:  TESSALON  Take 200 mg by mouth 3 (three) times daily as needed for cough.     furosemide 20 MG tablet  Commonly known as:  LASIX  Take 20 mg by mouth daily.     ipratropium-albuterol 0.5-2.5 (3) MG/3ML Soln  Commonly known as:  DUONEB  Take 3 mLs by nebulization 3  (three) times daily.     levothyroxine 88 MCG tablet  Commonly known as:  SYNTHROID, LEVOTHROID  Take 88 mcg by mouth daily before breakfast.     lisinopril 5 MG tablet  Commonly known as:  PRINIVIL,ZESTRIL  Take 1 tablet (5 mg total) by mouth daily.     metoprolol tartrate 25 MG tablet  Commonly known as:  LOPRESSOR  Take 0.5 tablets (12.5 mg total) by mouth 2 (two) times daily.     OVER THE COUNTER MEDICATION  Take 1 tablet by mouth daily. Mega Red Omega 3 capsule     predniSONE 10 MG tablet  Commonly known as:  DELTASONE  Take 10 mg by mouth daily.     traMADol 50 MG tablet  Commonly known as:  ULTRAM  Take 1 tablet (50 mg total) by mouth every 6 (six) hours as needed for moderate pain.       The patient has been discharged on:   1.Beta Blocker:  Yes [ x  ]                              No   [   ]                              If No, reason:  2.Ace Inhibitor/ARB: Yes [ x  ]                                     No  [    ]                                     If No, reason:  3.Statin:   Yes [ x  ]                  No  [   ]                  If No, reason:  4.Ecasa:  Yes  [ x  ]                  No   [   ]  If No, reason:          Follow-up Information   Follow up with Rexene Alberts, MD.   Specialty:  Cardiothoracic Surgery   Contact information:   [address] [place] [address] [phone]       Follow up with [facility].   Contact information:   [place]       Signed: Naisha Wisdom 11/26/2013, 9:12 AM

## 2013-11-26 NOTE — Progress Notes (Signed)
CARDIAC REHAB PHASE I   PRE:  Rate/Rhythm: 72 SR  BP:  Supine:   Sitting: 98/36  Standing:    SaO2: 94%RA  MODE:  Ambulation: 450 ft   POST:  Rate/Rhythm: 88 SR  BP:  Supine:   Sitting: 128/46  Standing:    SaO2: 98%RA took a little while to get to register [phone] Pt walked 450 ft on RA with rolling walker and minimal asst. Tolerated well. To recliner after walk. No complaints. Call bell in reach.   Graylon Good, RN BSN  11/26/2013 9:38 AM

## 2013-11-26 NOTE — Progress Notes (Addendum)
      [address]       [place] [address]             [phone]      6 Days Post-Op Procedure(s) (LRB): INTRAOPERATIVE TRANSESOPHAGEAL ECHOCARDIOGRAM (N/A) OFF PUMP CORONARY ARTERY BYPASS GRAFTING (CABG) x2: LIMA to LAD, SVG to Obtuse Marginal 1. (N/A) BIOPSY Left Upper Lobe (Left)  Subjective:  Glenn Reilly has no complaints this morning.  He states he is ready to get discharged.  He is ambulating independently around his room.  + BM  Objective: Vital signs in last 24 hours: Temp:  [98.3 F (36.8 C)-99 F (37.2 C)] 98.3 F (36.8 C) (07/16 0529) Pulse Rate:  [65-75] 68 (07/16 0708) Cardiac Rhythm:  [-] Normal sinus rhythm (07/15 1953) Resp:  [18-28] 20 (07/16 0708) BP: (107-149)/(42-69) 120/64 mmHg (07/16 0529) SpO2:  [92 %-96 %] 94 % (07/16 0708) Weight:  [148 lb 2.4 oz (67.2 kg)] 148 lb 2.4 oz (67.2 kg) (07/16 0529)  Intake/Output from previous day: 07/15 0701 - 07/16 0700 In: 800 [P.O.:800] Out: 250 [Urine:250]  General appearance: alert, cooperative and no distress Heart: regular rate and rhythm Lungs: clear to auscultation bilaterally Abdomen: soft, non-tender; bowel sounds normal; no masses,  no organomegaly Extremities: edema trace Wound: clean and dry  Lab Results:  Recent Labs  11/24/13 0429 11/25/13 0530  WBC 9.4 9.0  HGB 8.3* 9.0*  HCT 25.5* 27.6*  PLT 132* 165   BMET:  Recent Labs  11/24/13 0429 11/25/13 0530  NA 146 148*  K 3.9 4.5  CL 108 108  CO2 27 28  GLUCOSE 95 90  BUN 37* 38*  CREATININE 1.36* 1.29  CALCIUM 9.2 9.6    PT/INR: No results found for this basename: LABPROT, INR,  in the last 72 hours ABG    Component Value Date/Time   PHART 7.293* 11/21/2013 1102   HCO3 21.9 11/21/2013 1102   TCO2 21 11/21/2013 1805   ACIDBASEDEF 5.0* 11/21/2013 1102   O2SAT 99.0 11/21/2013 1102   CBG (last 3)   Recent Labs  11/25/13 1624 11/25/13 2028 11/26/13 0643  GLUCAP 133* 122* 100*    Assessment/Plan: S/P  Procedure(s) (LRB): INTRAOPERATIVE TRANSESOPHAGEAL ECHOCARDIOGRAM (N/A) OFF PUMP CORONARY ARTERY BYPASS GRAFTING (CABG) x2: LIMA to LAD, SVG to Obtuse Marginal 1. (N/A) BIOPSY Left Upper Lobe (Left)  1. CV- NSR rate and pressure controlled- continue Lopressor and Lisinopril 2. Pulm- no acute issues, off oxygen continue IS 3. Renal- creatinine stable, weight is at baseline on Lasix daily 4. Deconditioning- PT recs SNF, social work consult placed 5. Dispo- patient doing well, ambulating independently around room, will d/c EPW, plan for SNF in AM if remains stable   LOS: 6 days    BARRETT, ERIN 11/26/2013  I have seen and examined Glenn Reilly and agree with the above assessment  and plan.  Grace Isaac MD [place] [phone] Office [phone] 11/26/2013 8:54 AM

## 2013-11-26 NOTE — Progress Notes (Signed)
Pt ambulated 400 ft with RW on RA with no complaints of pain, dizziness, or SOB.  Slow steady gait, standby assist with minimal coaching.  To chair after walk with call bell in reach.  Will con't plan of care.

## 2013-11-27 DIAGNOSIS — I10 Essential (primary) hypertension: Secondary | ICD-10-CM | POA: Diagnosis not present

## 2013-11-27 DIAGNOSIS — I509 Heart failure, unspecified: Secondary | ICD-10-CM | POA: Diagnosis not present

## 2013-11-27 DIAGNOSIS — E785 Hyperlipidemia, unspecified: Secondary | ICD-10-CM | POA: Diagnosis not present

## 2013-11-27 DIAGNOSIS — C3490 Malignant neoplasm of unspecified part of unspecified bronchus or lung: Secondary | ICD-10-CM | POA: Diagnosis not present

## 2013-11-27 DIAGNOSIS — J180 Bronchopneumonia, unspecified organism: Secondary | ICD-10-CM | POA: Diagnosis not present

## 2013-11-27 DIAGNOSIS — I2510 Atherosclerotic heart disease of native coronary artery without angina pectoris: Secondary | ICD-10-CM | POA: Diagnosis not present

## 2013-11-27 DIAGNOSIS — Z8673 Personal history of transient ischemic attack (TIA), and cerebral infarction without residual deficits: Secondary | ICD-10-CM | POA: Diagnosis not present

## 2013-11-27 DIAGNOSIS — I130 Hypertensive heart and chronic kidney disease with heart failure and stage 1 through stage 4 chronic kidney disease, or unspecified chronic kidney disease: Secondary | ICD-10-CM | POA: Diagnosis not present

## 2013-11-27 DIAGNOSIS — E039 Hypothyroidism, unspecified: Secondary | ICD-10-CM | POA: Diagnosis not present

## 2013-11-27 DIAGNOSIS — J449 Chronic obstructive pulmonary disease, unspecified: Secondary | ICD-10-CM | POA: Diagnosis not present

## 2013-11-27 DIAGNOSIS — I7789 Other specified disorders of arteries and arterioles: Secondary | ICD-10-CM | POA: Diagnosis not present

## 2013-11-27 DIAGNOSIS — C3410 Malignant neoplasm of upper lobe, unspecified bronchus or lung: Secondary | ICD-10-CM | POA: Diagnosis not present

## 2013-11-27 DIAGNOSIS — R1310 Dysphagia, unspecified: Secondary | ICD-10-CM | POA: Diagnosis not present

## 2013-11-27 DIAGNOSIS — G8918 Other acute postprocedural pain: Secondary | ICD-10-CM | POA: Diagnosis not present

## 2013-11-27 MED ORDER — METOPROLOL TARTRATE 25 MG PO TABS: 12.5000 mg | ORAL_TABLET | Freq: Two times a day (BID) | ORAL | Status: DC

## 2013-11-27 MED ORDER — LISINOPRIL 5 MG PO TABS: 5.0000 mg | ORAL_TABLET | Freq: Every day | ORAL | Status: DC

## 2013-11-27 MED ORDER — ASPIRIN 325 MG PO TBEC: 325.0000 mg | DELAYED_RELEASE_TABLET | Freq: Every day | ORAL | Status: DC

## 2013-11-27 MED ORDER — TRAMADOL HCL 50 MG PO TABS: 50.0000 mg | ORAL_TABLET | Freq: Four times a day (QID) | ORAL | Status: DC | PRN

## 2013-11-27 MED ORDER — ATORVASTATIN CALCIUM 20 MG PO TABS: 20.0000 mg | ORAL_TABLET | Freq: Every day | ORAL | Status: DC

## 2013-11-27 NOTE — Progress Notes (Signed)
0955-1026 Education completed with pt who voiced understanding. Encouraged IS and flutter valve. Discussed CRP 2 and pt gave permission given to refer to [facility] program. Discussed heart healthy choices. Graylon Good RN BSN 11/27/2013 10:25 AM

## 2013-11-27 NOTE — Progress Notes (Signed)
      [address]       ,[place] [address]             [phone]      7 Days Post-Op Procedure(s) (LRB): INTRAOPERATIVE TRANSESOPHAGEAL ECHOCARDIOGRAM (N/A) OFF PUMP CORONARY ARTERY BYPASS GRAFTING (CABG) x2: LIMA to LAD, SVG to Obtuse Marginal 1. (N/A) BIOPSY Left Upper Lobe (Left)  Subjective:  [name] has no complaints this morning.  He states that he feels great.  + ambulation  + BM  Objective: Vital signs in last 24 hours: Temp:  [98.5 F (36.9 C)-99.1 F (37.3 C)] 99.1 F (37.3 C) (07/17 0509) Pulse Rate:  [60-75] 68 (07/17 0509) Cardiac Rhythm:  [-] Normal sinus rhythm (07/16 2025) Resp:  [18-20] 18 (07/17 0509) BP: (123-151)/(37-57) 123/37 mmHg (07/17 0509) SpO2:  [93 %-97 %] 93 % (07/17 0509) Weight:  [146 lb 9.6 oz (66.497 kg)] 146 lb 9.6 oz (66.497 kg) (07/17 0509)  Intake/Output from previous day: 07/16 0701 - 07/17 0700 In: 360 [P.O.:360] Out: -   General appearance: alert, cooperative and no distress Heart: regular rate and rhythm Lungs: clear to auscultation bilaterally and coarse, clears with cough Abdomen: soft, non-tender; bowel sounds normal; no masses,  no organomegaly Extremities: edema none appreciated Wound: clean and dry  Lab Results:  Recent Labs  11/25/13 0530  WBC 9.0  HGB 9.0*  HCT 27.6*  PLT 165   BMET:  Recent Labs  11/25/13 0530  NA 148*  K 4.5  CL 108  CO2 28  GLUCOSE 90  BUN 38*  CREATININE 1.29  CALCIUM 9.6    PT/INR: No results found for this basename: LABPROT, INR,  in the last 72 hours ABG    Component Value Date/Time   PHART 7.293* 11/21/2013 1102   HCO3 21.9 11/21/2013 1102   TCO2 21 11/21/2013 1805   ACIDBASEDEF 5.0* 11/21/2013 1102   O2SAT 99.0 11/21/2013 1102   CBG (last 3)   Recent Labs  11/25/13 2028 11/26/13 0643 11/26/13 1122  GLUCAP 122* 100* 106*    Assessment/Plan: S/P Procedure(s) (LRB): INTRAOPERATIVE TRANSESOPHAGEAL ECHOCARDIOGRAM (N/A) OFF PUMP CORONARY  ARTERY BYPASS GRAFTING (CABG) x2: LIMA to LAD, SVG to Obtuse Marginal 1. (N/A) BIOPSY Left Upper Lobe (Left)  1. CV- NSR good rate and pressure control- continue Lopressor, Lisinopril 2. Pulm- no acute issues, severe COPD, encouraged patient to continue to use flutter valve/IS at discharge 3. Renal- weight remains below baseline, will decrease Lasix dose and taper at discharge 4. Deconditioning- continue PT/OT 5. Dispo- patient doing well, will d/c to SNF today  LOS: 7 days    Ahmed Prima, Junie Panning 11/27/2013

## 2013-11-27 NOTE — Progress Notes (Signed)
CT sutures removed and steri strips applied.  Pt excited for DC today.

## 2013-11-27 NOTE — Progress Notes (Signed)
Clinical Social Work Department CLINICAL SOCIAL WORK PLACEMENT NOTE 11/27/2013  Patient:  EBRIMA, RANTA  Account Number:  [id number] [place] date:  11/20/2013  Clinical Social Worker:  Megan Salon  Date/time:  11/24/2013 01:24 PM  Clinical Social Work is seeking post-discharge placement for this patient at the following level of care:   [place]   (*CSW will update this form in Epic as items are completed)   11/24/2013  Patient/family provided with [place] Department of Clinical Social Work's list of facilities offering this level of care within the geographic area requested by the patient (or if unable, by the patient's family).  11/24/2013  Patient/family informed of their freedom to choose among providers that offer the needed level of care, that participate in Medicare, Medicaid or managed care program needed by the patient, have an available bed and are willing to accept the patient.  11/24/2013  Patient/family informed of [facility]' ownership interest in [facility], as well as of the fact that they are under no obligation to receive care at this facility.  PASARR submitted to EDS on  PASARR number received on   FL2 transmitted to all facilities in geographic area requested by pt/family on  11/24/2013 FL2 transmitted to all facilities within larger geographic area on   Patient informed that his/her managed care company has contracts with or will negotiate with  certain facilities, including the following:     Patient/family informed of bed offers received:  11/26/2013 Patient chooses bed at Other Physician recommends and patient chooses bed at    Patient to be transferred to Other on  11/27/2013 Patient to be transferred to facility by FAMILY Patient and family notified of transfer on 11/27/2013 Name of family member notified:  SON  The following physician request were entered in Epic:   Additional Comments: [facility] in  [place], MSW, [place]

## 2013-11-27 NOTE — Progress Notes (Signed)
Clinical Social Worker facilitated patient discharge including contacting patient, family and facility to confirm patient discharge plans.  Clinical information faxed to facility and patient agreeable with plan.  Patient's son states he is going to bring patient to [facility] in [place]. RN to call report prior to discharge.  Clinical Social Worker will sign off for now as social work intervention is no longer needed. Please consult [place] again if new need arises.  Jeanette Caprice, MSW, [place]

## 2013-11-27 NOTE — Progress Notes (Signed)
Physical Therapy Treatment Patient Details Name: CORAN DIPAOLA MRN: [id number] DOB: 02-May-1929 Today's Date: 12/13/2013    History of Present Illness Pt s/p CABG x 2 and biopsy lt lung lesion on 11/20/13. PMH - CVA, COPD, HTN    PT Comments    Pt making steady progress.  Follow Up Recommendations  SNF     Equipment Recommendations  Rolling walker with 5" wheels    Recommendations for Other Services       Precautions / Restrictions Precautions Precautions: Sternal;Fall Restrictions Weight Bearing Restrictions: No    Mobility  Bed Mobility Overal bed mobility: Modified Independent Bed Mobility: Supine to Sit     Supine to sit: Modified independent (Device/Increase time);HOB elevated        Transfers Overall transfer level: Needs assistance Equipment used: Rolling walker (2 wheeled) Transfers: Sit to/from Stand Sit to Stand: Supervision         General transfer comment: Verbal cues for hand placement  Ambulation/Gait Ambulation/Gait assistance: Min guard Ambulation Distance (Feet): 150 Feet Assistive device: Rolling walker (2 wheeled);None Gait Pattern/deviations: Step-through pattern;Decreased stride length;Trunk flexed Gait velocity: decr Gait velocity interpretation: Below normal speed for age/gender General Gait Details: Verbal cues to stand more erect. Amb short distance without walker.   Stairs            Wheelchair Mobility    Modified Rankin (Stroke Patients Only)       Balance           Standing balance support: No upper extremity supported Standing balance-Leahy Scale: Fair                      Cognition Arousal/Alertness: Awake/alert Behavior During Therapy: WFL for tasks assessed/performed Overall Cognitive Status: Within Functional Limits for tasks assessed                      Exercises      General Comments        Pertinent Vitals/Pain VSS    Home Living                       Prior Function            PT Goals (current goals can now be found in the care plan section) Progress towards PT goals: Progressing toward goals    Frequency  Min 3X/week    PT Plan Current plan remains appropriate    Co-evaluation             End of Session   Activity Tolerance: Patient tolerated treatment well Patient left: in chair;with call bell/phone within reach;with nursing/sitter in room     Time: 3833-3832 PT Time Calculation (min): 9 min  Charges:  $Gait Training: 8-22 mins                    G Codes:      Kumar Falwell 12/13/2013, 9:24 AM  Suanne Marker PT [phone]

## 2013-11-28 DIAGNOSIS — I130 Hypertensive heart and chronic kidney disease with heart failure and stage 1 through stage 4 chronic kidney disease, or unspecified chronic kidney disease: Secondary | ICD-10-CM | POA: Diagnosis not present

## 2013-11-28 DIAGNOSIS — C3490 Malignant neoplasm of unspecified part of unspecified bronchus or lung: Secondary | ICD-10-CM | POA: Diagnosis not present

## 2013-11-28 DIAGNOSIS — J180 Bronchopneumonia, unspecified organism: Secondary | ICD-10-CM | POA: Diagnosis not present

## 2013-11-28 DIAGNOSIS — I2510 Atherosclerotic heart disease of native coronary artery without angina pectoris: Secondary | ICD-10-CM | POA: Diagnosis not present

## 2013-12-07 DIAGNOSIS — R262 Difficulty in walking, not elsewhere classified: Secondary | ICD-10-CM | POA: Diagnosis not present

## 2013-12-07 DIAGNOSIS — I10 Essential (primary) hypertension: Secondary | ICD-10-CM | POA: Diagnosis not present

## 2013-12-07 DIAGNOSIS — C3410 Malignant neoplasm of upper lobe, unspecified bronchus or lung: Secondary | ICD-10-CM | POA: Diagnosis not present

## 2013-12-07 DIAGNOSIS — Z5189 Encounter for other specified aftercare: Secondary | ICD-10-CM | POA: Diagnosis not present

## 2013-12-07 DIAGNOSIS — J449 Chronic obstructive pulmonary disease, unspecified: Secondary | ICD-10-CM | POA: Diagnosis not present

## 2013-12-07 DIAGNOSIS — M6281 Muscle weakness (generalized): Secondary | ICD-10-CM | POA: Diagnosis not present

## 2013-12-08 DIAGNOSIS — R262 Difficulty in walking, not elsewhere classified: Secondary | ICD-10-CM | POA: Diagnosis not present

## 2013-12-08 DIAGNOSIS — M6281 Muscle weakness (generalized): Secondary | ICD-10-CM | POA: Diagnosis not present

## 2013-12-08 DIAGNOSIS — I10 Essential (primary) hypertension: Secondary | ICD-10-CM | POA: Diagnosis not present

## 2013-12-08 DIAGNOSIS — Z5189 Encounter for other specified aftercare: Secondary | ICD-10-CM | POA: Diagnosis not present

## 2013-12-08 DIAGNOSIS — J449 Chronic obstructive pulmonary disease, unspecified: Secondary | ICD-10-CM | POA: Diagnosis not present

## 2013-12-09 ENCOUNTER — Encounter (HOSPITAL_COMMUNITY): Payer: Self-pay | Admitting: Thoracic Surgery (Cardiothoracic Vascular Surgery)

## 2013-12-09 DIAGNOSIS — R262 Difficulty in walking, not elsewhere classified: Secondary | ICD-10-CM | POA: Diagnosis not present

## 2013-12-09 DIAGNOSIS — J449 Chronic obstructive pulmonary disease, unspecified: Secondary | ICD-10-CM | POA: Diagnosis not present

## 2013-12-09 DIAGNOSIS — M6281 Muscle weakness (generalized): Secondary | ICD-10-CM | POA: Diagnosis not present

## 2013-12-09 DIAGNOSIS — I10 Essential (primary) hypertension: Secondary | ICD-10-CM | POA: Diagnosis not present

## 2013-12-09 DIAGNOSIS — Z5189 Encounter for other specified aftercare: Secondary | ICD-10-CM | POA: Diagnosis not present

## 2013-12-10 NOTE — Progress Notes (Signed)
Thoracic Location of Tumor / Histology:Left upper lobe lung mass   Patient presented  History of right lower lobectomy by Dr.Burney in 2001.  Biopsies of 11/20/2013 Diagnosis Lung, biopsy, Left upper lobe - SQUAMOUS CELL CARCINOMA, SEE COMMENT.  Tobacco/Marijuana/Snuff/ETOH HRC:BULA smoking in 1980, smoked cigareetes 1 ppd and did chew tobaccos as well.No ETOH of illicit drugs.  Past/Anticipated interventions by cardiothoracic surgery, if any: 11/20/2013 INTRAOPERATIVE TRANSESOPHAGEAL ECHOCARDIOGRAM OFF PUMP CORONARY ARTERY BYPASS GRAFTING (CABG) BIOPSY by Dr.Clarenc Roxy Manns.   Past/Anticipated interventions by medical oncology, if any: No chemotherapy.Followed by Dr.Christine Hinton Rao  Signs/Symptoms  Weight changes, if any: 10 lbs in last 3 week of surgery.Good appetite.  Respiratory complaints, if GTX:MIWOEHOZY of breath on exertion.no coughing now.  Hemoptysis, if any:No Pain issues, if any:No   SAFETY ISSUES:  Prior radiation?No   Pacemaker/ICD? No  Possible current pregnancy?N/A  Is the patient on methotrexate? NO  Current Complaints / other details:Lives alone in [place] but son lives next door.[facility] for to help with deconditioning for 8 days but is now back home.Marland KitchenHistory of cabg x2, cva, hypertension and copd. Allergies:demerol Patient was asymptomatic.

## 2013-12-11 DIAGNOSIS — R262 Difficulty in walking, not elsewhere classified: Secondary | ICD-10-CM | POA: Diagnosis not present

## 2013-12-11 DIAGNOSIS — Z5189 Encounter for other specified aftercare: Secondary | ICD-10-CM | POA: Diagnosis not present

## 2013-12-11 DIAGNOSIS — J449 Chronic obstructive pulmonary disease, unspecified: Secondary | ICD-10-CM | POA: Diagnosis not present

## 2013-12-11 DIAGNOSIS — M6281 Muscle weakness (generalized): Secondary | ICD-10-CM | POA: Diagnosis not present

## 2013-12-11 DIAGNOSIS — I10 Essential (primary) hypertension: Secondary | ICD-10-CM | POA: Diagnosis not present

## 2013-12-14 DIAGNOSIS — I10 Essential (primary) hypertension: Secondary | ICD-10-CM | POA: Diagnosis not present

## 2013-12-14 DIAGNOSIS — J449 Chronic obstructive pulmonary disease, unspecified: Secondary | ICD-10-CM | POA: Diagnosis not present

## 2013-12-14 DIAGNOSIS — R262 Difficulty in walking, not elsewhere classified: Secondary | ICD-10-CM | POA: Diagnosis not present

## 2013-12-14 DIAGNOSIS — M6281 Muscle weakness (generalized): Secondary | ICD-10-CM | POA: Diagnosis not present

## 2013-12-14 DIAGNOSIS — Z5189 Encounter for other specified aftercare: Secondary | ICD-10-CM | POA: Diagnosis not present

## 2013-12-15 ENCOUNTER — Encounter: Payer: Self-pay | Admitting: Critical Care Medicine

## 2013-12-15 ENCOUNTER — Ambulatory Visit (INDEPENDENT_AMBULATORY_CARE_PROVIDER_SITE_OTHER): Payer: Medicare Other | Admitting: Critical Care Medicine

## 2013-12-15 VITALS — BP 156/70 | HR 74 | Temp 98.4°F | Ht 68.0 in | Wt 144.8 lb

## 2013-12-15 DIAGNOSIS — C3410 Malignant neoplasm of upper lobe, unspecified bronchus or lung: Secondary | ICD-10-CM | POA: Diagnosis not present

## 2013-12-15 DIAGNOSIS — M6281 Muscle weakness (generalized): Secondary | ICD-10-CM | POA: Diagnosis not present

## 2013-12-15 DIAGNOSIS — J438 Other emphysema: Secondary | ICD-10-CM | POA: Diagnosis not present

## 2013-12-15 DIAGNOSIS — Z85118 Personal history of other malignant neoplasm of bronchus and lung: Secondary | ICD-10-CM

## 2013-12-15 DIAGNOSIS — Z5189 Encounter for other specified aftercare: Secondary | ICD-10-CM | POA: Diagnosis not present

## 2013-12-15 DIAGNOSIS — R262 Difficulty in walking, not elsewhere classified: Secondary | ICD-10-CM | POA: Diagnosis not present

## 2013-12-15 DIAGNOSIS — J4489 Other specified chronic obstructive pulmonary disease: Secondary | ICD-10-CM | POA: Diagnosis not present

## 2013-12-15 DIAGNOSIS — C3492 Malignant neoplasm of unspecified part of left bronchus or lung: Secondary | ICD-10-CM

## 2013-12-15 DIAGNOSIS — J432 Centrilobular emphysema: Secondary | ICD-10-CM

## 2013-12-15 DIAGNOSIS — J449 Chronic obstructive pulmonary disease, unspecified: Secondary | ICD-10-CM | POA: Diagnosis not present

## 2013-12-15 DIAGNOSIS — I10 Essential (primary) hypertension: Secondary | ICD-10-CM | POA: Diagnosis not present

## 2013-12-15 NOTE — Assessment & Plan Note (Signed)
Left upper lobe squamous cell carcinoma lung stage II Plan Referral for primary radiation has been obtained note PET scan did not show evidence of other metastatic process

## 2013-12-15 NOTE — Progress Notes (Signed)
Subjective:    [name], male    DOB: 06/06/1928, 80 y.o.   MRN: [id number]  HPI 12/15/2013 Chief Complaint  Patient presents with  . Follow-up    had open hrt. surg. 49/10 [facility] punch bx done on lung then,sob-same, no cough,denies cp or tightness, no wheeze   this patient had recent off-pump bypass surgery of 2 vessels and associated with this a biopsy of the left upper lobe lung nodule was obtained intraoperative. Biopsy did show squamous cell carcinoma. The patient now has pending appointments with radiation and medical oncology. The patient's level of dyspnea is stable. There is minimal cough. There is no hemoptysis. There is improvement in overall breathing. Pt denies any significant sore throat, nasal congestion or excess secretions, fever, chills, sweats, unintended weight loss, pleurtic or exertional chest pain, orthopnea PND, or leg swelling Pt denies any increase in rescue therapy over baseline, denies waking up needing it or having any early am or nocturnal exacerbations of coughing/wheezing/or dyspnea. Pt also denies any obvious fluctuation in symptoms with  weather or environmental change or other alleviating or aggravating factors    Review of Systems Constitutional:   No  weight loss, night sweats,  Fevers, chills, fatigue, lassitude. HEENT:   No headaches,  Difficulty swallowing,  Tooth/dental problems,  Sore throat,                No sneezing, itching, ear ache, nasal congestion, post nasal drip,   CV:  No chest pain,  Orthopnea, PND, swelling in lower extremities, anasarca, dizziness, palpitations  GI  No heartburn, indigestion, abdominal pain, nausea, vomiting, diarrhea, change in bowel habits, loss of appetite  Resp: Notes shortness of breath with exertion not  at rest.  No excess mucus, no productive cough,  No non-productive cough,  No coughing up of blood.  No change in color of mucus.  No wheezing.  No chest wall deformity  Skin: no rash or  lesions.  GU: no dysuria, change in color of urine, no urgency or frequency.  No flank pain.  MS:  No joint pain or swelling.  No decreased range of motion.  No back pain.  Psych:  No change in mood or affect. No depression or anxiety.  No memory loss.     Objective:   Physical Exam Filed Vitals:   12/15/13 0849  BP: 156/70  Pulse: 74  Temp: 98.4 F (36.9 C)  TempSrc: Oral  Height: 5\' 8"  (1.727 m)  Weight: 144 lb 12.8 oz (65.681 kg)  SpO2: 98%    Gen: Pleasant, well-nourished, in no distress,  normal affect  ENT: No lesions,  mouth clear,  oropharynx clear, no postnasal drip  Neck: No JVD, no TMG, no carotid bruits  Lungs: No use of accessory muscles, no dullness to percussion, distant breath sounds Median sternotomy is healing  Cardiovascular: RRR, heart sounds normal, no murmur or gallops, no peripheral edema  Abdomen: soft and NT, no HSM,  BS normal  Musculoskeletal: No deformities, no cyanosis or clubbing  Neuro: alert, non focal  Skin: Warm, no lesions or rashes  No results found.  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 albuterol (PROVENTIL HFA;VENTOLIN HFA) 108 (90 BASE) MCG/ACT inhaler Inhale 2 puffs into the lungs every 6 (six) hours as needed for wheezing or shortness of breath.  Marland Kitchen aspirin EC 325 MG EC tablet Take 1 tablet (325 mg total) by mouth daily.  Marland Kitchen atorvastatin (LIPITOR) 20 MG tablet Take 1 tablet (20 mg total) by mouth daily.  . Fluticasone-Salmeterol (ADVAIR) 250-50 MCG/DOSE AEPB Inhale 1 puff into the lungs 2 (two) times daily.  . furosemide (LASIX) 20 MG tablet Take 20 mg by mouth daily.  Marland Kitchen ipratropium-albuterol (DUONEB) 0.5-2.5 (3) MG/3ML SOLN Take 3 mLs by nebulization 3 (three) times daily.  Marland Kitchen levothyroxine (SYNTHROID, LEVOTHROID) 88 MCG tablet Take 88 mcg by mouth daily before breakfast.  . lisinopril (PRINIVIL,ZESTRIL) 5 MG tablet Take 1 tablet (5 mg total) by mouth daily.  . metoprolol tartrate (LOPRESSOR) 25 MG tablet Take 0.5 tablets (12.5 mg total) by mouth 2 (two) times daily.  . predniSONE (DELTASONE) 10 MG tablet Take 10 mg by mouth daily.  . benzonatate (TESSALON) 200 MG capsule Take 200 mg by mouth 3 (three) times daily as needed for cough.  Marland Kitchen OVER THE COUNTER MEDICATION Take 1 tablet by mouth daily. Mega Red Omega 3 capsule  . traMADol (ULTRAM) 50 MG tablet Take 1 tablet (50 mg total) by mouth every 6 (six) hours as needed for moderate pain.  . zaleplon (SONATA) 10 MG capsule Take 10 mg by mouth at bedtime as needed for sleep.

## 2013-12-15 NOTE — Assessment & Plan Note (Signed)
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

## 2013-12-15 NOTE — Assessment & Plan Note (Signed)
COPD with primary emphysematous component with severe airway obstruction Recent diagnosis left upper lobe squamous cell carcinoma of lung stage II Plan Maintain inhaled medications as prescribed Patient has been referred to radiation therapy for primary treatment

## 2013-12-15 NOTE — Patient Instructions (Signed)
No change in medications  Return 6 weeks

## 2013-12-16 ENCOUNTER — Ambulatory Visit
Admission: RE | Admit: 2013-12-16 | Discharge: 2013-12-16 | Disposition: A | Discharge status: Home / Self Care | Payer: Medicare Other | Source: Ambulatory Visit | Attending: Radiation Oncology | Admitting: Radiation Oncology

## 2013-12-16 ENCOUNTER — Encounter: Payer: Self-pay | Admitting: Radiation Oncology

## 2013-12-16 VITALS — BP 144/55 | HR 66 | Temp 97.7°F | Resp 20 | Wt 144.8 lb

## 2013-12-16 DIAGNOSIS — Z8673 Personal history of transient ischemic attack (TIA), and cerebral infarction without residual deficits: Secondary | ICD-10-CM | POA: Diagnosis not present

## 2013-12-16 DIAGNOSIS — Z510 Encounter for antineoplastic radiation therapy: Secondary | ICD-10-CM | POA: Diagnosis not present

## 2013-12-16 DIAGNOSIS — E039 Hypothyroidism, unspecified: Secondary | ICD-10-CM | POA: Diagnosis not present

## 2013-12-16 DIAGNOSIS — J4489 Other specified chronic obstructive pulmonary disease: Secondary | ICD-10-CM | POA: Diagnosis not present

## 2013-12-16 DIAGNOSIS — C3410 Malignant neoplasm of upper lobe, unspecified bronchus or lung: Secondary | ICD-10-CM | POA: Diagnosis not present

## 2013-12-16 DIAGNOSIS — E782 Mixed hyperlipidemia: Secondary | ICD-10-CM | POA: Diagnosis not present

## 2013-12-16 DIAGNOSIS — Z85828 Personal history of other malignant neoplasm of skin: Secondary | ICD-10-CM | POA: Diagnosis not present

## 2013-12-16 DIAGNOSIS — I739 Peripheral vascular disease, unspecified: Secondary | ICD-10-CM | POA: Diagnosis not present

## 2013-12-16 DIAGNOSIS — J449 Chronic obstructive pulmonary disease, unspecified: Secondary | ICD-10-CM | POA: Diagnosis not present

## 2013-12-16 DIAGNOSIS — J61 Pneumoconiosis due to asbestos and other mineral fibers: Secondary | ICD-10-CM | POA: Diagnosis not present

## 2013-12-16 DIAGNOSIS — I2510 Atherosclerotic heart disease of native coronary artery without angina pectoris: Secondary | ICD-10-CM | POA: Diagnosis not present

## 2013-12-16 DIAGNOSIS — Z7982 Long term (current) use of aspirin: Secondary | ICD-10-CM | POA: Diagnosis not present

## 2013-12-16 DIAGNOSIS — I10 Essential (primary) hypertension: Secondary | ICD-10-CM | POA: Diagnosis not present

## 2013-12-16 DIAGNOSIS — D6859 Other primary thrombophilia: Secondary | ICD-10-CM | POA: Diagnosis not present

## 2013-12-16 DIAGNOSIS — Z951 Presence of aortocoronary bypass graft: Secondary | ICD-10-CM | POA: Diagnosis not present

## 2013-12-16 DIAGNOSIS — Z87891 Personal history of nicotine dependence: Secondary | ICD-10-CM | POA: Diagnosis not present

## 2013-12-16 DIAGNOSIS — Z902 Acquired absence of lung [part of]: Secondary | ICD-10-CM | POA: Diagnosis not present

## 2013-12-16 DIAGNOSIS — Z85118 Personal history of other malignant neoplasm of bronchus and lung: Secondary | ICD-10-CM | POA: Diagnosis not present

## 2013-12-16 NOTE — Addendum Note (Signed)
Encounter addended by: Thea Silversmith, MD on: 12/16/2013  8:07 PM<BR>     Documentation filed: Notes Section

## 2013-12-16 NOTE — Progress Notes (Addendum)
Radiation Oncology         [phone]) [phone] ________________________________  Initial outpatient Consultation - Date: 12/16/2013   Name: Glenn Reilly MRN: [id number]   DOB: 13-Jun-1928  REFERRING PHYSICIAN: Townsend Roger, MD  DIAGNOSIS:    ICD-9-CM  1. Malignant neoplasm of upper lobe, bronchus or lung 162.3    STAGE I   HISTORY OF PRESENT ILLNESS::Glenn Reilly is a 80 y.o. male  Who was diagnosed with a lung cancer in 2001 which was treated with a right lower lobectomy by Dr. Arlyce Dice. No adjuvant treatment was recommended. He was found to have a left upper lobe lung nodule on a chest xray. He was in the process of this work up when he was found to have significant coronary disease and underwent CABG on 11/20/13. Dr. Ricard Dillon was able to plapate this mass and biopsy it. This revealed squamous cell carcinoma. He is not a surgical candidate given his age and recent CABG. He was referred to me for consideration of radiation in the management of his disease. He is walking more and working with PT. He is starting back on pulmonary rehab. He would like to be more active in the yard and is waiting for clearance from Dr. Roxy Manns.  He is accompanied by his daughter.  He oncologist is Dr. Hinton Rao in [place]. He was referred to me for consideration of radiation (specifically SBRT) in the management of his disease. He has no hemoptysis or weight loss. No headaches or focal numbnes or weakness to suggest brain metastases.  PREVIOUS RADIATION THERAPY: No  PAST MEDICAL HISTORY:  has a past medical history of PVD (peripheral vascular disease) (2011); Stroke (07/2009); Lung cancer (2001); HTN (hypertension); Hyperlipemia; Carotid artery disease; Detached retina; Cataract; COPD (chronic obstructive pulmonary disease); Asbestosis; CAD, NATIVE VESSEL (1999); History of lung cancer - s/p right lower lobectomy (06/09/1999); Lung mass (10/19/2013); HYPERLIPIDEMIA-MIXED (01/13/2010); COPD with emphysema (01/13/2010); PVD  (01/13/2010); HYPERTENSION, BENIGN (01/13/2010); Asthma; Hypothyroidism; Factor 5 Leiden mutation, heterozygous; Complication of anesthesia; Dysphagia as late effect of stroke; and S/P CABG x 2 (11/20/2013).    PAST SURGICAL HISTORY: Past Surgical History  Procedure Laterality Date  . Appendectomy    . Hemorrhoid surgery    . 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 Video assisted thoracoscopy (vats)/empyema Right 07/13/1999    drainage of post-operative empyema following RLLobectomy  . Carotid endarterectomy Left 2011    [facility] - complicated by post-op stroke  . Iliac artery stent Bilateral 2011    [facility] - performed for asymptomatic iliac artery disease  . Fracture surgery Left     foot  . Prostate surgery      TURP  . Percutaneous coronary stent intervention (pci-s)  1999    RCA  . Intraoperative transesophageal echocardiogram N/A 11/20/2013    Procedure: INTRAOPERATIVE TRANSESOPHAGEAL ECHOCARDIOGRAM;  Surgeon: Rexene Alberts, MD;  Location: [place];  Service: Open Heart Surgery;  Laterality: N/A;  . Coronary artery bypass graft N/A 11/20/2013    Procedure: OFF PUMP CORONARY ARTERY BYPASS GRAFTING (CABG) x2: LIMA to LAD, SVG to Obtuse Marginal 1.;  Surgeon: Rexene Alberts, MD;  Location: [place];  Service: Open Heart Surgery;  Laterality: N/A;  . Esophageal biopsy Left 11/20/2013    Procedure: BIOPSY Left Upper Lobe;  Surgeon: Rexene Alberts, MD;  Location: [place];  Service: Open Heart Surgery;  Laterality: Left;  FAMILY HISTORY:  Family History  Problem Relation Age of Onset  . Heart attack Mother   . Heart attack Father   . Cancer Brother     esophagus and lung  . Cancer Brother     lung    SOCIAL HISTORY:  History  Substance Use Topics  . Smoking status: Former Smoker -- 1.00 packs/day for 30 years    Types: Cigarettes    Quit date: 03/03/1979  . Smokeless tobacco:  Current User    Types: Chew  . Alcohol Use: No    ALLERGIES: Demerol  MEDICATIONS:  Current Outpatient Prescriptions  Medication Sig Dispense Refill  . albuterol (PROVENTIL HFA;VENTOLIN HFA) 108 (90 BASE) MCG/ACT inhaler Inhale 2 puffs into the lungs every 6 (six) hours as needed for wheezing or shortness of breath.      Marland Kitchen aspirin EC 325 MG EC tablet Take 1 tablet (325 mg total) by mouth daily.  30 tablet  0  . atorvastatin (LIPITOR) 20 MG tablet Take 1 tablet (20 mg total) by mouth daily.      . benzonatate (TESSALON) 200 MG capsule Take 200 mg by mouth 3 (three) times daily as needed for cough.      . Fluticasone-Salmeterol (ADVAIR) 250-50 MCG/DOSE AEPB Inhale 1 puff into the lungs 2 (two) times daily.      . furosemide (LASIX) 20 MG tablet Take 20 mg by mouth daily.      Marland Kitchen ipratropium-albuterol (DUONEB) 0.5-2.5 (3) MG/3ML SOLN Take 3 mLs by nebulization 3 (three) times daily.      Marland Kitchen levothyroxine (SYNTHROID, LEVOTHROID) 88 MCG tablet Take 88 mcg by mouth daily before breakfast.      . lisinopril (PRINIVIL,ZESTRIL) 5 MG tablet Take 1 tablet (5 mg total) by mouth daily.      . metoprolol tartrate (LOPRESSOR) 25 MG tablet Take 0.5 tablets (12.5 mg total) by mouth 2 (two) times daily.      . predniSONE (DELTASONE) 10 MG tablet Take 10 mg by mouth daily.      . traMADol (ULTRAM) 50 MG tablet Take 1 tablet (50 mg total) by mouth every 6 (six) hours as needed for moderate pain.  30 tablet  0  . zaleplon (SONATA) 10 MG capsule Take 10 mg by mouth at bedtime as needed for sleep.       No current facility-administered medications for this encounter.    REVIEW OF SYSTEMS:  A 15 point review of systems is documented in the electronic medical record. This was obtained by the nursing staff. However, I reviewed this with the patient to discuss relevant findings and make appropriate changes.Positive for multiple skin cancers of his scalp treated with excision.    Pertinent items are noted in  HPI.  PHYSICAL EXAM:  Filed Vitals:   12/16/13 0839  BP: 144/55  Pulse: 66  Temp: 97.7 F (36.5 C)  Resp: 20  .144 lb 12.8 oz (65.681 kg). Pleasant male. Appears stated age. No respiratory distress. Normal breath sounds bilaterally   LABORATORY DATA:  Lab Results  Component Value Date   WBC 9.0 11/25/2013   HGB 9.0* 11/25/2013   HCT 27.6* 11/25/2013   MCV 90.8 11/25/2013   PLT 165 11/25/2013   Lab Results  Component Value Date   NA 148* 11/25/2013   K 4.5 11/25/2013   CL 108 11/25/2013   CO2 28 11/25/2013   Lab Results  Component Value Date   ALT 16 11/17/2013   AST 20 11/17/2013   ALKPHOS  65 11/17/2013   BILITOT <0.2* 11/17/2013     RADIOGRAPHY: Dg Chest 2 View  11/25/2013   CLINICAL DATA:  Post CABG, shortness of breath, chest discomfort  EXAM: CHEST  2 VIEW  COMPARISON:  11/24/2013; 11/22/2013; 11/17/2013; Chest CT - 10/12/2013  FINDINGS: Grossly unchanged cardiac silhouette and mediastinal contours with atherosclerotic plaque within the thoracic aorta. Post median sternotomy. Improved aeration of the lungs with persistent ill-defined heterogeneous airspace opacities within the left mid lung. Grossly unchanged right apical pleural parenchymal thickening with associated mild right-sided volume loss. Interval removal of support apparatus. No pneumothorax. No change to minimal increase in trace bilateral pleural effusions, right greater than left. No evidence of edema. Bilateral pleural calcifications re- demonstrated. Unchanged bones including mild to moderate compression deformity of a lower thoracic vertebral body.  IMPRESSION: 1. Interval removal of support apparatus.  No pneumothorax. 2. Overall improved aeration of the lungs with persistent left mid lung heterogeneous opacities worrisome for atelectasis, infection and/or contusion. Continued attention on follow-up is recommended. 3. Sequela of prior asbestos exposure as above.   Electronically Signed   By: Sandi Mariscal M.D.   On: 11/25/2013  07:52   Dg Chest 2 View  11/17/2013   CLINICAL DATA:  Preoperative respiratory exam for CABG.  EXAM: CHEST - 2 VIEW  COMPARISON:  02/18/2013 chest x-ray, CT of the chest on 10/12/2013 and PET scan on 10/13/2013.  FINDINGS: The heart size and mediastinal contours are within normal limits. Left upper lobe pulmonary nodule visible which has been characterized by CT and PET. This measures approximately 1.4 cm. Underlying lungs show chronic disease. There is no evidence of pulmonary edema, consolidation, pneumothorax or pleural fluid. The visualized skeletal structures are unremarkable.  IMPRESSION: Left upper lobe pulmonary nodule.  No acute process identified.   Electronically Signed   By: Aletta Edouard M.D.   On: 11/17/2013 16:47   Dg Chest Port 1 View  11/24/2013   CLINICAL DATA:  Post LEFT lung biopsy and CABG  EXAM: PORTABLE CHEST - 1 VIEW  COMPARISON:  Portable exam 7106 hr compared to 11/23/2013 and 11/22/2013  FINDINGS: RIGHT jugular central venous catheter with tip projecting over SVC.  LEFT basilar thoracostomy tube.  Enlargement of cardiac silhouette post CABG.  Pulmonary vascular congestion.  Bibasilar atelectasis.  Persistent RIGHT apical pleural thickening/scarring.  Small focus of LEFT upper lobe infiltrate persists.  Remaining lungs clear.  No gross pleural effusion or pneumothorax.  IMPRESSION: Bibasilar atelectasis with small focus of persistent LEFT upper lobe infiltrate.  Enlargement of cardiac silhouette with pulmonary vascular congestion post CABG.   Electronically Signed   By: Lavonia Dana M.D.   On: 11/24/2013 09:51   Dg Chest Port 1 View  11/23/2013   CLINICAL DATA:  Assess chest tube  EXAM: PORTABLE CHEST - 1 VIEW  COMPARISON:  Chest x-ray from yesterday  FINDINGS: A mediastinal drain has been removed. A left basilar chest tube and right IJ sheath remain. No definitive pneumothorax. Interval increase in diffuse interstitial coarsening. Bilateral calcified pleural plaques. Small right  pleural effusion, superimposed on chronic pleural thickening.  IMPRESSION: 1. Increasing pulmonary venous congestion. 2. Small right pleural effusion. 3. No pneumothorax.   Electronically Signed   By: Jorje Guild M.D.   On: 11/23/2013 06:46   Dg Chest Port 1 View  11/22/2013   CLINICAL DATA:  Post CABG (11/20/2013)  EXAM: PORTABLE CHEST - 1 VIEW  COMPARISON:  11/21/2013; 11/20/2013; 11/17/2013; chest CT - 11/11/2013  FINDINGS: Grossly unchanged  cardiac silhouette and mediastinal contours post median sternotomy. Interval extubation and removal of enteric tube. Interval removal of right jugular approach PA catheter with remaining vascular sheath tip projected over the central aspect of the right internal jugular vein. Interval removal of mediastinal drains. Stable positioning of support apparatus. No pneumothorax. Bilateral pleural calcifications are grossly unchanged. There is unchanged asymmetric right apical pleural parenchymal thickening. No pneumothorax. No definite evidence of edema. Trace bilateral effusions are not excluded. Unchanged bones.  IMPRESSION: 1. Interval extubation and removal of support apparatus as above. Stable positioning of left-sided chest tubes and right jugular approach vascular sheath. No pneumothorax. 2. Trace bilateral effusions without evidence of edema. 3. Stable sequela of prior asbestos exposure.   Electronically Signed   By: Sandi Mariscal M.D.   On: 11/22/2013 10:23   Dg Chest Portable 1 View In Am  11/21/2013   CLINICAL DATA:  Status post median sternotomy  EXAM: PORTABLE CHEST - 1 VIEW  COMPARISON:  11/20/2013  FINDINGS: Postsurgical changes are again seen. The Swan-Ganz catheter lies within the right pulmonary artery. A mediastinal drain an and left thoracostomy catheters are again seen and stable. No pneumothorax is noted. Persistent masslike densities are noted within the left lung. The an endotracheal tube is noted 5 cm above the carina. A nasogastric catheter extends  into the stomach.  The linear density is noted overlying the right atrium which appears to extend from the abdomen. Correlation as to any possible central venous line from the femoral approach.  IMPRESSION: Tubes and lines as described above.  No pneumothorax with stable densities in the left mid lung. These may represent fluid trapped within the major fissure.   Electronically Signed   By: Inez Catalina M.D.   On: 11/21/2013 08:42   Dg Chest Portable 1 View  11/20/2013   CLINICAL DATA:  Status post median sternotomy.  EXAM: PORTABLE CHEST - 1 VIEW  COMPARISON:  Two-view chest x-ray 11/17/2013  FINDINGS: The patient is now status post median sternotomy. 8 sternal wires are in place. The endotracheal tube terminates at the level clavicles, well above the carina. The tip of the Swan-Ganz catheter is in the main scratch the the tip the Swan-Ganz catheter is in the right pulmonary artery, in satisfactory position. A mediastinal drain is in place. A left-sided chest tube is in place. There is no pneumothorax. Asymmetric left upper lobe airspace disease is present. Small bilateral pleural effusions are noted. Mild generalized edema is present.  IMPRESSION: 1. Status post median sternotomy. 2. The support apparatus is in satisfactory position as described. 3. Asymmetric left upper lobe airspace disease. This likely represents asymmetric edema in this patient with known emphysema. Aspiration is considered less likely. 4. Mild generalized edema is also present along with small bilateral pleural effusions.   Electronically Signed   By: Lawrence Santiago M.D.   On: 11/20/2013 13:21      IMPRESSION: T1N0 SCCA of the left upper lobe   PLAN:.We discussed his disease and prognosis. We discussed stereotactic body radiotherapy and its efficacy in controlling tumors locally. We discussed that unlike surgery SBRT did not address the lymph nodes so there is a reported 15% failure in the mediastinal nodes after SBRT.  We discussed  the process of simulation and placement of tattoos.  We discussed 3-5 treatments as an outpatient. We discussed the use of 4D CT to monitor tumor motion and daily conebeam CT for target localization. We discussed shoulder stiffness and fatigue as the main side  effects of treatment. We discussed the rare damage to other critical normal structures within the chest. I would like for him to be cleared by Dr. Ricard Dillon from a cardiac standpoint before proceeding forward.  I have asked him to call me after he has seen Dr. Ricard Dillon so we can get his simulation scheduled.  We discussed that after treatment he could continue to be followed here or with Dr. Hinton Rao in [place].    I also discussed with him the role of radiation in skin cancers.  I discussed fractionated treatment to his scalp in [place] with one of our partners if he is interested.  I spent 60 minutes  face to face with the patient and more than 50% of that time was spent in counseling and/or coordination of care.   ------------------------------------------------  Thea Silversmith, MD

## 2013-12-16 NOTE — Progress Notes (Signed)
Please see the Nurse Progress Note in the MD Initial Consult Encounter for this patient. 

## 2013-12-17 DIAGNOSIS — C4442 Squamous cell carcinoma of skin of scalp and neck: Secondary | ICD-10-CM | POA: Diagnosis not present

## 2013-12-17 DIAGNOSIS — D485 Neoplasm of uncertain behavior of skin: Secondary | ICD-10-CM | POA: Diagnosis not present

## 2013-12-17 DIAGNOSIS — L570 Actinic keratosis: Secondary | ICD-10-CM | POA: Diagnosis not present

## 2013-12-18 DIAGNOSIS — Z5189 Encounter for other specified aftercare: Secondary | ICD-10-CM | POA: Diagnosis not present

## 2013-12-18 DIAGNOSIS — M6281 Muscle weakness (generalized): Secondary | ICD-10-CM | POA: Diagnosis not present

## 2013-12-18 DIAGNOSIS — R262 Difficulty in walking, not elsewhere classified: Secondary | ICD-10-CM | POA: Diagnosis not present

## 2013-12-18 DIAGNOSIS — J449 Chronic obstructive pulmonary disease, unspecified: Secondary | ICD-10-CM | POA: Diagnosis not present

## 2013-12-18 DIAGNOSIS — I10 Essential (primary) hypertension: Secondary | ICD-10-CM | POA: Diagnosis not present

## 2013-12-23 ENCOUNTER — Other Ambulatory Visit: Payer: Self-pay | Admitting: Thoracic Surgery (Cardiothoracic Vascular Surgery)

## 2013-12-23 DIAGNOSIS — I739 Peripheral vascular disease, unspecified: Secondary | ICD-10-CM

## 2013-12-28 ENCOUNTER — Ambulatory Visit
Admission: RE | Admit: 2013-12-28 | Discharge: 2013-12-28 | Disposition: A | Discharge status: Home / Self Care | Payer: Medicare Other | Source: Ambulatory Visit | Attending: Thoracic Surgery (Cardiothoracic Vascular Surgery) | Admitting: Thoracic Surgery (Cardiothoracic Vascular Surgery)

## 2013-12-28 ENCOUNTER — Ambulatory Visit (INDEPENDENT_AMBULATORY_CARE_PROVIDER_SITE_OTHER): Payer: Self-pay | Admitting: Thoracic Surgery (Cardiothoracic Vascular Surgery)

## 2013-12-28 ENCOUNTER — Encounter: Payer: Self-pay | Admitting: Thoracic Surgery (Cardiothoracic Vascular Surgery)

## 2013-12-28 VITALS — BP 152/80 | HR 70 | Ht 68.0 in | Wt 144.0 lb

## 2013-12-28 DIAGNOSIS — I739 Peripheral vascular disease, unspecified: Secondary | ICD-10-CM

## 2013-12-28 DIAGNOSIS — C3490 Malignant neoplasm of unspecified part of unspecified bronchus or lung: Secondary | ICD-10-CM

## 2013-12-28 DIAGNOSIS — C3492 Malignant neoplasm of unspecified part of left bronchus or lung: Secondary | ICD-10-CM

## 2013-12-28 DIAGNOSIS — R918 Other nonspecific abnormal finding of lung field: Secondary | ICD-10-CM | POA: Diagnosis not present

## 2013-12-28 DIAGNOSIS — Z951 Presence of aortocoronary bypass graft: Secondary | ICD-10-CM

## 2013-12-28 NOTE — Patient Instructions (Signed)
The patient should continue to avoid any heavy lifting or strenuous use of arms or shoulders for at least a total of three months from the time of surgery.  The patient is encouraged to enroll and participate in the outpatient cardiac rehab program beginning as soon as practical.  He may resume pulmonary rehab as well.  The patient may proceed with radiation therapy for lung cancer as soon as practical.

## 2013-12-28 NOTE — Progress Notes (Signed)
[address]       [place] [address]             [phone]     CARDIOTHORACIC SURGERY OFFICE NOTE  Referring Provider is Lorretta Harp, MD PCP is Townsend Roger, MD   HPI:  Patient returns for routine followup status post off-pump coronary artery bypass grafting x2 with core needle biopsy of left upper lobe lung mass on 11/20/2013.  Prior to surgery the patient was experiencing accelerating symptoms of exertional chest discomfort and shortness of breath, and diagnostic cardiac catheterization revealed left main disease with high grade ostial stenosis of the left anterior descending and left circumflex coronary arteries. The patient was also in process of being worked up for the presence of a small but enlarging lung mass deep within the parenchyma of the left upper lobe that was hypermetabolic on PET imaging.  The patient has remote history of lung cancer having previously undergone right lower lobectomy in the distant past, and he has been treated chronically for severe COPD. 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 Core needle biopsy of the left upper lobe mass performed at the time of surgery confirmed the presence of squamous cell carcinoma.  Postoperatively he has done remarkably well. He was initially discharged to rehabilitation, but he quickly recovered and was discharged home.  He has been seen in followup since hospital discharge by Dr. Joya Gaskins, and more recently he was seen in consultation by Dr. Pablo Ledger from radiation oncology.  He has not yet been seen in followup by Dr. Gwenlyn Found. He returns for office for routine followup today. He is doing remarkably well. He denies any symptoms of exertional chest discomfort or shortness of breath. 2 weeks ago he had an episode of prolonged sternal  pain that developed when he was mopping the floor. When he quit mopping the pain resolved and he has not had any further episodes since. The pain he describes at that time sounds to be distinctly musculoskeletal and related to his sternotomy. He is not taking any sort of pain relievers at this time. His breathing is back to baseline and perhaps even better than it was prior to surgery. His appetite is good. He wants to resume mowing his lawn.   Current Outpatient Prescriptions  Medication Sig Dispense Refill  . aspirin EC 325 MG EC tablet Take 1 tablet (325 mg total) by mouth daily.  30 tablet  0  . atorvastatin (LIPITOR) 20 MG tablet Take 1 tablet (20 mg total) by mouth daily.      . Fluticasone-Salmeterol (ADVAIR) 250-50 MCG/DOSE AEPB Inhale 1 puff into the lungs 2 (two) times daily.      . furosemide (LASIX) 20 MG tablet Take 20 mg by mouth daily.      Marland Kitchen ipratropium-albuterol (DUONEB) 0.5-2.5 (3) MG/3ML SOLN Take 3 mLs by nebulization 3 (three) times daily.      Marland Kitchen levothyroxine (SYNTHROID, LEVOTHROID) 88 MCG tablet Take 88 mcg by mouth daily before breakfast.      . lisinopril (PRINIVIL,ZESTRIL) 5 MG tablet Take 1 tablet (5 mg total) by mouth daily.      . metoprolol tartrate (LOPRESSOR) 25 MG tablet Take 0.5 tablets (12.5 mg total) by mouth 2 (two) times daily.      . predniSONE (DELTASONE) 10 MG tablet Take 10 mg  by mouth daily.      Marland Kitchen albuterol (PROVENTIL HFA;VENTOLIN HFA) 108 (90 BASE) MCG/ACT inhaler Inhale 2 puffs into the lungs every 6 (six) hours as needed for wheezing or shortness of breath.       No current facility-administered medications for this visit.      Physical Exam:   BP 152/80  Pulse 70  Ht 5\' 8"  (1.727 m)  Wt 144 lb (65.318 kg)  BMI 21.90 kg/m2  General:  Well-appearing  Chest:   Clear  CV:   Regular rate and rhythm without murmur  Incisions:  Healing nicely, sternum is stable  Abdomen:  Soft and nontender  Extremities:  Warm and well-perfused  Diagnostic  Tests:  CHEST 2 VIEW  COMPARISON: 11/25/2013  FINDINGS:  Cardiac silhouette is normal in size and configuration. Changes from  cardiac surgery are stable. No mediastinal or hilar masses. No  evidence of adenopathy.  Small area of left upper lobe opacity is noted similar to older  exams, improved when compared to the prior study where there is more  prominent upper lobe airspace opacity.  Stable right greater than left upper lobe pleural parenchymal  scarring, stable mild lung base interstitial thickening, and stable  calcified pleural plaques.  No acute findings in the lungs. There are minimal pleural effusions.  Lungs are hyperexpanded.  Bony thorax is demineralized. Mild wedge-shaped compression  deformity is noted at the thoracolumbar junction, stable.  IMPRESSION:  No acute cardiopulmonary disease.  Opacity noted previously in the left upper lobe has improved. Other  chronic findings as described are stable.  Electronically Signed  By: Lajean Manes M.D.  On: 12/28/2013 15:25    Impression:  Patient is doing remarkably well less than 6 weeks following off-pump coronary artery bypass grafting x2. Core needle biopsy of the left upper lobe mass performed at the time of surgery confirmed the presence of squamous cell carcinoma, clinical stage I (T1N0M0).  Because of the patient's advanced chronic lung disease and reduced lung capacity from previous right lower lobectomy, he would not be considered candidate for surgical resection. SBRT has been recommended as an alternative, and I feel that he could proceed with treatment as soon as practical. In addition, the patient would benefit from participation in the cardiac and pulmonary rehabilitation programs.  Plan:  I've encouraged patient to continue to gradually increase his physical activity as tolerated with his primary limitation that he remain from any heavy lifting or strenuous use of his arms or shoulders for another 2 months. I  think he may resume the pulmonary rehabilitation program and begin the outpatient cardiac rehabilitation program. I think it would be reasonable to go ahead and proceed with stimulation and definitive treatment of his lung cancer using SBRT.  We have not made any changes to his current medications. The patient will return for routine followup in 3 months.   Valentina Gu. Roxy Manns, MD 12/28/2013 3:55 PM

## 2013-12-29 DIAGNOSIS — C3430 Malignant neoplasm of lower lobe, unspecified bronchus or lung: Secondary | ICD-10-CM | POA: Diagnosis not present

## 2013-12-31 ENCOUNTER — Ambulatory Visit
Admission: RE | Admit: 2013-12-31 | Discharge: 2013-12-31 | Disposition: A | Discharge status: Home / Self Care | Payer: Medicare Other | Source: Ambulatory Visit | Attending: Radiation Oncology | Admitting: Radiation Oncology

## 2013-12-31 DIAGNOSIS — Z951 Presence of aortocoronary bypass graft: Secondary | ICD-10-CM | POA: Diagnosis not present

## 2013-12-31 DIAGNOSIS — Z85118 Personal history of other malignant neoplasm of bronchus and lung: Secondary | ICD-10-CM | POA: Diagnosis not present

## 2013-12-31 DIAGNOSIS — Z510 Encounter for antineoplastic radiation therapy: Secondary | ICD-10-CM | POA: Diagnosis not present

## 2013-12-31 DIAGNOSIS — C3410 Malignant neoplasm of upper lobe, unspecified bronchus or lung: Secondary | ICD-10-CM | POA: Diagnosis not present

## 2013-12-31 DIAGNOSIS — C3430 Malignant neoplasm of lower lobe, unspecified bronchus or lung: Secondary | ICD-10-CM | POA: Diagnosis not present

## 2013-12-31 DIAGNOSIS — Z902 Acquired absence of lung [part of]: Secondary | ICD-10-CM | POA: Diagnosis not present

## 2013-12-31 DIAGNOSIS — D649 Anemia, unspecified: Secondary | ICD-10-CM | POA: Diagnosis not present

## 2013-12-31 DIAGNOSIS — I2510 Atherosclerotic heart disease of native coronary artery without angina pectoris: Secondary | ICD-10-CM | POA: Diagnosis not present

## 2013-12-31 DIAGNOSIS — C3492 Malignant neoplasm of unspecified part of left bronchus or lung: Secondary | ICD-10-CM

## 2013-12-31 NOTE — Progress Notes (Signed)
[facility] Simulation and Treatment Planning Note   Name: Glenn Reilly MRN: [id number]  Date: 12/31/2013  DOB: 1929/01/18  Status: outpatient  DIAGNOSIS: T1N0 Stage I SCCa of the left upper lobe   SIDE: Left  CONSENT VERIFIED: yes  SET UP AND IMMOBILIZATION: Patient is setup supine in a vac loc with a custom moldable pillow for head and neck immobilization   NARRATIVE: The patient was brought to the [facility].  Identity was confirmed.  All relevant records and images related to the planned course of therapy were reviewed.  Then, the patient was positioned in a stable reproducible clinical set-up for radiation therapy.  CT images were obtained.  Skin markings were placed.  A four dimensional simulation was then performed to track tumor movement throughout the patients' breathing cycle. The CT images were loaded into the planning software where the target and avoidance structures were contoured.  The GTV was outlined on the free breathing, 4D and MIP image sets.  The radiation prescription was entered and confirmed.   TREATMENT PLANNING NOTE:  Treatment planning then occurred. I have requested 3D simulation with [facility] of the spinal cord, total lungs and gross tumor volume. I have also requested mlcs and an isodose plan.   Special treatment procedure will be performed as Genevive Bi will be receiving high dose per fraction.

## 2014-01-04 DIAGNOSIS — I10 Essential (primary) hypertension: Secondary | ICD-10-CM | POA: Diagnosis not present

## 2014-01-11 DIAGNOSIS — Z7982 Long term (current) use of aspirin: Secondary | ICD-10-CM | POA: Diagnosis not present

## 2014-01-11 DIAGNOSIS — I10 Essential (primary) hypertension: Secondary | ICD-10-CM | POA: Diagnosis not present

## 2014-01-11 DIAGNOSIS — R222 Localized swelling, mass and lump, trunk: Secondary | ICD-10-CM | POA: Diagnosis not present

## 2014-01-11 DIAGNOSIS — J449 Chronic obstructive pulmonary disease, unspecified: Secondary | ICD-10-CM | POA: Diagnosis not present

## 2014-01-11 DIAGNOSIS — Z5189 Encounter for other specified aftercare: Secondary | ICD-10-CM | POA: Diagnosis not present

## 2014-01-11 DIAGNOSIS — I69921 Dysphasia following unspecified cerebrovascular disease: Secondary | ICD-10-CM | POA: Diagnosis not present

## 2014-01-11 DIAGNOSIS — E785 Hyperlipidemia, unspecified: Secondary | ICD-10-CM | POA: Diagnosis not present

## 2014-01-11 DIAGNOSIS — I2510 Atherosclerotic heart disease of native coronary artery without angina pectoris: Secondary | ICD-10-CM | POA: Diagnosis not present

## 2014-01-11 DIAGNOSIS — I739 Peripheral vascular disease, unspecified: Secondary | ICD-10-CM | POA: Diagnosis not present

## 2014-01-11 DIAGNOSIS — Z79899 Other long term (current) drug therapy: Secondary | ICD-10-CM | POA: Diagnosis not present

## 2014-01-12 ENCOUNTER — Ambulatory Visit: Payer: Medicare Other | Admitting: Radiation Oncology

## 2014-01-12 ENCOUNTER — Ambulatory Visit
Admission: RE | Admit: 2014-01-12 | Discharge: 2014-01-12 | Disposition: A | Discharge status: Home / Self Care | Payer: Medicare Other | Source: Ambulatory Visit | Attending: Radiation Oncology | Admitting: Radiation Oncology

## 2014-01-12 VITALS — BP 198/98 | HR 75 | Temp 98.0°F | Resp 20 | Wt 146.0 lb

## 2014-01-12 DIAGNOSIS — Z902 Acquired absence of lung [part of]: Secondary | ICD-10-CM | POA: Diagnosis not present

## 2014-01-12 DIAGNOSIS — C3492 Malignant neoplasm of unspecified part of left bronchus or lung: Secondary | ICD-10-CM

## 2014-01-12 DIAGNOSIS — Z951 Presence of aortocoronary bypass graft: Secondary | ICD-10-CM | POA: Diagnosis not present

## 2014-01-12 DIAGNOSIS — C3410 Malignant neoplasm of upper lobe, unspecified bronchus or lung: Secondary | ICD-10-CM | POA: Diagnosis not present

## 2014-01-12 DIAGNOSIS — Z510 Encounter for antineoplastic radiation therapy: Secondary | ICD-10-CM | POA: Diagnosis not present

## 2014-01-12 DIAGNOSIS — Z85118 Personal history of other malignant neoplasm of bronchus and lung: Secondary | ICD-10-CM | POA: Diagnosis not present

## 2014-01-12 DIAGNOSIS — L219 Seborrheic dermatitis, unspecified: Secondary | ICD-10-CM | POA: Diagnosis not present

## 2014-01-12 DIAGNOSIS — L708 Other acne: Secondary | ICD-10-CM | POA: Diagnosis not present

## 2014-01-12 DIAGNOSIS — I2510 Atherosclerotic heart disease of native coronary artery without angina pectoris: Secondary | ICD-10-CM | POA: Diagnosis not present

## 2014-01-12 NOTE — Progress Notes (Signed)
Weekly Management Note Current Dose:  18 Gy  Projected Dose: 54 Gy   Narrative:  The patient presents for routine under treatment assessment.  CBCT/MVCT images/Port film x-rays were reviewed.  The chart was checked. Doing well. Going back to cardiac rehab tomorrow.   Physical Findings: Weight: 146 lb (66.225 kg). Unchanged  Impression:  The patient is tolerating radiation.  Plan:  Continue treatment as planned.

## 2014-01-12 NOTE — Progress Notes (Signed)
  Radiation Oncology         [phone]) [phone] ________________________________  Name: WILBURT MESSINA MRN: [id number]  Date: 01/12/2014  DOB: Mar 30, 1929  Stereotactic Body Radiotherapy Treatment Procedure Note (Fraction #1)  NARRATIVE:  Alyson Locket Lybbert was brought to the stereotactic radiation treatment machine and placed supine on the CT couch. The patient was set up for stereotactic body radiotherapy on the body fix pillow.  3D TREATMENT PLANNING AND DOSIMETRY:  The patient's radiation plan was reviewed and approved prior to starting treatment.  It showed 3-dimensional radiation distributions overlaid onto the planning CT.  The [facility] for the target structures as well as the organs at risk were reviewed. The documentation of this is filed in the radiation oncology EMR.  SIMULATION VERIFICATION:  The patient underwent CT imaging on the treatment unit.  These were carefully aligned to document that the ablative radiation dose would cover the target volume and maximally spare the nearby organs at risk according to the planned distribution.  SPECIAL TREATMENT PROCEDURE: Alyson Locket Muriel received high dose ablative stereotactic body radiotherapy to the planned target volume without unforeseen complications. Treatment was delivered uneventfully. The high doses associated with stereotactic body radiotherapy and the significant potential risks require careful treatment set up and patient monitoring constituting a special treatment procedure   STEREOTACTIC TREATMENT MANAGEMENT:  Following delivery, the patient was evaluated clinically. The patient tolerated treatment without significant acute effects, and was discharged to home in stable condition.    PLAN: Continue treatment as planned.  _________________________   Thea Silversmith, MD

## 2014-01-13 DIAGNOSIS — I10 Essential (primary) hypertension: Secondary | ICD-10-CM | POA: Diagnosis not present

## 2014-01-13 DIAGNOSIS — J449 Chronic obstructive pulmonary disease, unspecified: Secondary | ICD-10-CM | POA: Diagnosis not present

## 2014-01-13 DIAGNOSIS — C3410 Malignant neoplasm of upper lobe, unspecified bronchus or lung: Secondary | ICD-10-CM | POA: Diagnosis not present

## 2014-01-13 DIAGNOSIS — I739 Peripheral vascular disease, unspecified: Secondary | ICD-10-CM | POA: Diagnosis not present

## 2014-01-13 DIAGNOSIS — Z09 Encounter for follow-up examination after completed treatment for conditions other than malignant neoplasm: Secondary | ICD-10-CM | POA: Diagnosis not present

## 2014-01-13 DIAGNOSIS — Z79899 Other long term (current) drug therapy: Secondary | ICD-10-CM | POA: Diagnosis not present

## 2014-01-13 DIAGNOSIS — Z5189 Encounter for other specified aftercare: Secondary | ICD-10-CM | POA: Diagnosis not present

## 2014-01-13 DIAGNOSIS — R1310 Dysphagia, unspecified: Secondary | ICD-10-CM | POA: Diagnosis not present

## 2014-01-13 DIAGNOSIS — I779 Disorder of arteries and arterioles, unspecified: Secondary | ICD-10-CM | POA: Diagnosis not present

## 2014-01-13 DIAGNOSIS — I69991 Dysphagia following unspecified cerebrovascular disease: Secondary | ICD-10-CM | POA: Diagnosis not present

## 2014-01-13 DIAGNOSIS — Z85118 Personal history of other malignant neoplasm of bronchus and lung: Secondary | ICD-10-CM | POA: Diagnosis not present

## 2014-01-13 DIAGNOSIS — I2510 Atherosclerotic heart disease of native coronary artery without angina pectoris: Secondary | ICD-10-CM | POA: Diagnosis not present

## 2014-01-13 DIAGNOSIS — E785 Hyperlipidemia, unspecified: Secondary | ICD-10-CM | POA: Diagnosis not present

## 2014-01-13 DIAGNOSIS — Z951 Presence of aortocoronary bypass graft: Secondary | ICD-10-CM | POA: Diagnosis not present

## 2014-01-13 DIAGNOSIS — D649 Anemia, unspecified: Secondary | ICD-10-CM | POA: Diagnosis not present

## 2014-01-14 ENCOUNTER — Ambulatory Visit
Admission: RE | Admit: 2014-01-14 | Discharge: 2014-01-14 | Disposition: A | Discharge status: Home / Self Care | Payer: Medicare Other | Source: Ambulatory Visit | Attending: Radiation Oncology | Admitting: Radiation Oncology

## 2014-01-14 DIAGNOSIS — Z510 Encounter for antineoplastic radiation therapy: Secondary | ICD-10-CM | POA: Diagnosis not present

## 2014-01-14 DIAGNOSIS — I2510 Atherosclerotic heart disease of native coronary artery without angina pectoris: Secondary | ICD-10-CM | POA: Diagnosis not present

## 2014-01-14 DIAGNOSIS — C3410 Malignant neoplasm of upper lobe, unspecified bronchus or lung: Secondary | ICD-10-CM

## 2014-01-14 DIAGNOSIS — Z951 Presence of aortocoronary bypass graft: Secondary | ICD-10-CM | POA: Diagnosis not present

## 2014-01-14 DIAGNOSIS — Z85118 Personal history of other malignant neoplasm of bronchus and lung: Secondary | ICD-10-CM | POA: Diagnosis not present

## 2014-01-14 DIAGNOSIS — Z902 Acquired absence of lung [part of]: Secondary | ICD-10-CM | POA: Diagnosis not present

## 2014-01-15 NOTE — Progress Notes (Signed)
  Radiation Oncology         [phone]) [phone] ________________________________  Name: YASHAS CAMILLI MRN: [id number]  Date: 01/14/2014  DOB: 1928/05/26  Stereotactic Body Radiotherapy Treatment Procedure Note  NARRATIVE:  TAGE FEGGINS was brought to the stereotactic radiation treatment machine and placed supine on the CT couch. The patient was set up for stereotactic body radiotherapy on the body fix pillow.  3D TREATMENT PLANNING AND DOSIMETRY:  The patient's radiation plan was reviewed and approved prior to starting treatment.  It showed 3-dimensional radiation distributions overlaid onto the planning CT.  The [facility] for the target structures as well as the organs at risk were reviewed. The documentation of this is filed in the radiation oncology EMR.  SIMULATION VERIFICATION:  The patient underwent CT imaging on the treatment unit.  These were carefully aligned to document that the ablative radiation dose would cover the target volume and maximally spare the nearby organs at risk according to the planned distribution.  SPECIAL TREATMENT PROCEDURE: Alyson Locket Tirrell received high dose ablative stereotactic body radiotherapy to the planned target volume without unforeseen complications. Treatment was delivered uneventfully. The high doses associated with stereotactic body radiotherapy and the significant potential risks require careful treatment set up and patient monitoring constituting a special treatment procedure   STEREOTACTIC TREATMENT MANAGEMENT:  Following delivery, the patient was evaluated clinically. The patient tolerated treatment without significant acute effects, and was discharged to home in stable condition.    PLAN: Continue treatment as planned.  _________________________   Thea Silversmith, MD

## 2014-01-19 ENCOUNTER — Ambulatory Visit
Admission: RE | Admit: 2014-01-19 | Discharge: 2014-01-19 | Disposition: A | Discharge status: Home / Self Care | Payer: Medicare Other | Source: Ambulatory Visit | Attending: Radiation Oncology | Admitting: Radiation Oncology

## 2014-01-19 ENCOUNTER — Ambulatory Visit: Payer: Medicare Other | Admitting: Critical Care Medicine

## 2014-01-19 ENCOUNTER — Encounter: Payer: Self-pay | Admitting: Radiation Oncology

## 2014-01-19 DIAGNOSIS — Z951 Presence of aortocoronary bypass graft: Secondary | ICD-10-CM | POA: Diagnosis not present

## 2014-01-19 DIAGNOSIS — I2510 Atherosclerotic heart disease of native coronary artery without angina pectoris: Secondary | ICD-10-CM | POA: Diagnosis not present

## 2014-01-19 DIAGNOSIS — C3492 Malignant neoplasm of unspecified part of left bronchus or lung: Secondary | ICD-10-CM

## 2014-01-19 DIAGNOSIS — C3410 Malignant neoplasm of upper lobe, unspecified bronchus or lung: Secondary | ICD-10-CM | POA: Diagnosis not present

## 2014-01-19 DIAGNOSIS — Z510 Encounter for antineoplastic radiation therapy: Secondary | ICD-10-CM | POA: Diagnosis not present

## 2014-01-19 DIAGNOSIS — Z902 Acquired absence of lung [part of]: Secondary | ICD-10-CM | POA: Diagnosis not present

## 2014-01-19 DIAGNOSIS — Z85118 Personal history of other malignant neoplasm of bronchus and lung: Secondary | ICD-10-CM | POA: Diagnosis not present

## 2014-01-19 NOTE — Progress Notes (Signed)
  Radiation Oncology         [phone] ________________________________  Name: Glenn Reilly MRN: [id number]  Date: 01/19/2014  DOB: 03-04-1929  Stereotactic Body Radiotherapy Treatment Procedure Note  NARRATIVE:  Glenn Reilly was brought to the stereotactic radiation treatment machine and placed supine on the CT couch. The patient was set up for stereotactic body radiotherapy on the body fix pillow.  3D TREATMENT PLANNING AND DOSIMETRY:  The patient's radiation plan was reviewed and approved prior to starting treatment.  It showed 3-dimensional radiation distributions overlaid onto the planning CT.  The [facility] for the target structures as well as the organs at risk were reviewed. The documentation of this is filed in the radiation oncology EMR.  SIMULATION VERIFICATION:  The patient underwent CT imaging on the treatment unit.  These were carefully aligned to document that the ablative radiation dose would cover the target volume and maximally spare the nearby organs at risk according to the planned distribution.  SPECIAL TREATMENT PROCEDURE: Glenn Reilly received high dose ablative stereotactic body radiotherapy to the planned target volume without unforeseen complications. Treatment was delivered uneventfully. The high doses associated with stereotactic body radiotherapy and the significant potential risks require careful treatment set up and patient monitoring constituting a special treatment procedure   STEREOTACTIC TREATMENT MANAGEMENT:  Following delivery, the patient was evaluated clinically. The patient tolerated treatment without significant acute effects, and was discharged to home in stable condition.    PLAN: Continue treatment as planned.  _________________________   Thea Silversmith, MD

## 2014-01-20 NOTE — Progress Notes (Signed)
°  Radiation Oncology         [phone]) [phone] ________________________________  Name: Glenn Reilly MRN: [id number]  Date: 01/19/2014  DOB: 05-31-1928  End of Treatment Note  Diagnosis:   Stage I NSCLC of the left upper lobe     Indication for treatment:  Curative       Radiation treatment dates:   01/12/2014, 01/14/2014, 01/19/2014  Site/dose:   Left upper lobe/ 54 gy in 3 fractions at 18 Gy per fraction  Beams/energy:   Dynamic conformal arcs with 6 MV photons in a filter free mode were utilized. Conebeam CT was performed daily for localization before treatment.   Narrative: The patient tolerated radiation treatment relatively well.   He had no ill effects from treatment.   Plan: The patient has completed radiation treatment. The patient will return to radiation oncology clinic for routine followup in one month. I advised them to call or return sooner if they have any questions or concerns related to their recovery or treatment.  ------------------------------------------------  Thea Silversmith, MD

## 2014-01-28 DIAGNOSIS — Z23 Encounter for immunization: Secondary | ICD-10-CM | POA: Diagnosis not present

## 2014-01-28 DIAGNOSIS — N400 Enlarged prostate without lower urinary tract symptoms: Secondary | ICD-10-CM | POA: Diagnosis not present

## 2014-01-28 DIAGNOSIS — I2510 Atherosclerotic heart disease of native coronary artery without angina pectoris: Secondary | ICD-10-CM | POA: Diagnosis not present

## 2014-01-28 DIAGNOSIS — E785 Hyperlipidemia, unspecified: Secondary | ICD-10-CM | POA: Diagnosis not present

## 2014-01-28 DIAGNOSIS — E039 Hypothyroidism, unspecified: Secondary | ICD-10-CM | POA: Diagnosis not present

## 2014-01-28 DIAGNOSIS — I10 Essential (primary) hypertension: Secondary | ICD-10-CM | POA: Diagnosis not present

## 2014-01-28 DIAGNOSIS — J449 Chronic obstructive pulmonary disease, unspecified: Secondary | ICD-10-CM | POA: Diagnosis not present

## 2014-02-01 ENCOUNTER — Telehealth: Payer: Self-pay | Admitting: *Deleted

## 2014-02-01 NOTE — Telephone Encounter (Signed)
On 02-01-14 fax medical records to [facility] it was all of Dr. Pablo Ledger notes

## 2014-02-02 ENCOUNTER — Encounter: Payer: Self-pay | Admitting: Critical Care Medicine

## 2014-02-02 ENCOUNTER — Ambulatory Visit (INDEPENDENT_AMBULATORY_CARE_PROVIDER_SITE_OTHER): Payer: Medicare Other | Admitting: Critical Care Medicine

## 2014-02-02 VITALS — BP 132/64 | HR 73 | Temp 98.8°F | Ht 68.0 in | Wt 147.6 lb

## 2014-02-02 DIAGNOSIS — C3410 Malignant neoplasm of upper lobe, unspecified bronchus or lung: Secondary | ICD-10-CM | POA: Diagnosis not present

## 2014-02-02 DIAGNOSIS — J438 Other emphysema: Secondary | ICD-10-CM | POA: Diagnosis not present

## 2014-02-02 DIAGNOSIS — J432 Centrilobular emphysema: Secondary | ICD-10-CM

## 2014-02-02 DIAGNOSIS — C3492 Malignant neoplasm of unspecified part of left bronchus or lung: Secondary | ICD-10-CM

## 2014-02-02 DIAGNOSIS — J449 Chronic obstructive pulmonary disease, unspecified: Secondary | ICD-10-CM | POA: Diagnosis not present

## 2014-02-02 MED ORDER — IPRATROPIUM BROMIDE 0.03 % NA SOLN: 2.0000 | Freq: Two times a day (BID) | NASAL | Status: DC

## 2014-02-02 NOTE — Patient Instructions (Signed)
Try atrovent nasal two puff each nostril twice daily No other changes Return 3 months

## 2014-02-02 NOTE — Progress Notes (Signed)
Subjective:    [name], male    DOB: 1928-09-26, 80 y.o.   MRN: [id number]  HPI  02/02/2014 Chief Complaint  Patient presents with  . Follow-up    had last radiation tx last wk,open hrt. surgery 7 wks. ago,sob worse with exertion,cough-dry,no wheezing,mid-chest tightness,no fcs  Finished XRT, last Rx one week ago.  Pt notes not able to much of anything. Pt notes some pndrip, choking with this.   S/p CABG.   Sats ok on RA.  Now in cardiac rehab at [facility].    Review of Systems  Constitutional:   No  weight loss, night sweats,  Fevers, chills, fatigue, lassitude. HEENT:   No headaches,  Difficulty swallowing,  Tooth/dental problems,  Sore throat,                No sneezing, itching, ear ache, nasal congestion, post nasal drip,   CV:  No chest pain,  Orthopnea, PND, swelling in lower extremities, anasarca, dizziness, palpitations  GI  No heartburn, indigestion, abdominal pain, nausea, vomiting, diarrhea, change in bowel habits, loss of appetite  Resp: Notes shortness of breath with exertion not  at rest.  No excess mucus, no productive cough,  No non-productive cough,  No coughing up of blood.  No change in color of mucus.  No wheezing.  No chest wall deformity  Skin: no rash or lesions.  GU: no dysuria, change in color of urine, no urgency or frequency.  No flank pain.  MS:  No joint pain or swelling.  No decreased range of motion.  No back pain.  Psych:  No change in mood or affect. No depression or anxiety.  No memory loss.     Objective:   Physical Exam  Filed Vitals:   02/02/14 0942  BP: 132/64  Pulse: 73  Temp: 98.8 F (37.1 C)  TempSrc: Oral  Height: 5\' 8"  (1.727 m)  Weight: 147 lb 9.6 oz (66.951 kg)  SpO2: 96%    Gen: Pleasant, well-nourished, in no distress,  normal affect  ENT: No lesions,  mouth clear,  oropharynx clear, no postnasal drip  Neck: No JVD, no TMG, no carotid bruits  Lungs: No use of accessory muscles, no dullness to  percussion, distant breath sounds Median sternotomy is healing  Cardiovascular: RRR, heart sounds normal, no murmur or gallops, no peripheral edema  Abdomen: soft and NT, no HSM,  BS normal  Musculoskeletal: No deformities, no cyanosis or clubbing  Neuro: alert, non focal  Skin: Warm, no lesions or rashes  No results found.      Assessment & Plan:   COPD with emphysema COPD with primary emphysematous component stable at this time Plan Maintain inhaled medications as prescribed Maintain prednisone at 10 mg daily dose  Squamous cell carcinoma of left lung, stage I Stage I left upper lobe squamous cell carcinoma status post radiotherapy Plan Observation status post primary radiation therapy to left upper lobe using stereotactic technique    Updated Medication List Outpatient Encounter Prescriptions as of 02/02/2014  Medication Sig  . albuterol (PROVENTIL HFA;VENTOLIN HFA) 108 (90 BASE) MCG/ACT inhaler Inhale 2 puffs into the lungs every 6 (six) hours as needed for wheezing or shortness of breath.  Marland Kitchen aspirin EC 325 MG EC tablet Take 1 tablet (325 mg total) by mouth daily.  . furosemide (LASIX) 20 MG tablet Take 20 mg by mouth daily.  Marland Kitchen ipratropium-albuterol (DUONEB) 0.5-2.5 (3) MG/3ML SOLN Take 3 mLs by nebulization 3 (three) times  daily.  . levothyroxine (SYNTHROID, LEVOTHROID) 88 MCG tablet Take 88 mcg by mouth daily before breakfast.  . metoprolol tartrate (LOPRESSOR) 25 MG tablet Take 0.5 tablets (12.5 mg total) by mouth 2 (two) times daily.  . predniSONE (DELTASONE) 10 MG tablet Take 10 mg by mouth daily.  Marland Kitchen ipratropium (ATROVENT) 0.03 % nasal spray Place 2 sprays into both nostrils every 12 (twelve) hours.  . [DISCONTINUED] Fluticasone-Salmeterol (ADVAIR) 250-50 MCG/DOSE AEPB Inhale 1 puff into the lungs 2 (two) times daily.

## 2014-02-03 DIAGNOSIS — L570 Actinic keratosis: Secondary | ICD-10-CM | POA: Diagnosis not present

## 2014-02-03 NOTE — Assessment & Plan Note (Signed)
Stage I left upper lobe squamous cell carcinoma status post radiotherapy Plan Observation status post primary radiation therapy to left upper lobe using stereotactic technique

## 2014-02-03 NOTE — Assessment & Plan Note (Signed)
COPD with primary emphysematous component stable at this time Plan Maintain inhaled medications as prescribed Maintain prednisone at 10 mg daily dose

## 2014-02-05 DIAGNOSIS — Z951 Presence of aortocoronary bypass graft: Secondary | ICD-10-CM | POA: Diagnosis not present

## 2014-02-05 DIAGNOSIS — Z5189 Encounter for other specified aftercare: Secondary | ICD-10-CM | POA: Diagnosis not present

## 2014-02-05 DIAGNOSIS — E785 Hyperlipidemia, unspecified: Secondary | ICD-10-CM | POA: Diagnosis not present

## 2014-02-05 DIAGNOSIS — I69991 Dysphagia following unspecified cerebrovascular disease: Secondary | ICD-10-CM | POA: Diagnosis not present

## 2014-02-05 DIAGNOSIS — I2510 Atherosclerotic heart disease of native coronary artery without angina pectoris: Secondary | ICD-10-CM | POA: Diagnosis not present

## 2014-02-05 DIAGNOSIS — I739 Peripheral vascular disease, unspecified: Secondary | ICD-10-CM | POA: Diagnosis not present

## 2014-02-05 DIAGNOSIS — I10 Essential (primary) hypertension: Secondary | ICD-10-CM | POA: Diagnosis not present

## 2014-02-05 DIAGNOSIS — J449 Chronic obstructive pulmonary disease, unspecified: Secondary | ICD-10-CM | POA: Diagnosis not present

## 2014-02-12 DIAGNOSIS — J441 Chronic obstructive pulmonary disease with (acute) exacerbation: Secondary | ICD-10-CM | POA: Diagnosis not present

## 2014-02-18 DIAGNOSIS — Z0279 Encounter for issue of other medical certificate: Secondary | ICD-10-CM

## 2014-02-19 ENCOUNTER — Ambulatory Visit
Admission: RE | Admit: 2014-02-19 | Discharge: 2014-02-19 | Disposition: A | Discharge status: Home / Self Care | Payer: Medicare Other | Source: Ambulatory Visit | Attending: Radiation Oncology | Admitting: Radiation Oncology

## 2014-02-19 VITALS — BP 154/69 | HR 76 | Temp 97.9°F | Wt 150.3 lb

## 2014-02-19 DIAGNOSIS — C3492 Malignant neoplasm of unspecified part of left bronchus or lung: Secondary | ICD-10-CM

## 2014-02-19 NOTE — Progress Notes (Signed)
Patient here for routine one month follow up completion of radiation 54 gy in 3 fractions to left upper lobe .denies pain.shortness of breath on exertion.

## 2014-02-22 ENCOUNTER — Telehealth: Payer: Self-pay | Admitting: *Deleted

## 2014-02-22 NOTE — Progress Notes (Signed)
   Department of Radiation Oncology  Phone:  [phone] Fax:        [phone]   Name: PHILLIPE CLEMON MRN: [id number]  DOB: 1929/01/07  Date: 02/19/2014  Follow Up Visit Note  Diagnosis:    ICD-9-CM ICD-10-CM  1. Squamous cell carcinoma of lung, stage I, left 162.9 C34.92   Summary and Interval since last radiation: 1 month from SBRT to the left upper lobe  Interval History: Alfio presents today for routine followup.  He is feeling well. He is still having excisions of skin cancer from his scalp by his local dermatologist. We discussed radiation for these in the past and he has considered this. He is meeting Dr. Roxy Manns in 2 weeks. He would like to have his scans in [place] if possible. He has no ill effects from treatment. He is feeling well.   Physical Exam:  Filed Vitals:   02/19/14 1439  BP: 154/69  Pulse: 76  Temp: 97.9 F (36.6 C)  Weight: 150 lb 4.8 oz (68.176 kg)  SpO2: 100%   Pleasant male in no distress. No skin changes over his back or chest.   IMPRESSION: Dhani is a 80 y.o. male s/p SBRT for a left upper lobe lesion with resolving acute effects of treatment.   PLAN:  He is doing well. We discussed the need for a scan in about 2 weeks to establish a new baseline. We discussed that post radiation scarring is common after treatment and may be seen as "enlargement" of the treated mass even months after treatment. We dicussed I would see him back in 6 months with a CT at that time as well and he will see Dr. Hinton Rao. He can always call with questions as well.

## 2014-02-22 NOTE — Telephone Encounter (Signed)
CALLED PATIENT TO INFORM OF LAB, CT, AND FU, SPOKE WITH PATIENT AND HE IS AWARE OF THESE APPTS.

## 2014-03-05 ENCOUNTER — Encounter (HOSPITAL_COMMUNITY): Payer: Self-pay

## 2014-03-05 ENCOUNTER — Ambulatory Visit
Admission: RE | Admit: 2014-03-05 | Discharge: 2014-03-05 | Disposition: A | Discharge status: Home / Self Care | Payer: Medicare Other | Source: Ambulatory Visit | Attending: Radiation Oncology | Admitting: Radiation Oncology

## 2014-03-05 ENCOUNTER — Ambulatory Visit (HOSPITAL_COMMUNITY)
Admission: RE | Admit: 2014-03-05 | Discharge: 2014-03-05 | Disposition: A | Discharge status: Home / Self Care | Payer: Medicare Other | Source: Ambulatory Visit | Attending: Radiation Oncology | Admitting: Radiation Oncology

## 2014-03-05 DIAGNOSIS — C3412 Malignant neoplasm of upper lobe, left bronchus or lung: Secondary | ICD-10-CM | POA: Diagnosis not present

## 2014-03-05 DIAGNOSIS — C3492 Malignant neoplasm of unspecified part of left bronchus or lung: Secondary | ICD-10-CM | POA: Diagnosis not present

## 2014-03-05 DIAGNOSIS — C3490 Malignant neoplasm of unspecified part of unspecified bronchus or lung: Secondary | ICD-10-CM | POA: Diagnosis not present

## 2014-03-05 DIAGNOSIS — Z902 Acquired absence of lung [part of]: Secondary | ICD-10-CM | POA: Insufficient documentation

## 2014-03-05 DIAGNOSIS — Z510 Encounter for antineoplastic radiation therapy: Secondary | ICD-10-CM | POA: Diagnosis not present

## 2014-03-05 LAB — BUN AND CREATININE (CC13)
BUN: 28.7 mg/dL — ABNORMAL HIGH (ref 7.0–26.0)
Creatinine: 1.4 mg/dL — ABNORMAL HIGH (ref 0.7–1.3)

## 2014-03-05 MED ORDER — IOHEXOL 300 MG/ML  SOLN
50.0000 mL | Freq: Once | INTRAMUSCULAR | Status: AC | PRN
  Administered 2014-03-05: 50 mL via INTRAVENOUS

## 2014-03-09 ENCOUNTER — Ambulatory Visit (INDEPENDENT_AMBULATORY_CARE_PROVIDER_SITE_OTHER): Payer: Medicare Other | Admitting: Cardiovascular Disease

## 2014-03-09 ENCOUNTER — Encounter: Payer: Self-pay | Admitting: Cardiovascular Disease

## 2014-03-09 VITALS — BP 110/80 | HR 70 | Ht 68.0 in | Wt 146.0 lb

## 2014-03-09 DIAGNOSIS — I739 Peripheral vascular disease, unspecified: Secondary | ICD-10-CM

## 2014-03-09 DIAGNOSIS — I2510 Atherosclerotic heart disease of native coronary artery without angina pectoris: Secondary | ICD-10-CM | POA: Diagnosis not present

## 2014-03-09 DIAGNOSIS — I779 Disorder of arteries and arterioles, unspecified: Secondary | ICD-10-CM

## 2014-03-09 DIAGNOSIS — I10 Essential (primary) hypertension: Secondary | ICD-10-CM

## 2014-03-09 NOTE — Progress Notes (Signed)
03/09/2014 Alyson Locket Buzzelli   01/27/1929  [id number]  Primary Physician Townsend Roger, MD Primary Cardiologist: Lorretta Harp MD Renae Gloss   HPI:  Mr. Toothman is an 80 year old thin appearing widowed Caucasian male father of one son who accompanies him today. I last saw him in the office 11/09/13. He was referred by Dr. Vassie Loll Eyk  His cardiac risk factor profile is remarkable for remote tobacco abuse, hyperlipidemia and hypertension. He has never had a heart attack but did have a stroke in 2011 at the time of left carotid endarterectomy. He said coronary artery disease with stenting in 1999 performed by Dr. Eustace Quail and again 2004. His left calf because k patent stent with moderate LAD and circumflex disease. He does have diastolic dysfunction. He has had lung cancer and right lung resection by Dr. Baldemar Friday in 2001. On Thursday night after mowing his lawn on a riding lower developed back pain rating to his chest associated with dyspnea on exertion. This resolved throughout the night has not recurred. He did have apparently inferior T-wave inversion on EKG today which was new. A Myoview stress test was normal as was a 2-D echo echocardiogram. Lower extremity arterial Doppler studies showed ankle-brachial indices of greater than one bilaterally with a suggestion of mild to moderate iliac disease. He developed exertional left upper extremity discomfort with dyspnea over the last month which is fairly reproducible and similar to his prior symptoms before coronary intervention. Based on this, I performed outpatient cardiac catheterization on 11/05/13 in the right radial approach. I demonstrated 50% left main, high-grade proximal LAD, ostial circumflex and moderate mid RCA disease with normal LV function. Based on this, I suggested coronary artery bypass grafting is the best revascularization strategy especially in light of his pulmonary nodule which could potentially be biopsied  at the time of surgery. This was performed by Dr. Roxy Manns on 11/20/13 with a LIMA to his LAD and a vein to the circumflex coronary artery. He also had a Tru-Cut needle biopsy of his left upper lobe nodule which turned out to be some squamous cell carcinoma. He has received radiation therapy for this. He did participate in cardiac rehabilitation. Recent chest CT showed diminished size of his nodule.    Current Outpatient Prescriptions  Medication Sig Dispense Refill  . albuterol (PROVENTIL HFA;VENTOLIN HFA) 108 (90 BASE) MCG/ACT inhaler Inhale 2 puffs into the lungs every 6 (six) hours as needed for wheezing or shortness of breath.      Marland Kitchen aspirin EC 325 MG EC tablet Take 1 tablet (325 mg total) by mouth daily.  30 tablet  0  . furosemide (LASIX) 20 MG tablet Take 20 mg by mouth daily.      Marland Kitchen ipratropium (ATROVENT) 0.03 % nasal spray       . ipratropium-albuterol (DUONEB) 0.5-2.5 (3) MG/3ML SOLN Take 3 mLs by nebulization 3 (three) times daily.      Marland Kitchen levothyroxine (SYNTHROID, LEVOTHROID) 88 MCG tablet Take 88 mcg by mouth daily before breakfast.      . predniSONE (DELTASONE) 10 MG tablet Take 10 mg by mouth daily.       No current facility-administered medications for this visit.    Allergies  Allergen Reactions  . Demerol [Meperidine] Other (See Comments)    shock    History   Social History  . Marital Status: Widowed    Spouse Name: N/A    Number of Children: 1  . Years of Education: N/A  Occupational History  . 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 Smoking status: Former Smoker -- 1.00 packs/day for 30 years    Types: Cigarettes    Quit date: 03/03/1979  . Smokeless tobacco: Current User    Types: Chew  . Alcohol Use: No  . Drug Use: No  . Sexual Activity: Not on file   Other Topics Concern  . Not on file   Social History Narrative  . No narrative on file     Review of Systems: General: negative for chills, fever,  night sweats or weight changes.  Cardiovascular: negative for chest pain, dyspnea on exertion, edema, orthopnea, palpitations, paroxysmal nocturnal dyspnea or shortness of breath Dermatological: negative for rash Respiratory: negative for cough or wheezing Urologic: negative for hematuria Abdominal: negative for nausea, vomiting, diarrhea, bright red blood per rectum, melena, or hematemesis Neurologic: negative for visual changes, syncope, or dizziness All other systems reviewed and are otherwise negative except as noted above.    Blood pressure 110/80, pulse 70, height 5\' 8"  (1.727 m), weight 146 lb (66.225 kg).  General appearance: alert and no distress Neck: no adenopathy, no carotid bruit, no JVD, supple, symmetrical, trachea midline and thyroid not enlarged, symmetric, no tenderness/mass/nodules Lungs: clear to auscultation bilaterally Heart: regular rate and rhythm, S1, S2 normal, no murmur, click, rub or gallop Extremities: extremities normal, atraumatic, no cyanosis or edema  EKG not performed today  ASSESSMENT AND PLAN:   CAD, NATIVE VESSEL History of CAD by [facility] catheterization 11/05/13 with left main, LAD and proximal circumflex disease. He had preserved ejection fraction. He underwent off-pump coronary artery bypass grafting X 2 with a LIMA to his LAD and vein to the circumflex coronary artery. He participated in chronic rehabilitation. He's recuperated nicely. He no longer has left arm pain. He does however have shortness of breath which he attributes to the beta blocker exacerbating his reactive airways disease which we will taper him off of.  Carotid artery disease History of carotid artery disease status post left carotid endarterectomy in 2011. This is followed by duplex ultrasound  HYPERLIPIDEMIA-MIXED Statin intolerant on red yeast rice followed by his PCP  HYPERTENSION, BENIGN Controlled on current medications      Lorretta Harp MD [facility]  [facility] 03/09/2014 4:26 PM

## 2014-03-09 NOTE — Assessment & Plan Note (Addendum)
History of CAD by [facility] catheterization 11/05/13 with left main, LAD and proximal circumflex disease. He had preserved ejection fraction. He underwent off-pump coronary artery bypass grafting X 2 with a LIMA to his LAD and vein to the circumflex coronary artery. He participated in chronic rehabilitation. He's recuperated nicely. He no longer has left arm pain. He does however have shortness of breath which he attributes to the beta blocker exacerbating his reactive airways disease which we will taper him off of.

## 2014-03-09 NOTE — Assessment & Plan Note (Signed)
History of carotid artery disease status post left carotid endarterectomy in 2011. This is followed by duplex ultrasound

## 2014-03-09 NOTE — Assessment & Plan Note (Signed)
Statin intolerant on red yeast rice followed by his PCP

## 2014-03-09 NOTE — Assessment & Plan Note (Signed)
Controlled on current medications 

## 2014-03-09 NOTE — Patient Instructions (Signed)
  We will see you back in follow up in 6 weeks with Glenn Reilly, the pharmacist, for blood pressure and heart rate evaluation.  You will see an extender in 3 months and with Glenn Reilly in 6 months.   Glenn Reilly has ordered: Wean the Metoprolol (lopressor) over the next month.  Start in the morning taking 1/2 tablet daily.  Do this for 2 weeks, then go down to 1/2 tablet every other day for 2 weeks.  You may then stop the medication.

## 2014-04-01 ENCOUNTER — Encounter: Payer: Self-pay | Admitting: Critical Care Medicine

## 2014-04-01 ENCOUNTER — Telehealth: Payer: Self-pay | Admitting: Critical Care Medicine

## 2014-04-01 MED ORDER — LISINOPRIL 5 MG PO TABS: 5.0000 mg | ORAL_TABLET | Freq: Every day | ORAL | Status: DC

## 2014-04-01 MED ORDER — FUROSEMIDE 20 MG PO TABS: ORAL_TABLET | ORAL | Status: DC

## 2014-04-01 NOTE — Telephone Encounter (Signed)
Called and spoke with pts daughter and she is aware of PW recs.  She will let  The pt know of the change in prednisone for now.  Nothing further is needed.

## 2014-04-01 NOTE — Telephone Encounter (Signed)
Called and spoke to pt's daughter, Glenn Reilly. Pt just completed radiation in late October/ Pt has been c/o increase in SOB x 2 weeks. Pt also has increase in fatigue and dry cough. Pt denies f/c/s or CP. Pt's daughter is questioning if this is normal for the progression of disease or if there is anything that can be done to lessen his SOB. Pt gets SOB with little activity. Pt is taking all meds on med list as prescribed. Pt's daughter requesting recs by PW.  Dr. Joya Gaskins please advise.   Allergies  Allergen Reactions  . Demerol [Meperidine] Other (See Comments)    shock

## 2014-04-01 NOTE — Telephone Encounter (Signed)
Recommend : increase prednisone : 40mg  per day x 4 days then down by 10mg  every 4 days till at 10mg  a day and hold

## 2014-04-05 ENCOUNTER — Ambulatory Visit: Payer: Medicare Other | Admitting: Thoracic Surgery (Cardiothoracic Vascular Surgery)

## 2014-04-19 ENCOUNTER — Other Ambulatory Visit: Payer: Self-pay | Admitting: *Deleted

## 2014-04-19 ENCOUNTER — Ambulatory Visit (INDEPENDENT_AMBULATORY_CARE_PROVIDER_SITE_OTHER): Payer: Medicare Other | Admitting: Thoracic Surgery (Cardiothoracic Vascular Surgery)

## 2014-04-19 ENCOUNTER — Encounter: Payer: Self-pay | Admitting: Thoracic Surgery (Cardiothoracic Vascular Surgery)

## 2014-04-19 VITALS — BP 125/66 | HR 75 | Resp 16 | Ht 68.0 in | Wt 148.0 lb

## 2014-04-19 DIAGNOSIS — I2510 Atherosclerotic heart disease of native coronary artery without angina pectoris: Secondary | ICD-10-CM

## 2014-04-19 DIAGNOSIS — Z951 Presence of aortocoronary bypass graft: Secondary | ICD-10-CM

## 2014-04-19 DIAGNOSIS — C3492 Malignant neoplasm of unspecified part of left bronchus or lung: Secondary | ICD-10-CM

## 2014-04-19 DIAGNOSIS — J189 Pneumonia, unspecified organism: Secondary | ICD-10-CM

## 2014-04-19 NOTE — Patient Instructions (Addendum)
Patient may resume unrestricted physical activity without any particular limitations at this time.  Contact Dr Bettina Gavia office to schedule follow up appointment as soon as practical.

## 2014-04-19 NOTE — Progress Notes (Signed)
[address]       [place] [address]             [phone]     CARDIOTHORACIC SURGERY OFFICE NOTE  Referring Provider is Lorretta Harp, MD PCP is Townsend Roger, MD   HPI:  Patient returns for routine followup status post off-pump coronary artery bypass grafting x2 with core needle biopsy of left upper lobe lung mass on 11/20/2013. Prior to surgery the patient was experiencing accelerating symptoms of exertional chest discomfort and shortness of breath, and diagnostic cardiac catheterization revealed left main disease with high grade ostial stenosis of the left anterior descending and left circumflex coronary arteries. The patient was also in process of being worked up for the presence of a small but enlarging lung mass deep within the parenchyma of the left upper lobe that was hypermetabolic on PET imaging. The patient has remote history of lung cancer having previously undergone right lower lobectomy in the distant past, and he has been treated chronically for severe COPD. 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 Core needle biopsy of the left upper lobe mass performed at the time of surgery confirmed the presence of squamous cell carcinoma (T1N0). Postoperatively he did well, and last fall he completed a course of SBRT for his lung cancer under the care and direction of Dr Pablo Ledger. He was last seen in followup by Dr. Joya Gaskins in September, and more recently he was seen in followup by Dr. Gwenlyn Found.  He returns to the office today for routine follow-up now 5 months since his original surgery. Overall the patient continues to do well, although he states that he feels as though his breathing has gotten worse. He gets short of breath with mild activity and intermittently at rest. He has not had any  recurrent pain in his chest, neck, or arms to suggest a recurrence of angina pectoris. He denies any fevers chills or productive cough.   Current Outpatient Prescriptions  Medication Sig Dispense Refill  . albuterol (PROVENTIL HFA;VENTOLIN HFA) 108 (90 BASE) MCG/ACT inhaler Inhale 2 puffs into the lungs every 6 (six) hours as needed for wheezing or shortness of breath.    Marland Kitchen aspirin EC 325 MG EC tablet Take 1 tablet (325 mg total) by mouth daily. 30 tablet 0  . furosemide (LASIX) 20 MG tablet 40mg  on Monday, Wednesday, and Friday, 20mg  all other days. 30 tablet 0  . ipratropium (ATROVENT) 0.03 % nasal spray     . ipratropium-albuterol (DUONEB) 0.5-2.5 (3) MG/3ML SOLN Take 3 mLs by nebulization 3 (three) times daily.    Marland Kitchen levothyroxine (SYNTHROID, LEVOTHROID) 88 MCG tablet Take 88 mcg by mouth daily before breakfast.    . lisinopril (PRINIVIL,ZESTRIL) 5 MG tablet Take 1 tablet (5 mg total) by mouth daily. 30 tablet 0  . predniSONE (DELTASONE) 10 MG tablet Take 10 mg by mouth daily.    . Red Yeast Rice 600 MG CAPS Take 1 capsule by mouth 2 (two) times daily.     No current facility-administered medications for this visit.      Physical Exam:   BP 125/66 mmHg  Pulse 75  Resp 16  Ht 5\' 8"  (1.727 m)  Wt 148 lb (67.132 kg)  BMI 22.51 kg/m2  SpO2 98%  General:  Elderly and frail but well-appearing  Chest:  Clear to auscultation with somewhat diminished breath sounds on the left side in comparison to the right. No wheezes rales or rhonchi are noted  CV:   Regular rate and rhythm without murmur  Incisions:  Completely healed  Abdomen:  Soft nontender  Extremities:  Warm and well perfused  Diagnostic Tests:  CT CHEST WITH CONTRAST  TECHNIQUE: Multidetector CT imaging of the chest was performed during intravenous contrast administration.  CONTRAST: 61mL OMNIPAQUE IOHEXOL 300 MG/ML SOLN  COMPARISON: PET-CT scan 10/13/2013, CT scan 10/12/2013  FINDINGS: There is interval  decrease in volume of the left upper lobe nodule which measures 6 mm compared to 14 mm on prior (image 19, series 5). branching nodularity in the inferior lingula (image 35 from series 5) is new but has an infectious pattern.  There is volume loss in the right hemi thorax. There is a small right effusion not changed prior. There pleural calcifications.  There is no axillary lymphadenopathy. Several small paratracheal lymph nodes are noted in the high right positioned. For example 6 mm node on image 10, series 2 is not changed from prior. No mediastinal adenopathy. No pericardial fluid. This for pulmonary embolism. Esophagus is normal.  Limited view of the upper abdomen demonstrates normal adrenal glands. No focal hepatic lesion. No aggressive osseous lesion.  IMPRESSION: 1. Interval reduction in volume of the left upper lobe pulmonary nodule following radiation therapy. 2. New mild branching pattern in the inferior lingular appears infectious or inflammatory. Recommend attention on routine follow-up. 3. Stable postsurgical change in the right hemi thorax. 4. No evidence disease progression.   Electronically Signed  By: Suzy Bouchard M.D.  On: 03/05/2014 17:31    Impression:  Clinically the patient seems to doing fairly well now 5 months status post off-pump coronary artery bypass grafting 2. He has completed a course of SBRT to the left upper lobe for treatment of his recently discovered squamous cell carcinoma of the lung. Clinically the patient states that though he feels his breathing has slowly gotten worse over the last few months. He denies any symptoms of recurrent chest pain or chest tightness to suggest a cardiac origin. He seems to have somewhat diminished breath sounds on the left side. Follow-up CT scan performed in October revealed some mild opacity in the lingular segments of the left lung which were new and potentially suspicious for pneumonia or post  radiation pneumonitis.   Plan:  We will obtain a routine chest x-ray today. I suggested that the patient contact Dr. Bettina Gavia office to schedule follow-up appointment as soon as practical, particularly if his breathing seems to continue to get worse.. The patient will return for follow-up in our office next summer, a proximally 1 year following his original surgery.  I spent in excess of 15 minutes during the conduct of this office consultation and >50% of this time involved direct face-to-face encounter with the patient for counseling and/or coordination of their care.   Valentina Gu. Roxy Manns, MD 04/19/2014 5:04 PM

## 2014-04-20 ENCOUNTER — Telehealth: Payer: Self-pay | Admitting: Critical Care Medicine

## 2014-04-20 DIAGNOSIS — R911 Solitary pulmonary nodule: Secondary | ICD-10-CM | POA: Diagnosis not present

## 2014-04-20 DIAGNOSIS — R0989 Other specified symptoms and signs involving the circulatory and respiratory systems: Secondary | ICD-10-CM | POA: Diagnosis not present

## 2014-04-20 DIAGNOSIS — J189 Pneumonia, unspecified organism: Secondary | ICD-10-CM | POA: Diagnosis not present

## 2014-04-20 DIAGNOSIS — I517 Cardiomegaly: Secondary | ICD-10-CM | POA: Diagnosis not present

## 2014-04-20 NOTE — Telephone Encounter (Signed)
Appointment Request From: Glenn Reilly      With Provider: Asencion Noble, MD [address] [facility]]      Preferred Date Range: From 04/20/2014 To 04/23/2014      Preferred Times: Tuesday Morning, Tuesday Afternoon      Reason for visit: Office Visit      Comments:   Glenn Reilly was seen by Dr. Roxy Manns 12/7. Dr. Roxy Manns recommended he schedule an appointment with Dr. Joya Gaskins asap. Dr. Roxy Manns wrote an order for a CXR to r/o pneumonia. He noted decreased breath sounds on the left. Please call Glenn Reilly at [phone] or call me at [phone].       Thank you!    Montrose Sink

## 2014-04-20 NOTE — Telephone Encounter (Signed)
Called spoke with daughter. She wanted to scheduled to come in tomorrow bc pt may have PNA. appt scheduled to see MW at 3:45 tomorrow in [place] office.

## 2014-04-21 ENCOUNTER — Other Ambulatory Visit (INDEPENDENT_AMBULATORY_CARE_PROVIDER_SITE_OTHER): Payer: Medicare Other

## 2014-04-21 ENCOUNTER — Ambulatory Visit (INDEPENDENT_AMBULATORY_CARE_PROVIDER_SITE_OTHER): Payer: Medicare Other | Admitting: Internal Medicine

## 2014-04-21 ENCOUNTER — Encounter: Payer: Self-pay | Admitting: Internal Medicine

## 2014-04-21 ENCOUNTER — Encounter: Payer: Self-pay | Admitting: Thoracic Surgery (Cardiothoracic Vascular Surgery)

## 2014-04-21 ENCOUNTER — Ambulatory Visit (INDEPENDENT_AMBULATORY_CARE_PROVIDER_SITE_OTHER): Payer: Medicare Other | Admitting: Pharmacist Clinician (PhC)/ Clinical Pharmacy Specialist

## 2014-04-21 VITALS — BP 200/100 | Ht 68.0 in | Wt 147.1 lb

## 2014-04-21 VITALS — BP 176/72 | HR 79 | Ht 68.0 in | Wt 148.6 lb

## 2014-04-21 DIAGNOSIS — I10 Essential (primary) hypertension: Secondary | ICD-10-CM

## 2014-04-21 DIAGNOSIS — I2510 Atherosclerotic heart disease of native coronary artery without angina pectoris: Secondary | ICD-10-CM

## 2014-04-21 DIAGNOSIS — R0600 Dyspnea, unspecified: Secondary | ICD-10-CM

## 2014-04-21 LAB — CBC WITH DIFFERENTIAL/PLATELET
BASOS PCT: 0.2 % (ref 0.0–3.0)
Basophils Absolute: 0 10*3/uL (ref 0.0–0.1)
Eosinophils Absolute: 0 10*3/uL (ref 0.0–0.7)
Eosinophils Relative: 0.2 % (ref 0.0–5.0)
HCT: 34.7 % — ABNORMAL LOW (ref 39.0–52.0)
HEMOGLOBIN: 10.9 g/dL — AB (ref 13.0–17.0)
LYMPHS PCT: 15.2 % (ref 12.0–46.0)
Lymphs Abs: 1.1 10*3/uL (ref 0.7–4.0)
MCHC: 31.5 g/dL (ref 30.0–36.0)
MCV: 77.3 fl — ABNORMAL LOW (ref 78.0–100.0)
MONOS PCT: 6.1 % (ref 3.0–12.0)
Monocytes Absolute: 0.4 10*3/uL (ref 0.1–1.0)
NEUTROS ABS: 5.4 10*3/uL (ref 1.4–7.7)
Neutrophils Relative %: 78.3 % — ABNORMAL HIGH (ref 43.0–77.0)
Platelets: 264 10*3/uL (ref 150.0–400.0)
RBC: 4.49 Mil/uL (ref 4.22–5.81)
RDW: 18.9 % — ABNORMAL HIGH (ref 11.5–15.5)
WBC: 6.9 10*3/uL (ref 4.0–10.5)

## 2014-04-21 LAB — BASIC METABOLIC PANEL
BUN: 32 mg/dL — ABNORMAL HIGH (ref 6–23)
CO2: 28 mEq/L (ref 19–32)
Calcium: 9.3 mg/dL (ref 8.4–10.5)
Chloride: 103 mEq/L (ref 96–112)
Creatinine, Ser: 1.5 mg/dL (ref 0.4–1.5)
GFR: 47.45 mL/min — AB (ref >60.00)
Glucose, Bld: 95 mg/dL (ref 70–99)
POTASSIUM: 3.8 meq/L (ref 3.5–5.1)
Sodium: 139 mEq/L (ref 135–145)

## 2014-04-21 LAB — TSH: TSH: 0.98 u[IU]/mL (ref 0.35–4.50)

## 2014-04-21 LAB — BRAIN NATRIURETIC PEPTIDE: PRO B NATRI PEPTIDE: 140 pg/mL — AB (ref 0.0–100.0)

## 2014-04-21 LAB — SEDIMENTATION RATE: Sed Rate: 24 mm/hr — ABNORMAL HIGH (ref 0–22)

## 2014-04-21 MED ORDER — PANTOPRAZOLE SODIUM 40 MG PO TBEC: 40.0000 mg | DELAYED_RELEASE_TABLET | Freq: Every day | ORAL | Status: DC

## 2014-04-21 MED ORDER — FAMOTIDINE 20 MG PO TABS: ORAL_TABLET | ORAL | Status: DC

## 2014-04-21 MED ORDER — VALSARTAN 80 MG PO TABS: 80.0000 mg | ORAL_TABLET | Freq: Every day | ORAL | Status: DC

## 2014-04-21 NOTE — Patient Instructions (Signed)
Return for a a follow up appointment with the PA in March  Your blood pressure today is 190/88  (goal is <150/90)  Check your blood pressure at home daily (if able) and keep record of the readings.  Take your BP meds as follows: continue with lisinopril 5mg  daily  Bring all of your meds, your BP cuff and your record of home blood pressures to your next appointment.  Exercise as you're able, try to walk approximately 30 minutes per day.  Keep salt intake to a minimum, especially watch canned and prepared boxed foods.  Eat more fresh fruits and vegetables and fewer canned items.  Avoid eating in fast food restaurants.    HOW TO TAKE YOUR BLOOD PRESSURE: . Rest 5 minutes before taking your blood pressure. .  Don't smoke or drink caffeinated beverages for at least 30 minutes before. . Take your blood pressure before (not after) you eat. . Sit comfortably with your back supported and both feet on the floor (don't cross your legs). . Elevate your arm to heart level on a table or a desk. . Use the proper sized cuff. It should fit smoothly and snugly around your bare upper arm. There should be enough room to slip a fingertip under the cuff. The bottom edge of the cuff should be 1 inch above the crease of the elbow. . Ideally, take 3 measurements at one sitting and record the average.

## 2014-04-21 NOTE — Progress Notes (Signed)
Subjective:   [name], male    DOB: [birth year]    MRN: [id number]    Brief patient profile:    End of Treatment Note Diagnosis: Stage I NSCLC of the left upper lobe  Indication for treatment: Curative  Radiation treatment dates: 01/12/2014, 01/14/2014, 01/19/2014 Site/dose: Left upper lobe/ 54 gy in 3 fractions at 18 Gy per fraction  02/02/2014 Chief Complaint  Patient presents with  . Follow-up    had last radiation tx last wk,open hrt. surgery 7 wks. ago,sob worse with exertion,cough-dry,no wheezing,mid-chest tightness,no fcs  Finished XRT, last Rx one week ago.  Pt notes not able to much of anything. Pt notes some pndrip, choking with this.   S/p CABG.   Sats ok on RA.  Now in cardiac rehab at [facility].  rec Try atrovent nasal two puff each nostril twice daily     04/21/2014 f/u ov/Makayli Bracken re: sob worse since finished RT  Chief Complaint  Patient presents with  . Acute Visit    breathing is getting worse, a lot of SOB; left upper lobe rales per Dr. Roxy Manns; CXR done yesterday.    walking was doing better  p cabg to point where up incline to house s stopping s 02 s a problem but gradually worse since mid way through RT At hs choking sensation but no excess mucus/ constant sensation of pnds / cough and throat clearing but no excess mucus, no better with atrovent ns  No obvious day to day or daytime variabilty or assoc excess or purulent mucus or cp or chest tightness, subjective wheeze overt sinus or hb symptoms. No unusual exp hx or h/o childhood pna/ asthma or knowledge of premature birth.  Sleeping ok without nocturnal  or early am exacerbation  of respiratory  c/o's or need for noct saba. Also denies any obvious fluctuation of symptoms with weather or environmental changes or other aggravating or alleviating factors except as outlined above   Current Medications, Allergies, Complete Past Medical History, Past Surgical History, Family History, and  Social History were reviewed in [employer] record.  ROS  The following are not active complaints unless bolded sore throat, dysphagia, dental problems, itching, sneezing,  nasal congestion or excess/ purulent secretions, ear ache,   fever, chills, sweats, unintended wt loss, pleuritic or exertional cp, hemoptysis,  orthopnea pnd or leg swelling, presyncope, palpitations, heartburn, abdominal pain, anorexia, nausea, vomiting, diarrhea  or change in bowel or urinary habits, change in stools or urine, dysuria,hematuria,  rash, arthralgias, visual complaints, headache, numbness weakness or ataxia or problems with walking or coordination,  change in mood/affect or memory.                 Objective:   Physical Exam  Wt Readings from Last 3 Encounters:  04/21/14 148 lb 9.6 oz (67.405 kg)  04/19/14 148 lb (67.132 kg)  03/09/14 146 lb (66.225 kg)    Vital signs reviewed  Gen: amb wm nad / hoarse with mild pseudowheeze  ENT: No lesions,  mouth clear,  oropharynx clear, no postnasal drip  Neck: No JVD, no TMG, no carotid bruits  Lungs: No use of accessory muscles, no dullness to percussion, distant breath sounds Median sternotomy is healing  Cardiovascular: RRR, heart sounds normal, no murmur or gallops, no peripheral edema  Abdomen: soft and NT, no HSM,  BS normal  Musculoskeletal: No deformities, no cyanosis or clubbing  Neuro: alert, non focal  Skin: Warm, no lesions or  rashes      Recent Labs Lab 04/21/14 1604  NA 139  K 3.8  CL 103  CO2 28  BUN 32*  CREATININE 1.5  GLUCOSE 95    Recent Labs Lab 04/21/14 1604  HGB 10.9*  HCT 34.7*  WBC 6.9  PLT 264.0     Lab Results  Component Value Date   TSH 0.98 04/21/2014     Lab Results  Component Value Date   PROBNP 140.0* 04/21/2014     Lab Results  Component Value Date   ESRSEDRATE 24* 04/21/2014          Assessment & Plan:

## 2014-04-21 NOTE — Assessment & Plan Note (Addendum)
04/21/2014  Walked RA x 3 laps @ 185 ft each stopped due to min sob, nl pace, no desat  Symptoms are markedly disproportionate to objective findings and not clear this is a lung problem but pt does appear to have difficult airway management issues.  DDX of  difficult airways management all start with A and  include Adherence, Ace Inhibitors, Acid Reflux, Active Sinus Disease, Alpha 1 Antitripsin deficiency, Anxiety masquerading as Airways dz,  ABPA,  allergy(esp in young), Aspiration (esp in elderly), Adverse effects of DPI,  Active smokers, plus two Bs  = Bronchiectasis and Beta blocker use..and one C= CHF   ACEi at top of usual list of suspects > only way to rule out is stop for > 4 weeks, see hbp  ? Acid (or non-acid) GERD > always difficult to exclude as up to 75% of pts in some series report no assoc GI/ Heartburn symptoms> rec max (24h)  acid suppression and diet restrictions/ reviewed and instructions given in writing.  ? Allergy/ asthma > very unlikely on floor pred of 10 mg daily but ok to increase to 20 mg per day until better then back to 10 mg  ? chf > very unlikely with bnp 140  No evidence of RT pneumonitis/

## 2014-04-21 NOTE — Patient Instructions (Addendum)
Stop lisinopril indefinitely  Start diovan 80 mg one daily   Pantoprazole (protonix) 40 mg   Take 30-60 min before first meal of the day and Pepcid 20 mg one bedtime until return to office    GERD (REFLUX)  is an extremely common cause of respiratory symptoms just like yours , many times with no obvious heartburn at all.    It can be treated with medication, but also with lifestyle changes including avoidance of late meals, excessive alcohol, smoking cessation, and avoid fatty foods, chocolate, peppermint, colas, red wine, and acidic juices such as orange juice.  NO MINT OR MENTHOL PRODUCTS SO NO COUGH DROPS  USE SUGARLESS CANDY INSTEAD (Jolley ranchers or Stover's or Life Savers) or even ice chips will also do - the key is to swallow to prevent all throat clearing. NO OIL BASED VITAMINS - use powdered substitutes.  Prednisone 10 mg 2 until better then reduce to 10 mg daily   Please remember to go to the lab department downstairs for your tests - we will call you with the results when they are available.  Please schedule a follow up office visit in 4 weeks, sooner if not improving over the next 2 weeks

## 2014-04-21 NOTE — Assessment & Plan Note (Signed)
ACE inhibitors are problematic in  pts with airway complaints because  even experienced pulmonologists can't always distinguish ace effects from copd/asthma.  By themselves they don't actually cause a problem, much like oxygen can't by itself start a fire, but they certainly serve as a powerful catalyst or enhancer for any "fire"  or inflammatory process in the upper airway, be it caused by an ET  tube or more commonly reflux (especially in the obese or pts with known GERD or who are on biphoshonates).    In the era of ARB near equivalency until we have a better handle on the reversibility of the airway problem, it just makes sense to avoid ACEI  entirely in the short run and then decide later, having established a level of airway control using a reasonable limited regimen, whether to add back ace but even then being very careful to observe the pt for worsening airway control and number of meds used/ needed to control symptoms.   Try diovan 80 mg daily   See instructions for specific recommendations which were reviewed directly with the patient who was given a copy with highlighter outlining the key components.

## 2014-04-22 ENCOUNTER — Encounter (HOSPITAL_COMMUNITY): Payer: Self-pay | Admitting: Cardiovascular Disease

## 2014-04-22 ENCOUNTER — Telehealth: Payer: Self-pay | Admitting: *Deleted

## 2014-04-22 NOTE — Telephone Encounter (Signed)
error 

## 2014-04-22 NOTE — Assessment & Plan Note (Signed)
Glenn Reilly BP is quite elevated in office today.  He and his daughter explain that he gets very tense when waiting for appointments, and I can see that he is holding his upper body quite tightly.  We discussed his albuterol and prednisone use, both for breathing problems, which have the potential to increase blood pressure further.  I will not make any additions to his current regimen as his home readings really do look good.  He is going to pulmonology this afternoon for follow up on his breathing concerns.

## 2014-04-22 NOTE — Progress Notes (Signed)
04/22/2014 Glenn Reilly [birth year] [id number]   HPI:  Glenn Reilly is a 80 y.o. male patient of Glenn Reilly, with a PMH below who presents today for hypertension clinic evaluation.  He had previously been taking metoprolol, which Glenn. Gwenlyn Reilly stopped because of shortness of breath.  Patient reports SOB has not diminished with the discontinuation of metoprolol  Cardiac Hx: CABG x 2 in 2015; left carotid endarterectomy in 2011  Social Hx: former smoker, quit 1980, does use chewing tobacco; no alcohol; he is here today with his daughter  Home BP readings: patient brings in a list of home BP readings.  The highest was 448 systolic, with most running between 110 and 130.  Diastolic readings were all WNL.  He states that he has taken his blood pressure cuff to his primary MD office several times to compare with office readings and it has been accurate.  Patient and daughter both state that he is a very impatient person, and when he has to wait in the lobby his blood pressure usually goes up significantly.    Current antihypertensive medications: lisinopril 5mg  (note this was changed later in the same day to valsartan 80 mg, by Glenn Reilly due to chance that ACEI can worsen breathing problems)   Current Outpatient Prescriptions  Medication Sig Dispense Refill  . albuterol (PROVENTIL HFA;VENTOLIN HFA) 108 (90 BASE) MCG/ACT inhaler Inhale 2 puffs into the lungs every 6 (six) hours as needed for wheezing or shortness of breath.    Marland Kitchen aspirin EC 325 MG EC tablet Take 1 tablet (325 mg total) by mouth daily. 30 tablet 0  . famotidine (PEPCID) 20 MG tablet One at bedtime 30 tablet 2  . furosemide (LASIX) 20 MG tablet 40mg  on Monday, Wednesday, and Friday, 20mg  all other days. 30 tablet 0  . ipratropium (ATROVENT) 0.03 % nasal spray     . ipratropium-albuterol (DUONEB) 0.5-2.5 (3) MG/3ML SOLN Take 3 mLs by nebulization 3 (three) times daily.    Marland Kitchen levothyroxine (SYNTHROID, LEVOTHROID) 88 MCG tablet  Take 88 mcg by mouth daily before breakfast.    . lisinopril (PRINIVIL,ZESTRIL) 5 MG tablet Take 5 mg by mouth daily.    . pantoprazole (PROTONIX) 40 MG tablet Take 1 tablet (40 mg total) by mouth daily. Take 30-60 min before first meal of the day 30 tablet 2  . predniSONE (DELTASONE) 10 MG tablet Take 10 mg by mouth daily.    . Red Yeast Rice 600 MG CAPS Take 1 capsule by mouth 2 (two) times daily.    . valsartan (DIOVAN) 80 MG tablet Take 1 tablet (80 mg total) by mouth daily. 30 tablet 11   No current facility-administered medications for this visit.    Allergies  Allergen Reactions  . Demerol [Meperidine] Other (See Comments)    shock    Past Medical History  Diagnosis Date  . PVD (peripheral vascular disease) 2011    carotid endarterectomy, pv stents  . Stroke 07/2009    left carotid endarterectomy  . HTN (hypertension)   . Hyperlipemia   . Carotid artery disease   . Detached retina   . Cataract   . COPD (chronic obstructive pulmonary disease)   . Asbestosis   . CAD, [place] PCI 2004 Cath 11/05/2013 with 50% LMain, 75% prox LAD, 80% prox LCx, 50% RCA with normal EF    . 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 Lung mass 10/19/2013    CT of the chest 10/12/2013 reveals an enlarging left upper lobe irregular nodule. Now 1.4 compared to 1.0 cm in diameter when compared to March 2015 CT scan. There is calcified pleural plaques seen bilaterally. There is subpleural scarring seen as well. Previous right lower lobectomy has been demonstrated.  PET-CT scan from June 2015 is reviewed this shows increased uptake (SUV > 8) in left upper   . HYPERLIPIDEMIA-MIXED 01/13/2010    Qualifier: Diagnosis of  By: Glenn Perches, MD, Glenn Reilly   . COPD with emphysema 01/13/2010    Spirometry 10/19/2013 is reviewed. This reveals  FEV1 44% predicted FVC 47% predicted and FEF 25 7537% predicted   . PVD 01/13/2010    Qualifier: Diagnosis of  By: Glenn Perches, MD, Glenn Reilly   . HYPERTENSION, BENIGN 01/13/2010    Qualifier: Diagnosis of  By: Glenn Perches, MD, Glenn Reilly   . Asthma   . Hypothyroidism   . Factor 5 Leiden mutation, heterozygous   . Complication of anesthesia     lt side throat parlysis  . Dysphagia as late effect of stroke     Developed as complication of stroke following left carotid endarterectomy with documented left hypoglossal nerve paralysis   . S/P CABG x 2 11/20/2013    Off-pump CABG x2 using LIMA to LAD, SVG to OM1, EVH via right thigh  . Radiation 9/1, 9/3, 01/19/14    Left upper lung 3 fractions  . Lung cancer 2001    lower right lobe surgery to remove cancer, no chemo or radiation    Blood pressure 200/100, height 5\' 8"  (1.727 m), weight 147 lb 1.6 oz (66.724 kg).     Glenn Reilly PharmD CPP [facility]

## 2014-05-13 DIAGNOSIS — E785 Hyperlipidemia, unspecified: Secondary | ICD-10-CM | POA: Diagnosis not present

## 2014-05-13 DIAGNOSIS — D649 Anemia, unspecified: Secondary | ICD-10-CM | POA: Diagnosis not present

## 2014-05-17 DIAGNOSIS — J0100 Acute maxillary sinusitis, unspecified: Secondary | ICD-10-CM | POA: Diagnosis not present

## 2014-05-17 DIAGNOSIS — D049 Carcinoma in situ of skin, unspecified: Secondary | ICD-10-CM | POA: Diagnosis not present

## 2014-05-17 DIAGNOSIS — E785 Hyperlipidemia, unspecified: Secondary | ICD-10-CM | POA: Diagnosis not present

## 2014-05-19 ENCOUNTER — Ambulatory Visit: Payer: Medicare Other | Admitting: Internal Medicine

## 2014-05-20 DIAGNOSIS — Z1212 Encounter for screening for malignant neoplasm of rectum: Secondary | ICD-10-CM | POA: Diagnosis not present

## 2014-05-24 ENCOUNTER — Encounter: Payer: Self-pay | Admitting: Internal Medicine

## 2014-05-24 ENCOUNTER — Ambulatory Visit (INDEPENDENT_AMBULATORY_CARE_PROVIDER_SITE_OTHER): Payer: Medicare Other | Admitting: Internal Medicine

## 2014-05-24 DIAGNOSIS — R0600 Dyspnea, unspecified: Secondary | ICD-10-CM

## 2014-05-24 DIAGNOSIS — Z85118 Personal history of other malignant neoplasm of bronchus and lung: Secondary | ICD-10-CM

## 2014-05-24 NOTE — Patient Instructions (Addendum)
Continue diovan 80 mg daily and reduce the prednisone to 5 mg daily   Pace yourself slower at home for now  Add Pepcid 20 mg at bedtime automatically with chlortrimeton 4 mg x 2 and you should sleep great without choking as long as your are following the diet we gave you   For drainage ok to  take chlortrimeton (chlorpheniramine) 4 mg every 4 hours available over the counter (may cause drowsiness)  Please schedule a follow up office visit in 4 weeks, sooner if needed  And we will order ct sinus and chest angiogram

## 2014-05-24 NOTE — Progress Notes (Signed)
Subjective:   Patient ID: KAY RICCIUTI, male    DOB: 1928/12/25    MRN: [id number]    Brief patient profile:    End of Treatment Note Diagnosis: Stage I NSCLC of the left upper lobe  Indication for treatment: Curative  Radiation treatment dates: 01/12/2014, 01/14/2014, 01/19/2014 Site/dose: Left upper lobe/ 54 gy in 3 fractions at 18 Gy per fraction  02/02/2014 Chief Complaint  Patient presents with  . Follow-up    had last radiation tx last wk,open hrt. surgery 7 wks. ago,sob worse with exertion,cough-dry,no wheezing,mid-chest tightness,no fcs  Finished XRT, last Rx one week ago.  Pt notes not able to much of anything. Pt notes some pndrip, choking with this.   S/p CABG.   Sats ok on RA.  Now in cardiac rehab at [place].  rec Try atrovent nasal two puff each nostril twice daily     04/21/2014 f/u ov/Merek Niu re: sob worse since finished RT  Chief Complaint  Patient presents with  . Acute Visit    breathing is getting worse, a lot of SOB; left upper lobe rales per Dr. Roxy Manns; CXR done yesterday.    walking was doing better  p cabg to point where up incline to house s stopping s 02 s a problem but gradually worse since mid way through RT (around 3 m prior to OV   At hs choking sensation but no excess mucus/ constant sensation of pnds / cough and throat clearing but no excess mucus, no better with atrovent ns rec Stop lisinopril indefinitely start diovan 80 mg one daily  Pantoprazole (protonix) 40 mg   Take 30-60 min before first meal of the day and Pepcid 20 mg one bedtime until return to office   GERD  Diet  Prednisone 10 mg 2 until better then reduce to 10 mg daily    05/24/2014 f/u ov/Nancee Brownrigg re: doe  Unexplained/ cough off acei x 2  weeks Chief Complaint  Patient presents with  . Shortness of Breath    Breathing is unchanged since last OV. Reports SOB, chest tightness and coughing. Cough is dry.  using neb first thing am 530   and then again around 810 am (around  2 h prior to OV ) on prednisone 10 because 20 mg daily made no difference Does feel that saba and neb make a difference Did not start pepcid as rec and still has hs choking sensation   No obvious day to day or daytime variabilty or assoc excess or purulent mucus or cp or chest tightness, subjective wheeze overt sinus or hb symptoms. No unusual exp hx or h/o childhood pna/ asthma or knowledge of premature birth.  Sleeping ok without nocturnal  or early am exacerbation  of respiratory  c/o's or need for noct saba. Also denies any obvious fluctuation of symptoms with weather or environmental changes or other aggravating or alleviating factors except as outlined above   Current Medications, Allergies, Complete Past Medical History, Past Surgical History, Family History, and Social History were reviewed in [employer] record.  ROS  The following are not active complaints unless bolded sore throat, dysphagia, dental problems, itching, sneezing,  nasal congestion or excess/ purulent secretions, ear ache,   fever, chills, sweats, unintended wt loss, pleuritic or exertional cp, hemoptysis,  orthopnea pnd or leg swelling, presyncope, palpitations, heartburn, abdominal pain, anorexia, nausea, vomiting, diarrhea  or change in bowel or urinary habits, change in stools or urine, dysuria,hematuria,  rash, arthralgias, visual complaints, headache,  numbness weakness or ataxia or problems with walking or coordination,  change in mood/affect or memory.                 Objective:   Physical Exam  05/24/2014       147  Wt Readings from Last 3 Encounters:  04/21/14 148 lb 9.6 oz (67.405 kg)  04/19/14 148 lb (67.132 kg)  03/09/14 146 lb (66.225 kg)    Vital signs reviewed   Gen: amb wm nad / mod hoarse, no longer pseudowheeze   ENT: No lesions,  mouth clear,  oropharynx clear, no postnasal drip  Neck: No JVD, no TMG, no carotid bruits  Lungs: No use of accessory muscles, no  dullness to percussion, distant breath sounds Median sternotomy is healing  Cardiovascular: RRR, heart sounds normal, no murmur or gallops, no peripheral edema  Abdomen: soft and NT, no HSM,  BS normal  Musculoskeletal: No deformities, no cyanosis or clubbing  Neuro: alert, non focal  Skin: Warm, no lesions or rashes      Recent Labs Lab 04/21/14 1604  NA 139  K 3.8  CL 103  CO2 28  BUN 32*  CREATININE 1.5  GLUCOSE 95    Recent Labs Lab 04/21/14 1604  HGB 10.9*  HCT 34.7*  WBC 6.9  PLT 264.0     Lab Results  Component Value Date   TSH 0.98 04/21/2014     Lab Results  Component Value Date   PROBNP 140.0* 04/21/2014     Lab Results  Component Value Date   ESRSEDRATE 24* 04/21/2014      04/20/14  No acute change      Assessment & Plan:

## 2014-05-27 ENCOUNTER — Encounter: Payer: Self-pay | Admitting: Internal Medicine

## 2014-05-27 NOTE — Assessment & Plan Note (Addendum)
04/21/2014  Walked RA x 3 laps @ 185 ft each stopped due to min sob, nl pace, no desat  Trial off acei 04/21/2014 >>>  - spirometry 05/24/2014 FEV1  1.07(37%) with ratio 72 1.5 h p last saba  -05/24/2014  Walked RA x 3 laps @ 185 ft each stopped due to end of study, mild sob, mod to fast pace no desat    Severe symptoms including sob at rest and choking at hs remain   markedly disproportionate to objective findings and not clear this is a lung problem but pt does appear to have difficult airway management issues. DDX of  difficult airways management all start with A and  include Adherence, Ace Inhibitors, Acid Reflux, Active Sinus Disease, Alpha 1 Antitripsin deficiency, Anxiety masquerading as Airways dz,  ABPA,  allergy(esp in young), Aspiration (esp in elderly), Adverse effects of DPI,  Active smokers, A lot  of PE's (one or two won't do this),  plus two Bs  = Bronchiectasis and Beta blocker use..and one C= CHF   Adherence is always the initial "prime suspect" and is a multilayered concern that requires a "trust but verify" approach in every patient - starting with knowing how to use medications, especially inhalers, correctly, keeping up with refills and understanding the fundamental difference between maintenance and prns vs those medications only taken for a very short course and then stopped and not refilled.  - not using pepcid at hs as rec  ACEi off only 2 weeks, too soon to call  ? Acid (or non-acid) GERD > always difficult to exclude as up to 75% of pts in some series report no assoc GI/ Heartburn symptoms> rec max (24h)  acid suppression and diet restrictions/ reviewed and instructions given in writing.   Allergies, pnds > add 1st gen h1 per guidelines/ will continue to taper off pred as made no difference  Anxiety/ depression usually dx of exclusion but much higher on his list  ? Active sinus dz > doubt but with noct choking may need to r/o  ? A bunch of PE unlikely with such good ex  tol here but CTa next step if not improving   See instructions for specific recommendations which were reviewed directly with the patient who was given a copy with highlighter outlining the key components.

## 2014-06-01 DIAGNOSIS — D509 Iron deficiency anemia, unspecified: Secondary | ICD-10-CM | POA: Diagnosis not present

## 2014-06-01 DIAGNOSIS — Z951 Presence of aortocoronary bypass graft: Secondary | ICD-10-CM | POA: Diagnosis not present

## 2014-06-01 DIAGNOSIS — N289 Disorder of kidney and ureter, unspecified: Secondary | ICD-10-CM | POA: Diagnosis not present

## 2014-06-01 DIAGNOSIS — C3412 Malignant neoplasm of upper lobe, left bronchus or lung: Secondary | ICD-10-CM | POA: Diagnosis not present

## 2014-06-08 DIAGNOSIS — L570 Actinic keratosis: Secondary | ICD-10-CM | POA: Diagnosis not present

## 2014-06-08 DIAGNOSIS — D0461 Carcinoma in situ of skin of right upper limb, including shoulder: Secondary | ICD-10-CM | POA: Diagnosis not present

## 2014-06-21 ENCOUNTER — Encounter: Payer: Self-pay | Admitting: Internal Medicine

## 2014-06-21 ENCOUNTER — Ambulatory Visit (INDEPENDENT_AMBULATORY_CARE_PROVIDER_SITE_OTHER): Payer: Medicare Other | Admitting: Internal Medicine

## 2014-06-21 VITALS — BP 138/82 | HR 70 | Ht 68.0 in | Wt 149.0 lb

## 2014-06-21 DIAGNOSIS — R0600 Dyspnea, unspecified: Secondary | ICD-10-CM | POA: Diagnosis not present

## 2014-06-21 NOTE — Assessment & Plan Note (Signed)
04/21/2014  Walked RA x 3 laps @ 185 ft each stopped due to min sob, nl pace, no desat  Trial off acei 04/21/2014 >>>  - spirometry 05/24/2014 FEV1  1.07(37%) with ratio 72 1.5 h p last saba  -05/24/2014  Walked RA x 3 laps @ 185 ft each stopped due to end of study, mild sob, mod to fast pace no desat   I had an extended discussion with the patient reviewing all relevant studies completed to date and  lasting 15 to 20 minutes of a 25 minute visit on the following ongoing concerns:  1) symptoms/ saba dep strongly suggest an asthmatic component superimposed on severe restriction from prev surgery/RT  2) no evidence of sign copd so reluctant to start back on lama  3) excessive saba [place] risks tachyphylaxis  4) trial of duler 200 2bid warranted then return here s am saba to recheck spirometry  5) in meantime will continue rx for upper airway > See instructions for specific recommendations which were reviewed directly with the patient who was given a copy with highlighter outlining the key components.

## 2014-06-21 NOTE — Progress Notes (Signed)
Subjective:   [name], male    DOB: 1928/06/18    MRN: [id number]    Brief patient profile:    End of Treatment Note Diagnosis: Stage I NSCLC of the left upper lobe  Indication for treatment: Curative  Radiation treatment dates: 01/12/2014, 01/14/2014, 01/19/2014 Site/dose: Left upper lobe/ 54 gy in 3 fractions at 18 Gy per fraction  02/02/2014 Chief Complaint  Patient presents with  . Follow-up    had last radiation tx last wk,open hrt. surgery 7 wks. ago,sob worse with exertion,cough-dry,no wheezing,mid-chest tightness,no fcs  Finished XRT, last Rx one week ago.  Pt notes not able to much of anything. Pt notes some pndrip, choking with this.   S/p CABG.   Sats ok on RA.  Now in cardiac rehab at [place].  rec Try atrovent nasal two puff each nostril twice daily     04/21/2014 f/u ov/Glenn Reilly re: sob worse since finished RT  Chief Complaint  Patient presents with  . Acute Visit    breathing is getting worse, a lot of SOB; left upper lobe rales per Dr. Roxy Manns; CXR done yesterday.    walking was doing better  p cabg to point where up incline to house s stopping s 02 s a problem but gradually worse since mid way through RT (around 3 m prior to OV   At hs choking sensation but no excess mucus/ constant sensation of pnds / cough and throat clearing but no excess mucus, no better with atrovent ns rec Stop lisinopril indefinitely start diovan 80 mg one daily  Pantoprazole (protonix) 40 mg   Take 30-60 min before first meal of the day and Pepcid 20 mg one bedtime until return to office   GERD  Diet  Prednisone 10 mg 2 until better then reduce to 10 mg daily    05/24/2014 f/u ov/Glenn Reilly re: doe  Unexplained/ cough off acei x 2  weeks Chief Complaint  Patient presents with  . Shortness of Breath    Breathing is unchanged since last OV. Reports SOB, chest tightness and coughing. Cough is dry.  using neb first thing am 530   and then again around 810 am (around  2 h prior to OV ) on prednisone 10 because 20 mg daily made no difference Does feel that saba and neb make a difference Did not start pepcid as rec and still has hs choking sensation  rec Continue diovan 80 mg daily and reduce the prednisone to 5 mg daily  Pace yourself slower at home for now Add Pepcid 20 mg at bedtime automatically with chlortrimeton 4 mg x 2 and you should sleep great without choking as long as your are following the diet we gave you  rec Continue diovan 80 mg daily and reduce the prednisone to 5 mg daily  Pace yourself slower at home for now Add Pepcid 20 mg at bedtime automatically with chlortrimeton 4 mg x 2 and you should sleep great without choking as long as your are following the diet we gave you  For drainage ok to  take chlortrimeton (chlorpheniramine) 4 mg every 4 hours available over the counter (may cause drowsiness) Please schedule a follow up office visit in 4 weeks, sooner if needed  And we will order ct sinus and chest angiogram      06/21/2014 f/u ov/Glenn Reilly re: unexplained sob/ saba dep  Chief Complaint  Patient presents with  . Follow-up    Pt states that his breathing is  unchanged since the last visit. His cough is some better. No new co's today. He is using albuterol inhaler about every 2 hours.   outside 4-6 hours per day and using albuterol every 2 hours when exerting but otherwise doesn't use it eg "sundays when stays inside  Never uses saba at all during the day if at rest Every night when lies down > 10 min p supine even p h1/h2 still gets sob and needs saba neb  Prednisone 10 worked better than pred 5 mg but 20 worked no better than 10 so left dose at 10 mg daily      No obvious day to day or daytime variabilty or assoc excess or purulent mucus or cp or chest tightness, subjective wheeze overt sinus or hb symptoms. No unusual exp hx or h/o childhood pna/ asthma or knowledge of premature birth.  Sleeping ok without nocturnal  or early am  exacerbation  of respiratory  c/o's or need for noct saba. Also denies any obvious fluctuation of symptoms with weather or environmental changes or other aggravating or alleviating factors except as outlined above   Current Medications, Allergies, Complete Past Medical History, Past Surgical History, Family History, and Social History were reviewed in [facility] electronic medical record.  ROS  The following are not active complaints unless bolded sore throat, dysphagia, dental problems, itching, sneezing,  nasal congestion or excess/ purulent secretions, ear ache,   fever, chills, sweats, unintended wt loss, pleuritic or exertional cp, hemoptysis,  orthopnea pnd or leg swelling, presyncope, palpitations, heartburn, abdominal pain, anorexia, nausea, vomiting, diarrhea  or change in bowel or urinary habits, change in stools or urine, dysuria,hematuria,  rash, arthralgias, visual complaints, headache, numbness weakness or ataxia or problems with walking or coordination,  change in mood/affect or memory.                 Objective:   Physical Exam  05/24/2014       147 >     06/21/2014    149  Wt Readings from Last 3 Encounters:  04/21/14 148 lb 9.6 oz (67.405 kg)  04/19/14 148 lb (67.132 kg)  03/09/14 146 lb (66.225 kg)    Vital signs reviewed   Gen: amb anxious  wm nad / mod hoarse, no longer pseudowheeze   ENT: No lesions,  mouth clear,  oropharynx clear, no postnasal drip  Neck: No JVD, no TMG, no carotid bruits  Lungs: No use of accessory muscles, no dullness to percussion, distant breath sounds Median sternotomy is healing  Cardiovascular: RRR, heart sounds normal, no murmur or gallops, no peripheral edema  Abdomen: soft and NT, no HSM,  BS normal  Musculoskeletal: No deformities, no cyanosis or clubbing  Neuro: alert, non focal  Skin: Warm, no lesions or rashes      Recent Labs Lab 04/21/14 1604  NA 139  K 3.8  CL 103  CO2 28  BUN 32*  CREATININE 1.5    GLUCOSE 95    Recent Labs Lab 04/21/14 1604  HGB 10.9*  HCT 34.7*  WBC 6.9  PLT 264.0     Lab Results  Component Value Date   TSH 0.98 04/21/2014     Lab Results  Component Value Date   PROBNP 140.0* 04/21/2014     Lab Results  Component Value Date   ESRSEDRATE 24* 04/21/2014      12" /8/15  No acute change      Assessment & Plan:

## 2014-06-21 NOTE — Patient Instructions (Addendum)
Plan A =automatic = dulera 200 Take 2 puffs first thing in am and then another 2 puffs about 12 hours later.   Work on Engineer, technical sales technique:  relax and gently blow all the way out then take a nice smooth deep breath back in, triggering the inhaler at same time you start breathing in.  Hold for up to 5 seconds if you can.  Rinse and gargle with water when done      Plan B = Backup  - Only use your albuterol as a rescue medication to be used if you can't catch your breath by resting or doing a relaxed purse lip breathing pattern.  - The less you use it, the better it will work when you need it. - Ok to use up to 2 puffs  every 4 hours if you must but call for immediate appointment if use goes up over your usual need - Don't leave home without it !!  (think of it like the spare tire for your car)   Plan C = Crisis - duoneb up to every 4 hours but eventually hope to not need it all   In meantime Pantoprazole (protonix) 40 mg   Take 30-60 min before first meal of the day and Pepcid 20 mg one bedtime  And 2 chlorpheniramine  Please schedule a follow up office visit in 2 weeks, sooner if needed  - bring all inhalers and your nebulizer solution with you and don't use Plan B or C w/in 4 hours of visit if at all possible  Late add spirometry on return

## 2014-06-29 ENCOUNTER — Telehealth: Payer: Self-pay | Admitting: Cardiovascular Disease

## 2014-06-29 NOTE — Telephone Encounter (Signed)
Received records from [facility] for appointment with Dr Gwenlyn Found on 07/27/14.  Records given to [facility] (medical records) for Dr Kennon Holter schedule on 07/27/14. lp

## 2014-07-01 DIAGNOSIS — J441 Chronic obstructive pulmonary disease with (acute) exacerbation: Secondary | ICD-10-CM | POA: Diagnosis not present

## 2014-07-05 ENCOUNTER — Ambulatory Visit: Payer: Medicare Other | Admitting: Internal Medicine

## 2014-07-05 DIAGNOSIS — J449 Chronic obstructive pulmonary disease, unspecified: Secondary | ICD-10-CM | POA: Diagnosis not present

## 2014-07-05 DIAGNOSIS — J40 Bronchitis, not specified as acute or chronic: Secondary | ICD-10-CM | POA: Diagnosis not present

## 2014-07-06 DIAGNOSIS — J189 Pneumonia, unspecified organism: Secondary | ICD-10-CM | POA: Diagnosis not present

## 2014-07-06 DIAGNOSIS — R0602 Shortness of breath: Secondary | ICD-10-CM | POA: Diagnosis not present

## 2014-07-06 DIAGNOSIS — J984 Other disorders of lung: Secondary | ICD-10-CM | POA: Diagnosis not present

## 2014-07-06 DIAGNOSIS — D649 Anemia, unspecified: Secondary | ICD-10-CM | POA: Diagnosis not present

## 2014-07-06 DIAGNOSIS — R0689 Other abnormalities of breathing: Secondary | ICD-10-CM | POA: Diagnosis not present

## 2014-07-06 DIAGNOSIS — I25810 Atherosclerosis of coronary artery bypass graft(s) without angina pectoris: Secondary | ICD-10-CM | POA: Diagnosis present

## 2014-07-06 DIAGNOSIS — I10 Essential (primary) hypertension: Secondary | ICD-10-CM | POA: Diagnosis present

## 2014-07-06 DIAGNOSIS — J45909 Unspecified asthma, uncomplicated: Secondary | ICD-10-CM | POA: Diagnosis present

## 2014-07-06 DIAGNOSIS — Z886 Allergy status to analgesic agent status: Secondary | ICD-10-CM | POA: Diagnosis not present

## 2014-07-06 DIAGNOSIS — Z8673 Personal history of transient ischemic attack (TIA), and cerebral infarction without residual deficits: Secondary | ICD-10-CM | POA: Diagnosis not present

## 2014-07-06 DIAGNOSIS — E039 Hypothyroidism, unspecified: Secondary | ICD-10-CM | POA: Diagnosis not present

## 2014-07-06 DIAGNOSIS — I509 Heart failure, unspecified: Secondary | ICD-10-CM | POA: Diagnosis not present

## 2014-07-06 DIAGNOSIS — Z85118 Personal history of other malignant neoplasm of bronchus and lung: Secondary | ICD-10-CM | POA: Diagnosis not present

## 2014-07-06 DIAGNOSIS — Z95818 Presence of other cardiac implants and grafts: Secondary | ICD-10-CM | POA: Diagnosis not present

## 2014-07-06 DIAGNOSIS — I2510 Atherosclerotic heart disease of native coronary artery without angina pectoris: Secondary | ICD-10-CM | POA: Diagnosis not present

## 2014-07-06 DIAGNOSIS — J431 Panlobular emphysema: Secondary | ICD-10-CM | POA: Diagnosis not present

## 2014-07-06 DIAGNOSIS — Z87891 Personal history of nicotine dependence: Secondary | ICD-10-CM | POA: Diagnosis not present

## 2014-07-06 DIAGNOSIS — Z888 Allergy status to other drugs, medicaments and biological substances status: Secondary | ICD-10-CM | POA: Diagnosis not present

## 2014-07-06 DIAGNOSIS — J441 Chronic obstructive pulmonary disease with (acute) exacerbation: Secondary | ICD-10-CM | POA: Diagnosis not present

## 2014-07-12 ENCOUNTER — Ambulatory Visit: Payer: Medicare Other | Admitting: Internal Medicine

## 2014-07-13 DIAGNOSIS — Z87891 Personal history of nicotine dependence: Secondary | ICD-10-CM | POA: Diagnosis not present

## 2014-07-13 DIAGNOSIS — Z7982 Long term (current) use of aspirin: Secondary | ICD-10-CM | POA: Diagnosis not present

## 2014-07-13 DIAGNOSIS — I509 Heart failure, unspecified: Secondary | ICD-10-CM | POA: Diagnosis not present

## 2014-07-13 DIAGNOSIS — J45909 Unspecified asthma, uncomplicated: Secondary | ICD-10-CM | POA: Diagnosis not present

## 2014-07-13 DIAGNOSIS — J449 Chronic obstructive pulmonary disease, unspecified: Secondary | ICD-10-CM | POA: Diagnosis not present

## 2014-07-13 DIAGNOSIS — I10 Essential (primary) hypertension: Secondary | ICD-10-CM | POA: Diagnosis not present

## 2014-07-13 DIAGNOSIS — R0602 Shortness of breath: Secondary | ICD-10-CM | POA: Diagnosis not present

## 2014-07-16 ENCOUNTER — Encounter: Payer: Self-pay | Admitting: Cardiovascular Disease

## 2014-07-16 ENCOUNTER — Ambulatory Visit (INDEPENDENT_AMBULATORY_CARE_PROVIDER_SITE_OTHER): Payer: Medicare Other | Admitting: Cardiovascular Disease

## 2014-07-16 VITALS — BP 148/60 | HR 80 | Ht 68.0 in | Wt 138.7 lb

## 2014-07-16 DIAGNOSIS — R0600 Dyspnea, unspecified: Secondary | ICD-10-CM | POA: Diagnosis not present

## 2014-07-16 DIAGNOSIS — I739 Peripheral vascular disease, unspecified: Secondary | ICD-10-CM

## 2014-07-16 DIAGNOSIS — I779 Disorder of arteries and arterioles, unspecified: Secondary | ICD-10-CM | POA: Diagnosis not present

## 2014-07-16 DIAGNOSIS — I2510 Atherosclerotic heart disease of native coronary artery without angina pectoris: Secondary | ICD-10-CM | POA: Diagnosis not present

## 2014-07-16 DIAGNOSIS — Z951 Presence of aortocoronary bypass graft: Secondary | ICD-10-CM

## 2014-07-16 DIAGNOSIS — I519 Heart disease, unspecified: Secondary | ICD-10-CM | POA: Diagnosis not present

## 2014-07-16 DIAGNOSIS — I10 Essential (primary) hypertension: Secondary | ICD-10-CM | POA: Diagnosis not present

## 2014-07-16 NOTE — Progress Notes (Signed)
07/16/2014 Glenn Reilly   Oct 24, 1928  [id number]  Primary Physician Townsend Roger, MD Primary Cardiologist: Lorretta Harp MD Renae Gloss   HPI::  Glenn Reilly is an 81 year old thin appearing widowed Caucasian male father of one son who accompanies him today. I last saw him in the office 03/09/14. He was referred by Dr. Vassie Loll Eyk His cardiac risk factor profile is remarkable for remote tobacco abuse, hyperlipidemia and hypertension. He has never had a heart attack but did have a stroke in 2011 at the time of left carotid endarterectomy. He said coronary artery disease with stenting in 1999 performed by Dr. Eustace Quail and again 2004. His left calf because k patent stent with moderate LAD and circumflex disease. He does have diastolic dysfunction. He has had lung cancer and right lung resection by Dr. Baldemar Friday in 2001. On Thursday night after mowing his lawn on a riding lower developed back pain rating to his chest associated with dyspnea on exertion. This resolved throughout the night has not recurred. He did have apparently inferior T-wave inversion on EKG today which was new. A Myoview stress test was normal as was a 2-D echo echocardiogram. Lower extremity arterial Doppler studies showed ankle-brachial indices of greater than one bilaterally with a suggestion of mild to moderate iliac disease. He developed exertional left upper extremity discomfort with dyspnea over the last month which is fairly reproducible and similar to his prior symptoms before coronary intervention. Based on this, I performed outpatient cardiac catheterization on 11/05/13 in the right radial approach. I demonstrated 50% left main, high-grade proximal LAD, ostial circumflex and moderate mid RCA disease with normal LV function. Based on this, I suggested coronary artery bypass grafting is the best revascularization strategy especially in light of his pulmonary nodule which could potentially be biopsied  at the time of surgery. This was performed by Dr. Roxy Manns on 11/20/13 with a LIMA to his LAD and a vein to the circumflex coronary artery. He also had a Tru-Cut needle biopsy of his left upper lobe nodule which turned out to be some squamous cell carcinoma. He has received radiation therapy for this. He did participate in cardiac rehabilitation. Recent chest CT showed diminished size of his nodule. She was recently hospitalized ran out of hospital for pneumonia and was told he had "congestive heart failure. A BNP was mildly elevated. He did have lower extremity edema and his Lasix was increased     Current Outpatient Prescriptions  Medication Sig Dispense Refill  . albuterol (PROVENTIL HFA;VENTOLIN HFA) 108 (90 BASE) MCG/ACT inhaler Inhale 2 puffs into the lungs every 6 (six) hours as needed for wheezing or shortness of breath.    Marland Kitchen aspirin 81 MG tablet Take 81 mg by mouth daily.    . famotidine (PEPCID) 20 MG tablet One at bedtime 30 tablet 2  . furosemide (LASIX) 20 MG tablet 40mg  on Monday, Wednesday, and Friday, 20mg  all other days. (Patient taking differently: Take 40 mg by mouth daily. ) 30 tablet 0  . ipratropium (ATROVENT) 0.03 % nasal spray   12  . ipratropium-albuterol (DUONEB) 0.5-2.5 (3) MG/3ML SOLN Take 3 mLs by nebulization 3 (three) times daily.    Marland Kitchen levothyroxine (SYNTHROID, LEVOTHROID) 88 MCG tablet Take 88 mcg by mouth daily before breakfast.    . nystatin (MYCOSTATIN) 100000 UNIT/ML suspension     . pantoprazole (PROTONIX) 40 MG tablet Take 1 tablet (40 mg total) by mouth daily. Take 30-60 min before first meal  of the day 30 tablet 2  . predniSONE (DELTASONE) 10 MG tablet Take 10 mg by mouth daily.    . Red Yeast Rice 600 MG CAPS Take 1 capsule by mouth 2 (two) times daily.    . valsartan (DIOVAN) 80 MG tablet Take 1 tablet (80 mg total) by mouth daily. 30 tablet 11   No current facility-administered medications for this visit.    Allergies  Allergen Reactions  . Demerol  [Meperidine] Other (See Comments)    shock  . Statins Other (See Comments)    History   Social History  . Marital Status: Widowed    Spouse Name: N/A  . Number of Children: 1  . Years of Education: N/A   Occupational History  . 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 Smoking status: Former Smoker -- 1.00 packs/day for 30 years    Types: Cigarettes    Quit date: 03/03/1979  . Smokeless tobacco: Current User    Types: Chew  . Alcohol Use: No  . Drug Use: No  . Sexual Activity: Not on file   Other Topics Concern  . Not on file   Social History Narrative     Review of Systems: General: negative for chills, fever, night sweats or weight changes.  Cardiovascular: negative for chest pain, dyspnea on exertion, edema, orthopnea, palpitations, paroxysmal nocturnal dyspnea or shortness of breath Dermatological: negative for rash Respiratory: negative for cough or wheezing Urologic: negative for hematuria Abdominal: negative for nausea, vomiting, diarrhea, bright red blood per rectum, melena, or hematemesis Neurologic: negative for visual changes, syncope, or dizziness All other systems reviewed and are otherwise negative except as noted above.    Blood pressure 148/60, pulse 80, height 5\' 8"  (1.727 m), weight 138 lb 11.2 oz (62.914 kg).  General appearance: alert and no distress Neck: no adenopathy, no carotid bruit, no JVD, supple, symmetrical, trachea midline and thyroid not enlarged, symmetric, no tenderness/mass/nodules Lungs: clear to auscultation bilaterally Heart: regular rate and rhythm, S1, S2 normal, no murmur, click, rub or gallop Extremities: extremities normal, atraumatic, no cyanosis or edema  EKG sinus rhythm at 80 with evidence of LVH with repolarization changes. This does represent a change from prior EKGs. I personally reviewed this EKG  ASSESSMENT AND PLAN:   S/P Off-pump CABG x 2 History of CAD status  post off-pump coronary artery bypass grafting by Dr. Roxy Manns 11/21/10 the LIMA to his LAD and a vein to the circumflex coronary artery.he denies chest pain   HYPERTENSION, BENIGN History of hypertension with blood pressure measured today at 148/60. He is on valsartan 80 mg a day. Continue current meds at current dosing   HYPERLIPIDEMIA-MIXED History of hyperlipidemia on red yeast rice followed by his PCP   Dyspnea History of dyspnea on exertion probably related to COPD   Carotid artery disease History of carotid artery disease status post left carotid endarterectomy in the past followed by duplex ultrasound       Lorretta Harp MD [facility], [facility] 07/16/2014 1:45 PM

## 2014-07-16 NOTE — Assessment & Plan Note (Signed)
History of carotid artery disease status post left carotid endarterectomy in the past followed by duplex ultrasound

## 2014-07-16 NOTE — Assessment & Plan Note (Signed)
History of dyspnea on exertion probably related to COPD

## 2014-07-16 NOTE — Assessment & Plan Note (Addendum)
History of hyperlipidemia on red yeast rice followed by his PCP

## 2014-07-16 NOTE — Assessment & Plan Note (Signed)
History of hypertension with blood pressure measured today at 148/60. He is on valsartan 80 mg a day. Continue current meds at current dosing

## 2014-07-16 NOTE — Assessment & Plan Note (Signed)
History of CAD status post off-pump coronary artery bypass grafting by Dr. Roxy Manns 11/21/10 the LIMA to his LAD and a vein to the circumflex coronary artery.he denies chest pain

## 2014-07-16 NOTE — Patient Instructions (Signed)
Echocardiogram. Echocardiography is a painless test that uses sound waves to create images of your heart. It provides your doctor with information about the size and shape of your heart and how well your heart's chambers and valves are working. This procedure takes approximately one hour. There are no restrictions for this procedure.  We request that you follow-up in: 3 months with an extender and in 6 months with Dr Gwenlyn Found.  You will receive a reminder letter in the mail two months in advance. If you don't receive a letter, please call our office to schedule the follow-up appointment.

## 2014-07-17 ENCOUNTER — Other Ambulatory Visit: Payer: Self-pay | Admitting: Internal Medicine

## 2014-07-19 ENCOUNTER — Other Ambulatory Visit: Payer: Self-pay | Admitting: Internal Medicine

## 2014-07-20 ENCOUNTER — Ambulatory Visit (HOSPITAL_COMMUNITY)
Admission: RE | Admit: 2014-07-20 | Discharge: 2014-07-20 | Disposition: A | Discharge status: Home / Self Care | Payer: Medicare Other | Source: Ambulatory Visit | Attending: Cardiology | Admitting: Cardiology

## 2014-07-20 DIAGNOSIS — I519 Heart disease, unspecified: Secondary | ICD-10-CM | POA: Diagnosis not present

## 2014-07-20 DIAGNOSIS — I2510 Atherosclerotic heart disease of native coronary artery without angina pectoris: Secondary | ICD-10-CM | POA: Diagnosis not present

## 2014-07-20 NOTE — Progress Notes (Signed)
2D Echo Performed 07/20/2014    Marygrace Drought, RCS

## 2014-07-21 DIAGNOSIS — I5032 Chronic diastolic (congestive) heart failure: Secondary | ICD-10-CM | POA: Diagnosis not present

## 2014-07-27 ENCOUNTER — Ambulatory Visit: Payer: Medicare Other | Admitting: Cardiovascular Disease

## 2014-08-13 ENCOUNTER — Telehealth: Payer: Self-pay

## 2014-08-13 NOTE — Telephone Encounter (Signed)
08/13/14 Disc Received from [facility] and filed on shelf.Britt Bottom

## 2014-08-19 DIAGNOSIS — I10 Essential (primary) hypertension: Secondary | ICD-10-CM | POA: Diagnosis not present

## 2014-08-19 DIAGNOSIS — C3492 Malignant neoplasm of unspecified part of left bronchus or lung: Secondary | ICD-10-CM | POA: Diagnosis not present

## 2014-08-24 ENCOUNTER — Ambulatory Visit (HOSPITAL_COMMUNITY): Payer: Medicare Other

## 2014-08-24 DIAGNOSIS — C3412 Malignant neoplasm of upper lobe, left bronchus or lung: Secondary | ICD-10-CM | POA: Diagnosis not present

## 2014-08-24 DIAGNOSIS — C3492 Malignant neoplasm of unspecified part of left bronchus or lung: Secondary | ICD-10-CM | POA: Diagnosis not present

## 2014-08-24 DIAGNOSIS — R911 Solitary pulmonary nodule: Secondary | ICD-10-CM | POA: Diagnosis not present

## 2014-08-26 ENCOUNTER — Ambulatory Visit: Payer: Self-pay | Admitting: Radiation Oncology

## 2014-08-26 DIAGNOSIS — I509 Heart failure, unspecified: Secondary | ICD-10-CM | POA: Diagnosis not present

## 2014-08-26 DIAGNOSIS — D649 Anemia, unspecified: Secondary | ICD-10-CM | POA: Diagnosis not present

## 2014-08-26 DIAGNOSIS — J189 Pneumonia, unspecified organism: Secondary | ICD-10-CM | POA: Diagnosis not present

## 2014-08-26 DIAGNOSIS — C3412 Malignant neoplasm of upper lobe, left bronchus or lung: Secondary | ICD-10-CM | POA: Diagnosis not present

## 2014-10-07 DIAGNOSIS — C44622 Squamous cell carcinoma of skin of right upper limb, including shoulder: Secondary | ICD-10-CM | POA: Diagnosis not present

## 2014-10-07 DIAGNOSIS — L570 Actinic keratosis: Secondary | ICD-10-CM | POA: Diagnosis not present

## 2014-10-26 DIAGNOSIS — S0532XA Ocular laceration without prolapse or loss of intraocular tissue, left eye, initial encounter: Secondary | ICD-10-CM | POA: Diagnosis not present

## 2014-10-26 DIAGNOSIS — S0532XD Ocular laceration without prolapse or loss of intraocular tissue, left eye, subsequent encounter: Secondary | ICD-10-CM | POA: Diagnosis not present

## 2014-10-26 DIAGNOSIS — H2102 Hyphema, left eye: Secondary | ICD-10-CM | POA: Diagnosis not present

## 2014-10-26 DIAGNOSIS — T8522XA Displacement of intraocular lens, initial encounter: Secondary | ICD-10-CM | POA: Diagnosis not present

## 2014-11-17 DIAGNOSIS — I10 Essential (primary) hypertension: Secondary | ICD-10-CM | POA: Diagnosis not present

## 2014-11-22 ENCOUNTER — Ambulatory Visit (INDEPENDENT_AMBULATORY_CARE_PROVIDER_SITE_OTHER): Payer: Medicare Other | Admitting: Thoracic Surgery (Cardiothoracic Vascular Surgery)

## 2014-11-22 ENCOUNTER — Encounter: Payer: Self-pay | Admitting: Thoracic Surgery (Cardiothoracic Vascular Surgery)

## 2014-11-22 VITALS — BP 127/67 | HR 68 | Resp 16 | Ht 68.0 in | Wt 140.0 lb

## 2014-11-22 DIAGNOSIS — I25119 Atherosclerotic heart disease of native coronary artery with unspecified angina pectoris: Secondary | ICD-10-CM | POA: Diagnosis not present

## 2014-11-22 DIAGNOSIS — Z951 Presence of aortocoronary bypass graft: Secondary | ICD-10-CM | POA: Diagnosis not present

## 2014-11-22 NOTE — Progress Notes (Signed)
[address]       [place] [address]             [phone]     CARDIOTHORACIC SURGERY OFFICE NOTE  Referring Provider is Lorretta Harp, MD PCP is Townsend Roger, MD   HPI:  Patient returns for routine followup status post off-pump coronary artery bypass grafting x2 with core needle biopsy of left upper lobe lung mass on 11/20/2013.he was last seen in our office on 04/19/2014. Since then he has continued to remain clinically stable from a cardiac standpoint. He finished his course SBRT of for squamous cell carcinoma of the lung and continues to follow-up with his oncologist every 3 months in [place].  He has remained stable from a cardiac standpoint with the exception of one episode of acute exacerbation of chronic shortness of breath for which she was evaluated in the emergency department at [facility]. BNP level was elevated at the time and he was started on Lasix for direct therapy.  He was told that he likely had congestive heart failure that may have been exacerbated by changes in his medical therapy for COPD. Since then he was seen in follow-up by Dr. Alvester Chou on 07/16/2014. A transthoracic echocardiogram was performed demonstrating normal left ventricular systolic function with ejection fraction estimated 50-55%.  However, he was noted to have significant diastolic dysfunction.  The patient returns to our office for routine follow-up today. He reports that overall he remains clinically very stable. He states that he feels "somewhat improved" in comparison with prior to his heart surgery. He still has chronic exertional shortness of breath that is unquestionably multifactorial. The patient states that he is only slightly limited by shortness of breath and he is very comfortable at rest. He reports occasional episodes of chest tightness that is not necessarily associated with physical exertion. He attributes these episodes to his underlying COPD and uses an  inhaler periodically. Overall he states that he feels well and he reports no ongoing complaints.  Current Outpatient Prescriptions  Medication Sig Dispense Refill  . albuterol (PROVENTIL HFA;VENTOLIN HFA) 108 (90 BASE) MCG/ACT inhaler Inhale 2 puffs into the lungs every 6 (six) hours as needed for wheezing or shortness of breath.    Marland Kitchen aspirin 325 MG EC tablet Take 325 mg by mouth daily.    . furosemide (LASIX) 20 MG tablet '40mg'$  on Monday, Wednesday, and Friday, '20mg'$  all other days. (Patient taking differently: Take 40 mg by mouth daily. ) 30 tablet 0  . ipratropium (ATROVENT) 0.03 % nasal spray   12  . ipratropium-albuterol (DUONEB) 0.5-2.5 (3) MG/3ML SOLN Take 3 mLs by nebulization 3 (three) times daily.    Marland Kitchen levothyroxine (SYNTHROID, LEVOTHROID) 88 MCG tablet Take 88 mcg by mouth daily before breakfast.    . lisinopril (PRINIVIL,ZESTRIL) 20 MG tablet Take 20 mg by mouth daily.    . predniSONE (DELTASONE) 10 MG tablet Take 10 mg by mouth daily with breakfast.    . Red Yeast Rice 600 MG CAPS Take 1 capsule by mouth 2 (two) times daily.     No current facility-administered medications for this visit.      Physical Exam:   BP 127/67 mmHg  Pulse 68  Resp 16  Ht '5\' 8"'$  (1.727 m)  Wt 140 lb (63.504 kg)  BMI 21.29 kg/m2  SpO2 97%  General:  Elderly but well-appearing  Chest:   Clear to auscultation with exception of a few inspiratory crackles, no  wheezes or rhonchi noted  CV:   Regular rate and rhythm  Incisions:  Completely healed  Abdomen:  Soft and nontender  Extremities:  Warm and well-perfused  Diagnostic Tests:  Transthoracic Echocardiography  Patient:  Richardson, Dubree MR #:    [id number] Study Date: 07/20/2014 Gender:   M Age:    25 Height:   172.7 cm Weight:   62.6 kg BSA:    1.73 m^2 Pt. Status: Room:  ATTENDING  Quay Burow, MD ORDERING   Quay Burow, MD REFERRING  Quay Burow, MD SONOGRAPHER Marygrace Drought,  RCS PERFORMING  [facility], Outpatient  cc:  ------------------------------------------------------------------- LV EF: 50% -  55%  ------------------------------------------------------------------- Indications:   414.01 Coronary atherosclerosis - native artery.  ------------------------------------------------------------------- History:  PMH: Hyperlipidemia, Carotid Artery Disease, Lung Cancer. Dyspnea.  ------------------------------------------------------------------- Study Conclusions  - Left ventricle: There was mild concentric hypertrophy. Systolic function was normal. The estimated ejection fraction was in the range of 50% to 55%. Wall motion was normal; there were no regional wall motion abnormalities. Doppler parameters are consistent with abnormal left ventricular relaxation (grade 1 diastolic dysfunction). Doppler parameters are consistent with elevated ventricular end-diastolic filling pressure. - Aortic valve: Structurally normal valve. Sclerosis without stenosis. There was mild to moderate regurgitation. - Atrial septum: No defect or patent foramen ovale was identified. - Tricuspid valve: There was mild regurgitation. - Pulmonary arteries: Systolic pressure was within the normal range. - Inferior vena cava: The vessel was normal in size. The respirophasic diameter changes were in the normal range (= 50%), consistent with normal central venous pressure.  Impressions:  - Normal biventricular size and systolic function. Mild LVH. Abnormal relaxation with normal filling pressures. Mild to moderate aortic regurgitation. Mild tricuspid regurgitation. Normal RVSP.  Transthoracic echocardiography. M-mode, complete 2D, spectral Doppler, and color Doppler. Birthdate: Patient birthdate: [birth year]. Age: Patient is 81 yr old. Sex: Gender: male. BMI: 21 kg/m^2. Blood pressure:   145/71 Patient status: Outpatient. Study  date: Study date: 07/20/2014. Study time: 07:18 AM. Location: Echo laboratory.  -------------------------------------------------------------------  ------------------------------------------------------------------- Left ventricle: There was mild concentric hypertrophy. Systolic function was normal. The estimated ejection fraction was in the range of 50% to 55%. Wall motion was normal; there were no regional wall motion abnormalities. Doppler parameters are consistent with abnormal left ventricular relaxation (grade 1 diastolic dysfunction). Doppler parameters are consistent with elevated ventricular end-diastolic filling pressure.  ------------------------------------------------------------------- Aortic valve:  Structurally normal valve.  Cusp separation was normal. Sclerosis without stenosis. Doppler: Transvalvular velocity was within the normal range. There was no stenosis. There was mild to moderate regurgitation.  ------------------------------------------------------------------- Aorta: Aortic root: The aortic root was normal in size. Ascending aorta: The ascending aorta was normal in size.  ------------------------------------------------------------------- Mitral valve:  Structurally normal valve.  Leaflet separation was normal. Doppler: Transvalvular velocity was within the normal range. There was no evidence for stenosis. There was no significant regurgitation.  ------------------------------------------------------------------- Left atrium: The atrium was normal in size.  ------------------------------------------------------------------- Atrial septum: No defect or patent foramen ovale was identified.  ------------------------------------------------------------------- Right ventricle: The cavity size was normal. Wall thickness was normal. Systolic function was  normal.  ------------------------------------------------------------------- Pulmonic valve:  Structurally normal valve.  Cusp separation was normal. Doppler: Transvalvular velocity was within the normal range. There was no regurgitation.  ------------------------------------------------------------------- Tricuspid valve:  Doppler: There was mild regurgitation.  ------------------------------------------------------------------- Pulmonary artery:  Systolic pressure was within the normal range.  ------------------------------------------------------------------- Right atrium: The atrium was normal in size.  ------------------------------------------------------------------- Systemic veins: Inferior vena cava: The vessel  was normal in size. The respirophasic diameter changes were in the normal range (= 50%), consistent with normal central venous pressure. Diameter: 17 mm.  ------------------------------------------------------------------- Measurements  IVC                     Value    Reference ID                     17  mm   ---------  Left ventricle               Value    Reference LV ID, ED, PLAX chordal       (L)   40.4 mm   43 - 52 LV ID, ES, PLAX chordal           30.9 mm   23 - 38 LV fx shortening, PLAX chordal   (L)   24  %   >=29 LV PW thickness, ED             11  mm   --------- IVS/LV PW ratio, ED             1.05     <=1.3 Stroke volume, 2D              69  ml   --------- Stroke volume/bsa, 2D            40  ml/m^2 --------- LV e&', lateral               4.5  cm/s  --------- LV e&', medial                6.91 cm/s  --------- LV e&', average               5.71 cm/s  ---------  Ventricular septum             Value     Reference IVS thickness, ED              11.6 mm   ---------  LVOT                    Value    Reference LVOT ID, S                 20  mm   --------- LVOT area                  3.14 cm^2  --------- LVOT peak velocity, S            95.5 cm/s  --------- LVOT mean velocity, S            65  cm/s  --------- LVOT VTI, S                 22.1 cm   --------- LVOT peak gradient, S            4   mm Hg ---------  Aortic valve                Value    Reference Aortic regurg pressure half-time      481  ms   ---------  Aorta                    Value    Reference Aortic root ID, ED             26  mm   ---------  Left atrium  Value    Reference LA ID, A-P, ES               42  mm   --------- LA ID/bsa, A-P           (H)   2.43 cm/m^2 <=2.2 LA volume, S                45  ml   --------- LA volume/bsa, S              26  ml/m^2 --------- LA volume, ES, 1-p A4C           45  ml   --------- LA volume/bsa, ES, 1-p A4C         26  ml/m^2 --------- LA volume, ES, 1-p A2C           43  ml   --------- LA volume/bsa, ES, 1-p A2C         24.9 ml/m^2 ---------  Mitral valve                Value    Reference Mitral deceleration time      (H)   310  ms   150 - 230 Mitral E/A ratio, peak           0.7     ---------  Pulmonary arteries             Value    Reference PA pressure, S, DP             18  mm Hg <=30  Tricuspid valve               Value    Reference Tricuspid regurg peak velocity       195  cm/s   --------- Tricuspid peak RV-RA gradient        15  mm Hg --------- Tricuspid maximal regurg velocity,     195  cm/s  --------- PISA  Systemic veins               Value    Reference Estimated CVP                3   mm Hg ---------  Right ventricle               Value    Reference RV pressure, S, DP             18  mm Hg <=30 RV s&', lateral, S              9.65 cm/s  ---------  Legend: (L) and (H) mark values outside specified reference range.  ------------------------------------------------------------------- Prepared and Electronically Authenticated by  Ena Dawley, M.D. 2016-03-08T09:11:05   Impression:  Patient is doing well from a cardiac standpoint one year status post coronary artery bypass grafting 2. He reports stable symptoms of exertional shortness of breath and atypical chest tightness that are likely primarily related to his underlying COPD. The patient also probably has some degree of chronic diastolic congestive heart failure that is currently well controlled on medical therapy.   His underlying lung cancer is being followed by Dr. Hinton Rao.  Plan:  In the future the patient will call and return to see [place] as needed.  All of his questions have been addressed.   I spent in excess of 10 minutes during the conduct of this office consultation and >50% of this time involved direct face-to-face encounter with the patient for  counseling and/or coordination of their care.   Valentina Gu. Roxy Manns, MD 11/22/2014 1:45 PM

## 2014-11-25 DIAGNOSIS — J929 Pleural plaque without asbestos: Secondary | ICD-10-CM | POA: Diagnosis not present

## 2014-11-25 DIAGNOSIS — Z85118 Personal history of other malignant neoplasm of bronchus and lung: Secondary | ICD-10-CM | POA: Diagnosis not present

## 2014-11-25 DIAGNOSIS — Z923 Personal history of irradiation: Secondary | ICD-10-CM | POA: Diagnosis not present

## 2014-11-25 DIAGNOSIS — C3412 Malignant neoplasm of upper lobe, left bronchus or lung: Secondary | ICD-10-CM | POA: Diagnosis not present

## 2014-11-29 DIAGNOSIS — D509 Iron deficiency anemia, unspecified: Secondary | ICD-10-CM | POA: Diagnosis not present

## 2014-11-29 DIAGNOSIS — J90 Pleural effusion, not elsewhere classified: Secondary | ICD-10-CM | POA: Diagnosis not present

## 2014-11-29 DIAGNOSIS — J449 Chronic obstructive pulmonary disease, unspecified: Secondary | ICD-10-CM | POA: Diagnosis not present

## 2014-11-29 DIAGNOSIS — C3412 Malignant neoplasm of upper lobe, left bronchus or lung: Secondary | ICD-10-CM | POA: Diagnosis not present

## 2014-11-29 DIAGNOSIS — I2510 Atherosclerotic heart disease of native coronary artery without angina pectoris: Secondary | ICD-10-CM | POA: Diagnosis not present

## 2014-11-29 DIAGNOSIS — Z85118 Personal history of other malignant neoplasm of bronchus and lung: Secondary | ICD-10-CM | POA: Diagnosis not present

## 2014-11-29 DIAGNOSIS — I509 Heart failure, unspecified: Secondary | ICD-10-CM | POA: Diagnosis not present

## 2014-12-01 DIAGNOSIS — C3412 Malignant neoplasm of upper lobe, left bronchus or lung: Secondary | ICD-10-CM | POA: Diagnosis not present

## 2014-12-01 DIAGNOSIS — D649 Anemia, unspecified: Secondary | ICD-10-CM | POA: Diagnosis not present

## 2014-12-16 DIAGNOSIS — D509 Iron deficiency anemia, unspecified: Secondary | ICD-10-CM | POA: Diagnosis not present

## 2014-12-23 DIAGNOSIS — D509 Iron deficiency anemia, unspecified: Secondary | ICD-10-CM | POA: Diagnosis not present

## 2014-12-24 DIAGNOSIS — H16002 Unspecified corneal ulcer, left eye: Secondary | ICD-10-CM | POA: Diagnosis not present

## 2014-12-24 DIAGNOSIS — H578 Other specified disorders of eye and adnexa: Secondary | ICD-10-CM | POA: Diagnosis not present

## 2014-12-27 DIAGNOSIS — H16002 Unspecified corneal ulcer, left eye: Secondary | ICD-10-CM | POA: Diagnosis not present

## 2014-12-30 DIAGNOSIS — H16002 Unspecified corneal ulcer, left eye: Secondary | ICD-10-CM | POA: Diagnosis not present

## 2015-01-03 DIAGNOSIS — H16002 Unspecified corneal ulcer, left eye: Secondary | ICD-10-CM | POA: Diagnosis not present

## 2015-01-07 DIAGNOSIS — H16002 Unspecified corneal ulcer, left eye: Secondary | ICD-10-CM | POA: Diagnosis not present

## 2015-01-14 DIAGNOSIS — H16002 Unspecified corneal ulcer, left eye: Secondary | ICD-10-CM | POA: Diagnosis not present

## 2015-01-14 DIAGNOSIS — H3531 Nonexudative age-related macular degeneration: Secondary | ICD-10-CM | POA: Diagnosis not present

## 2015-01-19 ENCOUNTER — Encounter: Payer: Self-pay | Admitting: Cardiovascular Disease

## 2015-01-19 ENCOUNTER — Ambulatory Visit (INDEPENDENT_AMBULATORY_CARE_PROVIDER_SITE_OTHER): Payer: Medicare Other | Admitting: Cardiovascular Disease

## 2015-01-19 VITALS — BP 144/66 | HR 72 | Ht 68.0 in | Wt 139.7 lb

## 2015-01-19 DIAGNOSIS — I6529 Occlusion and stenosis of unspecified carotid artery: Secondary | ICD-10-CM | POA: Diagnosis not present

## 2015-01-19 DIAGNOSIS — I10 Essential (primary) hypertension: Secondary | ICD-10-CM | POA: Diagnosis not present

## 2015-01-19 DIAGNOSIS — R0602 Shortness of breath: Secondary | ICD-10-CM

## 2015-01-19 DIAGNOSIS — I2510 Atherosclerotic heart disease of native coronary artery without angina pectoris: Secondary | ICD-10-CM

## 2015-01-19 DIAGNOSIS — R600 Localized edema: Secondary | ICD-10-CM | POA: Diagnosis not present

## 2015-01-19 DIAGNOSIS — R0600 Dyspnea, unspecified: Secondary | ICD-10-CM | POA: Diagnosis not present

## 2015-01-19 DIAGNOSIS — Z951 Presence of aortocoronary bypass graft: Secondary | ICD-10-CM | POA: Diagnosis not present

## 2015-01-19 DIAGNOSIS — Z79899 Other long term (current) drug therapy: Secondary | ICD-10-CM

## 2015-01-19 LAB — CBC
HEMATOCRIT: 41.5 % (ref 39.0–52.0)
Hemoglobin: 13.1 g/dL (ref 13.0–17.0)
MCH: 26.6 pg (ref 26.0–34.0)
MCHC: 31.6 g/dL (ref 30.0–36.0)
MCV: 84.3 fL (ref 78.0–100.0)
MPV: 9.7 fL (ref 8.6–12.4)
PLATELETS: 209 10*3/uL (ref 150–400)
RBC: 4.92 MIL/uL (ref 4.22–5.81)
RDW: 20.2 % — AB (ref 11.5–15.5)
WBC: 7.4 10*3/uL (ref 4.0–10.5)

## 2015-01-19 LAB — BASIC METABOLIC PANEL
BUN: 23 mg/dL (ref 7–25)
CHLORIDE: 104 mmol/L (ref 98–110)
CO2: 28 mmol/L (ref 20–31)
Calcium: 10 mg/dL (ref 8.6–10.3)
Creat: 1.25 mg/dL — ABNORMAL HIGH (ref 0.70–1.11)
Glucose, Bld: 84 mg/dL (ref 65–99)
POTASSIUM: 4.1 mmol/L (ref 3.5–5.3)
SODIUM: 142 mmol/L (ref 135–146)

## 2015-01-19 MED ORDER — FUROSEMIDE 40 MG PO TABS: 40.0000 mg | ORAL_TABLET | Freq: Every day | ORAL | Status: DC

## 2015-01-19 NOTE — Assessment & Plan Note (Signed)
History of hyperlipidemia on red yeast rice followed by his PCP 

## 2015-01-19 NOTE — Assessment & Plan Note (Addendum)
History of CAD status post cardiac catheterization performed by myself 11/05/13. The right radial approach demonstrated 50% left main stenosis, high-grade proximal LAD, ostial circumflex and moderate RCA disease with normal LV function. Based on his eye suggested the patient would benefit most from coronary artery bypass grafting was performed by Dr. Roxy Manns 11/20/13 with off-pump LIMA to the LAD and vein to the circumflex coronary artery. Over the last several weeks he's noticed increasing dyspnea on exertion and pain in his arm similar to pre-bypass symptoms as well as increasing lower extremity edema. I'm going to increase his diuretic , routine lab work, check a 2-D echo and pharmacologic Myoview stress test to rule out an ischemic etiology.

## 2015-01-19 NOTE — Progress Notes (Signed)
01/19/2015 Glenn Reilly   Mar 02, 1929  [id number]  Primary Physician Townsend Roger, MD Primary Cardiologist: Lorretta Harp MD Renae Gloss   HPI:  Glenn Reilly is an 81 year old thin appearing widowed Caucasian male father of one son who accompanies him today. I last saw him in the office 07/16/14. He was referred by Dr. Vassie Loll Eyk His cardiac risk factor profile is remarkable for remote tobacco abuse, hyperlipidemia and hypertension. He has never had a heart attack but did have a stroke in 2011 at the time of left carotid endarterectomy. He said coronary artery disease with stenting in 1999 performed by Dr. Eustace Quail and again 2004. His left calf because k patent stent with moderate LAD and circumflex disease. He does have diastolic dysfunction. He has had lung cancer and right lung resection by Dr. Baldemar Friday in 2001. On Thursday night after mowing his lawn on a riding lower developed back pain rating to his chest associated with dyspnea on exertion. This resolved throughout the night has not recurred. He did have apparently inferior T-wave inversion on EKG today which was new. A Myoview stress test was normal as was a 2-D echo echocardiogram. Lower extremity arterial Doppler studies showed ankle-brachial indices of greater than one bilaterally with a suggestion of mild to moderate iliac disease. He developed exertional left upper extremity discomfort with dyspnea over the last month which is fairly reproducible and similar to his prior symptoms before coronary intervention. Based on this, I performed outpatient cardiac catheterization on 11/05/13 in the right radial approach. I demonstrated 50% left main, high-grade proximal LAD, ostial circumflex and moderate mid RCA disease with normal LV function. Based on this, I suggested coronary artery bypass grafting is the best revascularization strategy especially in light of his pulmonary nodule which could potentially be biopsied at  the time of surgery. This was performed by Dr. Roxy Manns on 11/20/13 with a LIMA to his LAD and a vein to the circumflex coronary artery. He also had a Tru-Cut needle biopsy of his left upper lobe nodule which turned out to be some squamous cell carcinoma. He has received radiation therapy for this. He did participate in cardiac rehabilitation. A chest CT showed diminished size of his nodule. He was hospitalized for pneumonia and was told he had "congestive heart failure. A BNP was mildly elevated. He did have lower extremity edema and his Lasix was increased. A subsequent 2-D echo performed in March of this year showed preserved LV function with diastolic dysfunction. Over the last several weeks he's noticed increasing dyspnea on exertion and lower extremity edema. He also complains of left upper extremity discomfort similar to his pre-revascularization symptoms.   Current Outpatient Prescriptions  Medication Sig Dispense Refill  . albuterol (PROVENTIL HFA;VENTOLIN HFA) 108 (90 BASE) MCG/ACT inhaler Inhale 2 puffs into the lungs every 6 (six) hours as needed for wheezing or shortness of breath.    Marland Kitchen aspirin 81 MG tablet Take 81 mg by mouth daily.    . furosemide (LASIX) 20 MG tablet '40mg'$  on Monday, Wednesday, and Friday, '20mg'$  all other days. (Patient taking differently: Take 40 mg by mouth daily. ) 30 tablet 0  . ipratropium (ATROVENT) 0.03 % nasal spray   12  . ipratropium-albuterol (DUONEB) 0.5-2.5 (3) MG/3ML SOLN Take 3 mLs by nebulization 3 (three) times daily.    Marland Kitchen levothyroxine (SYNTHROID, LEVOTHROID) 88 MCG tablet Take 88 mcg by mouth daily before breakfast.    . lisinopril (PRINIVIL,ZESTRIL) 20 MG tablet  Take 20 mg by mouth daily.    Marland Kitchen LOTEMAX 0.5 % GEL Place 1 drop into the left eye 4 (four) times daily.  4  . predniSONE (DELTASONE) 10 MG tablet Take 10 mg by mouth daily with breakfast.    . Red Yeast Rice 600 MG CAPS Take 1 capsule by mouth 2 (two) times daily.    Marland Kitchen trimethoprim-polymyxin b  (POLYTRIM) ophthalmic solution Place 1 drop into the left eye 4 (four) times daily.  1   No current facility-administered medications for this visit.    Allergies  Allergen Reactions  . Demerol [Meperidine] Other (See Comments)    shock  . Statins Other (See Comments)    Social History   Social History  . Marital Status: Widowed    Spouse Name: N/A  . Number of Children: 1  . Years of Education: N/A   Occupational History  . 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 Smoking status: Former Smoker -- 1.00 packs/day for 30 years    Types: Cigarettes    Quit date: 03/03/1979  . Smokeless tobacco: Current User    Types: Chew  . Alcohol Use: No  . Drug Use: No  . Sexual Activity: Not on file   Other Topics Concern  . Not on file   Social History Narrative     Review of Systems: General: negative for chills, fever, night sweats or weight changes.  Cardiovascular: negative for chest pain, dyspnea on exertion, edema, orthopnea, palpitations, paroxysmal nocturnal dyspnea or shortness of breath Dermatological: negative for rash Respiratory: negative for cough or wheezing Urologic: negative for hematuria Abdominal: negative for nausea, vomiting, diarrhea, bright red blood per rectum, melena, or hematemesis Neurologic: negative for visual changes, syncope, or dizziness All other systems reviewed and are otherwise negative except as noted above.    Blood pressure 144/66, pulse 72, height '5\' 8"'$  (1.727 m), weight 139 lb 11.2 oz (63.368 kg).  General appearance: alert Neck: no adenopathy, no carotid bruit, no JVD, supple, symmetrical, trachea midline and thyroid not enlarged, symmetric, no tenderness/mass/nodules Lungs: clear to auscultation bilaterally Heart: regular rate and rhythm, S1, S2 normal, no murmur, click, rub or gallop Extremities: 1-2+ bilateral lower extremity edema Skin: Skin color, texture, turgor normal. No rashes  or lesions Neurologic: Grossly normal  EKG normal sinus rhythm at 72 with nonspecific ST and T-wave changes. There was T-wave inversion in inferior leads. I personally reviewed this EKG.  ASSESSMENT AND PLAN:   S/P Off-pump CABG x 2 History of CAD status post cardiac catheterization performed by myself 11/05/13. The right radial approach demonstrated 50% left main stenosis, high-grade proximal LAD, ostial circumflex and moderate RCA disease with normal LV function. Based on his eye suggested the patient would benefit most from coronary artery bypass grafting was performed by Dr. Roxy Manns 11/20/13 with off-pump LIMA to the LAD and vein to the circumflex coronary artery. Over the last several weeks he's noticed increasing dyspnea on exertion and pain in his arm similar to pre-bypass symptoms as well as increasing lower extremity edema. I'm going to increase his diuretic , routine lab work, check a 2-D echo and pharmacologic Myoview stress test to rule out an ischemic etiology.  Peripheral vascular disease History of PAD status post bilateral iliac stenting remotely. His most recent Dopplers performed 03/24/13 revealed normal ABIs with moderately elevated frequencies of both iliac arteries. The patient really denies claudication.  HYPERTENSION, BENIGN History of hypertension with  blood pressure measured at 144/66. He is on lisinopril. Continue current meds at current dosing  HYPERLIPIDEMIA-MIXED History of hyperlipidemia on red yeast rice followed by his PCP  Dyspnea History of COPD now with increasing dyspnea on exertion, lower extremity edema and upper extremity discomfort similar to his pre-revascularization symptoms. He is slightly over a years post coronary artery bypass grafting. He does have 1+ pitting edema. I'm going to increase his diuretic and obtain a 2-D echo and from a plastic Myoview stress test.  Carotid artery disease History of carotid disease status post left carotid endarterectomy  in the past. He has not had carotid Doppler studies recently which we will repeat      Lorretta Harp MD [facility], [facility] 01/19/2015 9:25 AM

## 2015-01-19 NOTE — Assessment & Plan Note (Signed)
History of hypertension with blood pressure measured at 144/66. He is on lisinopril. Continue current meds at current dosing

## 2015-01-19 NOTE — Patient Instructions (Signed)
Medication Instructions:   Take '40mg'$  of Lasix everyday  Labwork:  Blood work today and then again in 2 weeks  Testing/Procedures:   Echocardiogram. Echocardiography is a painless test that uses sound waves to create images of your heart. It provides your doctor with information about the size and shape of your heart and how well your heart's chambers and valves are working. This procedure takes approximately one hour. There are no restrictions for this procedure. This test is performed at [address] floor.    Lexiscan Myoview- this is a test that looks at the blood flow to your heart muscle.  It takes approximately 2 1/2 hours. Please follow instruction sheet, as given.   Carotid Duplex- This test is an ultrasound of the carotid arteries in your neck. It looks at blood flow through these arteries that supply the brain with blood. Allow one hour for this exam. There are no restrictions or special instructions.  Lower venous duplex. This test is an ultrasound of the veins in the legs. It looks at venous blood flow that carries blood from the heart to the legs or arms. Allow one hour for a Lower Venous exam. There are no restrictions or special instructions.   Follow-Up:  3 weeks with Dr Gwenlyn Found  Any Other Special Instructions Will Be Listed Below (If Applicable).

## 2015-01-19 NOTE — Assessment & Plan Note (Signed)
History of carotid disease status post left carotid endarterectomy in the past. He has not had carotid Doppler studies recently which we will repeat

## 2015-01-19 NOTE — Assessment & Plan Note (Signed)
History of PAD status post bilateral iliac stenting remotely. His most recent Dopplers performed 03/24/13 revealed normal ABIs with moderately elevated frequencies of both iliac arteries. The patient really denies claudication.

## 2015-01-19 NOTE — Assessment & Plan Note (Signed)
History of COPD now with increasing dyspnea on exertion, lower extremity edema and upper extremity discomfort similar to his pre-revascularization symptoms. He is slightly over a years post coronary artery bypass grafting. He does have 1+ pitting edema. I'm going to increase his diuretic and obtain a 2-D echo and from a plastic Myoview stress test.

## 2015-01-21 ENCOUNTER — Telehealth (HOSPITAL_COMMUNITY): Payer: Self-pay

## 2015-01-21 NOTE — Telephone Encounter (Signed)
Encounter complete. 

## 2015-01-25 ENCOUNTER — Ambulatory Visit (HOSPITAL_COMMUNITY)
Admission: RE | Admit: 2015-01-25 | Discharge: 2015-01-25 | Disposition: A | Discharge status: Home / Self Care | Payer: Medicare Other | Source: Ambulatory Visit | Attending: Cardiology | Admitting: Cardiology

## 2015-01-25 DIAGNOSIS — M79602 Pain in left arm: Secondary | ICD-10-CM | POA: Diagnosis not present

## 2015-01-25 DIAGNOSIS — R0609 Other forms of dyspnea: Secondary | ICD-10-CM | POA: Diagnosis not present

## 2015-01-25 DIAGNOSIS — R5383 Other fatigue: Secondary | ICD-10-CM | POA: Insufficient documentation

## 2015-01-25 DIAGNOSIS — I10 Essential (primary) hypertension: Secondary | ICD-10-CM | POA: Insufficient documentation

## 2015-01-25 DIAGNOSIS — R0602 Shortness of breath: Secondary | ICD-10-CM | POA: Diagnosis not present

## 2015-01-25 LAB — MYOCARDIAL PERFUSION IMAGING
CHL CUP RESTING HR STRESS: 72 {beats}/min
CHL CUP STRESS STAGE 1 GRADE: 0 %
CHL CUP STRESS STAGE 1 HR: 70 {beats}/min
CHL CUP STRESS STAGE 1 SBP: 176 mmHg
CHL CUP STRESS STAGE 2 GRADE: 0 %
CHL CUP STRESS STAGE 2 HR: 70 {beats}/min
CHL CUP STRESS STAGE 2 SPEED: 0 mph
CHL CUP STRESS STAGE 4 DBP: 82 mmHg
CHL CUP STRESS STAGE 4 GRADE: 0 %
CHL CUP STRESS STAGE 4 SPEED: 0 mph
CSEPPHR: 84 {beats}/min
Estimated workload: 1 METS
LV sys vol: 36 mL
LVDIAVOL: 88 mL
NUC STRESS TID: 0.94
Percent of predicted max HR: 61 %
SDS: 2
SRS: 0
SSS: 2
Stage 1 DBP: 96 mmHg
Stage 1 Speed: 0 mph
Stage 3 Grade: 0 %
Stage 3 HR: 84 {beats}/min
Stage 3 Speed: 0 mph
Stage 4 HR: 75 {beats}/min
Stage 4 SBP: 166 mmHg

## 2015-01-25 MED ORDER — AMINOPHYLLINE 25 MG/ML IV SOLN
75.0000 mg | Freq: Once | INTRAVENOUS | Status: AC
  Administered 2015-01-25: 75 mg via INTRAVENOUS

## 2015-01-25 MED ORDER — TECHNETIUM TC 99M SESTAMIBI GENERIC - CARDIOLITE
11.0000 | Freq: Once | INTRAVENOUS | Status: AC | PRN
  Administered 2015-01-25: 11 via INTRAVENOUS

## 2015-01-25 MED ORDER — TECHNETIUM TC 99M SESTAMIBI GENERIC - CARDIOLITE
32.0000 | Freq: Once | INTRAVENOUS | Status: AC | PRN
  Administered 2015-01-25: 32 via INTRAVENOUS

## 2015-01-25 MED ORDER — REGADENOSON 0.4 MG/5ML IV SOLN
0.4000 mg | Freq: Once | INTRAVENOUS | Status: AC
  Administered 2015-01-25: 0.4 mg via INTRAVENOUS

## 2015-01-26 ENCOUNTER — Ambulatory Visit (HOSPITAL_COMMUNITY): Payer: Medicare Other | Attending: Cardiology

## 2015-01-26 ENCOUNTER — Other Ambulatory Visit: Payer: Self-pay

## 2015-01-26 DIAGNOSIS — R0602 Shortness of breath: Secondary | ICD-10-CM | POA: Diagnosis not present

## 2015-01-26 DIAGNOSIS — I10 Essential (primary) hypertension: Secondary | ICD-10-CM | POA: Insufficient documentation

## 2015-01-26 DIAGNOSIS — E785 Hyperlipidemia, unspecified: Secondary | ICD-10-CM | POA: Diagnosis not present

## 2015-01-26 DIAGNOSIS — F17200 Nicotine dependence, unspecified, uncomplicated: Secondary | ICD-10-CM | POA: Insufficient documentation

## 2015-01-26 DIAGNOSIS — I5189 Other ill-defined heart diseases: Secondary | ICD-10-CM | POA: Insufficient documentation

## 2015-01-26 DIAGNOSIS — I739 Peripheral vascular disease, unspecified: Secondary | ICD-10-CM | POA: Insufficient documentation

## 2015-01-26 DIAGNOSIS — I351 Nonrheumatic aortic (valve) insufficiency: Secondary | ICD-10-CM | POA: Insufficient documentation

## 2015-01-26 DIAGNOSIS — R0600 Dyspnea, unspecified: Secondary | ICD-10-CM | POA: Diagnosis not present

## 2015-01-27 DIAGNOSIS — J209 Acute bronchitis, unspecified: Secondary | ICD-10-CM | POA: Diagnosis not present

## 2015-01-27 DIAGNOSIS — J441 Chronic obstructive pulmonary disease with (acute) exacerbation: Secondary | ICD-10-CM | POA: Diagnosis not present

## 2015-01-28 DIAGNOSIS — H16002 Unspecified corneal ulcer, left eye: Secondary | ICD-10-CM | POA: Diagnosis not present

## 2015-02-01 ENCOUNTER — Ambulatory Visit (HOSPITAL_COMMUNITY)
Admission: RE | Admit: 2015-02-01 | Discharge: 2015-02-01 | Disposition: A | Discharge status: Home / Self Care | Payer: Medicare Other | Source: Ambulatory Visit | Attending: Cardiovascular Disease | Admitting: Cardiovascular Disease

## 2015-02-01 ENCOUNTER — Ambulatory Visit (HOSPITAL_BASED_OUTPATIENT_CLINIC_OR_DEPARTMENT_OTHER)
Admission: RE | Admit: 2015-02-01 | Discharge: 2015-02-01 | Disposition: A | Discharge status: Home / Self Care | Payer: Medicare Other | Source: Ambulatory Visit | Attending: Cardiovascular Disease | Admitting: Cardiovascular Disease

## 2015-02-01 DIAGNOSIS — Z79899 Other long term (current) drug therapy: Secondary | ICD-10-CM | POA: Diagnosis not present

## 2015-02-01 DIAGNOSIS — Z87891 Personal history of nicotine dependence: Secondary | ICD-10-CM | POA: Diagnosis not present

## 2015-02-01 DIAGNOSIS — Z951 Presence of aortocoronary bypass graft: Secondary | ICD-10-CM | POA: Insufficient documentation

## 2015-02-01 DIAGNOSIS — J449 Chronic obstructive pulmonary disease, unspecified: Secondary | ICD-10-CM | POA: Insufficient documentation

## 2015-02-01 DIAGNOSIS — I6529 Occlusion and stenosis of unspecified carotid artery: Secondary | ICD-10-CM | POA: Diagnosis not present

## 2015-02-01 DIAGNOSIS — I739 Peripheral vascular disease, unspecified: Secondary | ICD-10-CM | POA: Diagnosis not present

## 2015-02-01 DIAGNOSIS — E785 Hyperlipidemia, unspecified: Secondary | ICD-10-CM | POA: Diagnosis not present

## 2015-02-01 DIAGNOSIS — I2510 Atherosclerotic heart disease of native coronary artery without angina pectoris: Secondary | ICD-10-CM | POA: Diagnosis not present

## 2015-02-01 DIAGNOSIS — I10 Essential (primary) hypertension: Secondary | ICD-10-CM | POA: Diagnosis not present

## 2015-02-01 DIAGNOSIS — I6523 Occlusion and stenosis of bilateral carotid arteries: Secondary | ICD-10-CM | POA: Insufficient documentation

## 2015-02-01 DIAGNOSIS — R600 Localized edema: Secondary | ICD-10-CM | POA: Insufficient documentation

## 2015-02-02 LAB — CBC
HCT: 38.1 % — ABNORMAL LOW (ref 39.0–52.0)
HEMOGLOBIN: 11.8 g/dL — AB (ref 13.0–17.0)
MCH: 26.1 pg (ref 26.0–34.0)
MCHC: 31 g/dL (ref 30.0–36.0)
MCV: 84.3 fL (ref 78.0–100.0)
MPV: 8.8 fL (ref 8.6–12.4)
Platelets: 254 10*3/uL (ref 150–400)
RBC: 4.52 MIL/uL (ref 4.22–5.81)
RDW: 20.2 % — ABNORMAL HIGH (ref 11.5–15.5)
WBC: 6.8 10*3/uL (ref 4.0–10.5)

## 2015-02-02 LAB — BASIC METABOLIC PANEL
BUN: 27 mg/dL — ABNORMAL HIGH (ref 7–25)
CALCIUM: 9.7 mg/dL (ref 8.6–10.3)
CO2: 33 mmol/L — AB (ref 20–31)
CREATININE: 1.2 mg/dL — AB (ref 0.70–1.11)
Chloride: 103 mmol/L (ref 98–110)
GLUCOSE: 109 mg/dL — AB (ref 65–99)
Potassium: 4.2 mmol/L (ref 3.5–5.3)
Sodium: 146 mmol/L (ref 135–146)

## 2015-02-04 ENCOUNTER — Ambulatory Visit (INDEPENDENT_AMBULATORY_CARE_PROVIDER_SITE_OTHER): Payer: Medicare Other | Admitting: Cardiovascular Disease

## 2015-02-04 ENCOUNTER — Encounter: Payer: Self-pay | Admitting: Cardiovascular Disease

## 2015-02-04 ENCOUNTER — Other Ambulatory Visit: Payer: Self-pay

## 2015-02-04 VITALS — BP 130/92 | HR 64 | Ht 68.0 in | Wt 142.2 lb

## 2015-02-04 DIAGNOSIS — R0600 Dyspnea, unspecified: Secondary | ICD-10-CM

## 2015-02-04 DIAGNOSIS — I739 Peripheral vascular disease, unspecified: Secondary | ICD-10-CM

## 2015-02-04 DIAGNOSIS — I6529 Occlusion and stenosis of unspecified carotid artery: Secondary | ICD-10-CM

## 2015-02-04 DIAGNOSIS — I779 Disorder of arteries and arterioles, unspecified: Secondary | ICD-10-CM

## 2015-02-04 NOTE — Progress Notes (Signed)
Glenn Reilly returns for follow-up of his outpatient diagnostic tests performed because of symptoms of dyspnea, edema and upper extremity discomfort. A Myoview stress test was normal. A 2-D echo was normal. I had increased his Lasix to 40 mg daily. Weight is stable as are his laboratory results. He did see his primary care physician who treated him with steroid-induced resulting improvement in his symptoms. I reassured Glenn Reilly of the likelihood that his symptoms are cardiovascular in nature were small. He was receptive to this. Carotid Doppler showed moderate right and mild to moderate left ICA stenosis. Venous Doppler showed no evidence of DVT.We will see him back in 6 months

## 2015-02-04 NOTE — Assessment & Plan Note (Signed)
[name] returns for follow-up of his outpatient diagnostic tests performed because of symptoms of dyspnea, edema and upper extremity discomfort. A Myoview stress test was normal. A 2-D echo was normal. I had increased his Lasix to 40 mg daily. Weight is stable as are his laboratory results. He did see his primary care physician who treated him with steroid-induced resulting improvement in his symptoms. I reassured [name] of the likelihood that his symptoms are cardiovascular in nature were small. He was receptive to this. We will see him back in 6 months

## 2015-02-04 NOTE — Assessment & Plan Note (Signed)
History of carotid artery disease with recent Dopplers suggesting moderate right and mild to moderate left ICA stenosis which have remained stable. He is neurologically astigmatic. These will be repeated in one year

## 2015-02-04 NOTE — Patient Instructions (Signed)
Medication Instructions:  Your physician recommends that you continue on your current medications as directed. Please refer to the Current Medication list given to you today.   Labwork: NONE  Testing/Procedures: NONE  Follow-Up: We request that you follow-up in: 6 months with an extender and in 12 months with Dr Gwenlyn Found.  You will receive a reminder letter in the mail two months in advance. If you don't receive a letter, please call our office to schedule the follow-up appointment.    Any Other Special Instructions Will Be Listed Below (If Applicable).

## 2015-02-18 DIAGNOSIS — R05 Cough: Secondary | ICD-10-CM | POA: Diagnosis not present

## 2015-02-18 DIAGNOSIS — J449 Chronic obstructive pulmonary disease, unspecified: Secondary | ICD-10-CM | POA: Diagnosis not present

## 2015-02-18 DIAGNOSIS — J40 Bronchitis, not specified as acute or chronic: Secondary | ICD-10-CM | POA: Diagnosis not present

## 2015-02-18 DIAGNOSIS — H16002 Unspecified corneal ulcer, left eye: Secondary | ICD-10-CM | POA: Diagnosis not present

## 2015-02-21 DIAGNOSIS — I739 Peripheral vascular disease, unspecified: Secondary | ICD-10-CM | POA: Diagnosis not present

## 2015-02-21 DIAGNOSIS — E039 Hypothyroidism, unspecified: Secondary | ICD-10-CM | POA: Diagnosis not present

## 2015-02-21 DIAGNOSIS — I679 Cerebrovascular disease, unspecified: Secondary | ICD-10-CM | POA: Diagnosis not present

## 2015-02-21 DIAGNOSIS — I519 Heart disease, unspecified: Secondary | ICD-10-CM | POA: Diagnosis not present

## 2015-02-21 DIAGNOSIS — E559 Vitamin D deficiency, unspecified: Secondary | ICD-10-CM | POA: Diagnosis not present

## 2015-02-21 DIAGNOSIS — Z1389 Encounter for screening for other disorder: Secondary | ICD-10-CM | POA: Diagnosis not present

## 2015-02-21 DIAGNOSIS — I10 Essential (primary) hypertension: Secondary | ICD-10-CM | POA: Diagnosis not present

## 2015-02-21 DIAGNOSIS — E782 Mixed hyperlipidemia: Secondary | ICD-10-CM | POA: Diagnosis not present

## 2015-02-21 DIAGNOSIS — M4850XS Collapsed vertebra, not elsewhere classified, site unspecified, sequela of fracture: Secondary | ICD-10-CM | POA: Diagnosis not present

## 2015-02-21 DIAGNOSIS — Z23 Encounter for immunization: Secondary | ICD-10-CM | POA: Diagnosis not present

## 2015-02-21 DIAGNOSIS — I2510 Atherosclerotic heart disease of native coronary artery without angina pectoris: Secondary | ICD-10-CM | POA: Diagnosis not present

## 2015-02-21 DIAGNOSIS — N400 Enlarged prostate without lower urinary tract symptoms: Secondary | ICD-10-CM | POA: Diagnosis not present

## 2015-02-28 DIAGNOSIS — M810 Age-related osteoporosis without current pathological fracture: Secondary | ICD-10-CM | POA: Diagnosis not present

## 2015-03-01 DIAGNOSIS — C3412 Malignant neoplasm of upper lobe, left bronchus or lung: Secondary | ICD-10-CM | POA: Diagnosis not present

## 2015-03-01 DIAGNOSIS — N189 Chronic kidney disease, unspecified: Secondary | ICD-10-CM | POA: Diagnosis not present

## 2015-03-01 DIAGNOSIS — D631 Anemia in chronic kidney disease: Secondary | ICD-10-CM | POA: Diagnosis not present

## 2015-03-11 DIAGNOSIS — H16002 Unspecified corneal ulcer, left eye: Secondary | ICD-10-CM | POA: Diagnosis not present

## 2015-03-22 DIAGNOSIS — A46 Erysipelas: Secondary | ICD-10-CM | POA: Diagnosis not present

## 2015-04-12 DIAGNOSIS — C4442 Squamous cell carcinoma of skin of scalp and neck: Secondary | ICD-10-CM | POA: Diagnosis not present

## 2015-04-12 DIAGNOSIS — R233 Spontaneous ecchymoses: Secondary | ICD-10-CM | POA: Diagnosis not present

## 2015-04-12 DIAGNOSIS — C44629 Squamous cell carcinoma of skin of left upper limb, including shoulder: Secondary | ICD-10-CM | POA: Diagnosis not present

## 2015-04-12 DIAGNOSIS — D0462 Carcinoma in situ of skin of left upper limb, including shoulder: Secondary | ICD-10-CM | POA: Diagnosis not present

## 2015-04-12 DIAGNOSIS — C44622 Squamous cell carcinoma of skin of right upper limb, including shoulder: Secondary | ICD-10-CM | POA: Diagnosis not present

## 2015-04-12 DIAGNOSIS — L570 Actinic keratosis: Secondary | ICD-10-CM | POA: Diagnosis not present

## 2015-05-13 DIAGNOSIS — H16002 Unspecified corneal ulcer, left eye: Secondary | ICD-10-CM | POA: Diagnosis not present

## 2015-05-23 DIAGNOSIS — N50811 Right testicular pain: Secondary | ICD-10-CM | POA: Diagnosis not present

## 2015-05-23 DIAGNOSIS — N401 Enlarged prostate with lower urinary tract symptoms: Secondary | ICD-10-CM | POA: Diagnosis not present

## 2015-05-26 DIAGNOSIS — E039 Hypothyroidism, unspecified: Secondary | ICD-10-CM | POA: Diagnosis not present

## 2015-05-26 DIAGNOSIS — I10 Essential (primary) hypertension: Secondary | ICD-10-CM | POA: Diagnosis not present

## 2015-05-26 DIAGNOSIS — M810 Age-related osteoporosis without current pathological fracture: Secondary | ICD-10-CM | POA: Diagnosis not present

## 2015-05-30 DIAGNOSIS — Z951 Presence of aortocoronary bypass graft: Secondary | ICD-10-CM | POA: Diagnosis not present

## 2015-05-30 DIAGNOSIS — Z902 Acquired absence of lung [part of]: Secondary | ICD-10-CM | POA: Diagnosis not present

## 2015-05-30 DIAGNOSIS — R918 Other nonspecific abnormal finding of lung field: Secondary | ICD-10-CM | POA: Diagnosis not present

## 2015-05-30 DIAGNOSIS — C3490 Malignant neoplasm of unspecified part of unspecified bronchus or lung: Secondary | ICD-10-CM | POA: Diagnosis not present

## 2015-05-30 DIAGNOSIS — C3412 Malignant neoplasm of upper lobe, left bronchus or lung: Secondary | ICD-10-CM | POA: Diagnosis not present

## 2015-05-30 DIAGNOSIS — I700 Atherosclerosis of aorta: Secondary | ICD-10-CM | POA: Diagnosis not present

## 2015-05-30 DIAGNOSIS — J90 Pleural effusion, not elsewhere classified: Secondary | ICD-10-CM | POA: Diagnosis not present

## 2015-06-01 DIAGNOSIS — I509 Heart failure, unspecified: Secondary | ICD-10-CM | POA: Diagnosis not present

## 2015-06-01 DIAGNOSIS — J90 Pleural effusion, not elsewhere classified: Secondary | ICD-10-CM

## 2015-06-01 DIAGNOSIS — R970 Elevated carcinoembryonic antigen [CEA]: Secondary | ICD-10-CM | POA: Diagnosis not present

## 2015-06-01 DIAGNOSIS — I2510 Atherosclerotic heart disease of native coronary artery without angina pectoris: Secondary | ICD-10-CM | POA: Diagnosis not present

## 2015-06-01 DIAGNOSIS — Z951 Presence of aortocoronary bypass graft: Secondary | ICD-10-CM | POA: Diagnosis not present

## 2015-06-01 DIAGNOSIS — M810 Age-related osteoporosis without current pathological fracture: Secondary | ICD-10-CM | POA: Diagnosis not present

## 2015-06-01 DIAGNOSIS — J449 Chronic obstructive pulmonary disease, unspecified: Secondary | ICD-10-CM | POA: Diagnosis not present

## 2015-06-01 DIAGNOSIS — C3412 Malignant neoplasm of upper lobe, left bronchus or lung: Secondary | ICD-10-CM | POA: Diagnosis not present

## 2015-06-01 DIAGNOSIS — N189 Chronic kidney disease, unspecified: Secondary | ICD-10-CM | POA: Diagnosis not present

## 2015-06-01 DIAGNOSIS — Z85118 Personal history of other malignant neoplasm of bronchus and lung: Secondary | ICD-10-CM | POA: Diagnosis not present

## 2015-06-02 DIAGNOSIS — R05 Cough: Secondary | ICD-10-CM | POA: Diagnosis not present

## 2015-06-02 DIAGNOSIS — R5383 Other fatigue: Secondary | ICD-10-CM | POA: Diagnosis not present

## 2015-06-02 DIAGNOSIS — R0600 Dyspnea, unspecified: Secondary | ICD-10-CM | POA: Diagnosis not present

## 2015-06-02 DIAGNOSIS — R51 Headache: Secondary | ICD-10-CM | POA: Diagnosis not present

## 2015-06-06 DIAGNOSIS — C3412 Malignant neoplasm of upper lobe, left bronchus or lung: Secondary | ICD-10-CM | POA: Diagnosis not present

## 2015-06-06 DIAGNOSIS — M810 Age-related osteoporosis without current pathological fracture: Secondary | ICD-10-CM | POA: Diagnosis not present

## 2015-07-08 DIAGNOSIS — J209 Acute bronchitis, unspecified: Secondary | ICD-10-CM | POA: Diagnosis not present

## 2015-07-26 ENCOUNTER — Telehealth: Payer: Self-pay | Admitting: Cardiovascular Disease

## 2015-07-26 NOTE — Telephone Encounter (Signed)
NEW MESSAGE  Pt c/o of Chest Pain: STAT if CP now or developed within 24 hours  1. Are you having CP right now? NO  2. Are you experiencing any other symptoms (ex. SOB, nausea, vomiting, sweating)? SWEATING  3. How long have you been experiencing CP? WEEK  4. Is your CP continuous or coming and going? COMING AND GOING  5. Have you taken Nitroglycerin? NO?

## 2015-07-26 NOTE — Telephone Encounter (Signed)
Glenn Reilly was able to get patient an appointment on 08/03/2015 @ 10am with B. Rosita Fire, PA  LM for Kim with appt date, time, location info.   Message routed to MD as Juluis Rainier

## 2015-07-26 NOTE — Telephone Encounter (Signed)
Patient complains of chest tightness over the past week  Symptoms come and go, mostly notices in the AM when he wakes up Sweats at night SOB r/t emphysema, lung cancer  Patient would like office visit - he is fine to see NP/PA  Have reached out to Brunetta Genera to assist with scheduling.

## 2015-08-03 ENCOUNTER — Encounter: Payer: Self-pay | Admitting: Physician Assistant

## 2015-08-03 ENCOUNTER — Ambulatory Visit (INDEPENDENT_AMBULATORY_CARE_PROVIDER_SITE_OTHER): Payer: Medicare Other | Admitting: Physician Assistant

## 2015-08-03 VITALS — BP 150/70 | HR 76 | Ht 68.0 in | Wt 143.0 lb

## 2015-08-03 DIAGNOSIS — I25709 Atherosclerosis of coronary artery bypass graft(s), unspecified, with unspecified angina pectoris: Secondary | ICD-10-CM | POA: Diagnosis not present

## 2015-08-03 DIAGNOSIS — R0789 Other chest pain: Secondary | ICD-10-CM | POA: Diagnosis not present

## 2015-08-03 MED ORDER — CARVEDILOL 6.25 MG PO TABS: 6.2500 mg | ORAL_TABLET | Freq: Two times a day (BID) | ORAL | Status: DC

## 2015-08-03 NOTE — Patient Instructions (Signed)
Your physician recommends that you schedule a follow-up appointment in: 3-4 Weeks with Dr Gwenlyn Found  Your physician has requested that you have a lexiscan myoview. For further information please visit [email or web address]. Please follow instruction sheet, as given.  Your physician has recommended you make the following change in your medication: START Carvedilol 6.25 mg twice a day

## 2015-08-03 NOTE — Progress Notes (Signed)
Cardiology Office Note   Date:  08/03/2015   ID:  Glenn Reilly, DOB 10-28-1928, MRN [id number]  PCP:  Townsend Roger, MD  Cardiologist:  Dr. Gwenlyn Found  Chief Complaint  Patient presents with  . Follow-up    seen for Dr. Gwenlyn Found      History of Present Illness: Glenn Reilly is a 82 y.o. male who presents for six-month follow-up. He has a past medical history of CAD s/p CABG 11/20/2013 LIMA to LAD and SVG to circumflex, PAD status post bilateral iliac stenting remotely, carotid artery disease, hypertension, hyperlipidemia and COPD. He initially underwent cardiac catheterization on 11/05/2013 which showed 50% left main, 70% proximal LAD, 75-80% left circumflex lesion. CT surgery was recommended, and he subsequently underwent the off-pump two-vessel CABG with LIMA to the distal LAD, SVG to OM1 by Dr. Roxy Manns on 11/20/2013. The patient was evaluated by Dr. Gwenlyn Found in September 2016 for increasing dyspnea on exertion, lower extremity edema and upper extremity discomfort. His Lasix was increased. Echocardiogram obtained on 01/26/2015 showed EF 82-42%, grade 1 diastolic dysfunction, peak PA pressure 19 mmHg. Myoview obtained on 01/25/2015 showed normal perfusion, EF 55-65%, low risk study. Lower extremity venous ultrasound was negative for blood clot. Carotid ultrasound performed in the same month showed moderate right ICA stenosis 40-59%, recommend 12 month follow-up.  He is here for six-month follow-up. Patient came in today for evaluation of chest pressure has been occurring for the past 2 weeks. It does not have clear exertional correlation. According to the patient, his usual angina was left arm pain and chest pressure. This time he did not have any left arm pain. However he is concerned about the chest pressure. The most strenuous activity he has been doing in the last 2 weeks was cleaning the backyard and mowing the weed yesterday. He did not have any chest pressure during that time. Most of the times  he has chest pressure in the morning.     Past Medical History  Diagnosis Date  . PVD (peripheral vascular disease) ([place]) 2011    carotid endarterectomy, pv stents  . Stroke [facility]) 07/2009    left carotid endarterectomy  . HTN (hypertension)   . Hyperlipemia   . Carotid artery disease ([place])   . Detached retina   . Cataract   . COPD (chronic obstructive pulmonary disease) ([place])   . Asbestosis ([place])   . CAD, [place] PCI 2004 Cath 11/05/2013 with 50% LMain, 75% prox LAD, 80% prox LCx, 50% RCA with normal EF    . 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 Lung mass 10/19/2013    CT of the chest 10/12/2013 reveals an enlarging left upper lobe irregular nodule. Now 1.4 compared to 1.0 cm in diameter when compared to March 2015 CT scan. There is calcified pleural plaques seen bilaterally. There is subpleural scarring seen as well. Previous right lower lobectomy has been demonstrated.  PET-CT scan from June 2015 is reviewed this shows increased uptake (SUV > 8) in left upper   . HYPERLIPIDEMIA-MIXED 01/13/2010    Qualifier: Diagnosis of  By: Olevia Perches, MD, Glenetta Hew   . COPD with emphysema ([place]) 01/13/2010    Spirometry 10/19/2013 is reviewed. This reveals FEV1 44% predicted FVC 47% predicted and FEF 25 7537% predicted   .  PVD 01/13/2010    Qualifier: Diagnosis of  By: Olevia Perches, MD, Glenetta Hew   . HYPERTENSION, BENIGN 01/13/2010    Qualifier: Diagnosis of  By: Olevia Perches, MD, Glenetta Hew   . Asthma   . Hypothyroidism   . Factor 5 Leiden mutation, heterozygous ([place])   . Complication of anesthesia     lt side throat parlysis  . Dysphagia as late effect of stroke     Developed as complication of stroke following left carotid endarterectomy with documented left hypoglossal nerve paralysis   . S/P CABG x 2 11/20/2013      Off-pump CABG x2 using LIMA to LAD, SVG to OM1, EVH via right thigh  . Radiation 9/1, 9/3, 01/19/14    Left upper lung 3 fractions  . Lung cancer ([place]) 2001    lower right lobe surgery to remove cancer, no chemo or radiation    Past Surgical History  Procedure Laterality Date  . Appendectomy    . Hemorrhoid surgery    . 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 Video assisted thoracoscopy (vats)/empyema Right 07/13/1999    drainage of post-operative empyema following RLLobectomy  . Carotid endarterectomy Left 2011    [facility] - complicated by post-op stroke  . Iliac artery stent Bilateral 2011    [facility] - performed for asymptomatic iliac artery disease  . Fracture surgery Left     foot  . Prostate surgery      TURP  . Percutaneous coronary stent intervention (pci-s)  1999    RCA  . Intraoperative transesophageal echocardiogram N/A 11/20/2013    Procedure: INTRAOPERATIVE TRANSESOPHAGEAL ECHOCARDIOGRAM;  Surgeon: Rexene Alberts, MD;  Location: [place];  Service: Open Heart Surgery;  Laterality: N/A;  . Coronary artery bypass graft N/A 11/20/2013    Procedure: OFF PUMP CORONARY ARTERY BYPASS GRAFTING (CABG) x2: LIMA to LAD, SVG to Obtuse Marginal 1.;  Surgeon: Rexene Alberts, MD;  Location: [place];  Service: Open Heart Surgery;  Laterality: N/A;  . Biopsy Left 11/20/2013    Procedure: BIOPSY Left Upper Lobe;  Surgeon: Rexene Alberts, MD;  Location: [place];  Service: Open Heart Surgery;  Laterality: Left;  . Left heart catheterization with coronary angiogram N/A 11/05/2013    Procedure: LEFT HEART CATHETERIZATION WITH CORONARY ANGIOGRAM;  Surgeon: Lorretta Harp, MD;  Location: [facility] CATH LAB;  Service: Cardiovascular;  Laterality: N/A;     Current Outpatient Prescriptions  Medication Sig Dispense Refill  . albuterol (PROVENTIL HFA;VENTOLIN HFA) 108 (90 BASE) MCG/ACT inhaler Inhale 2  puffs into the lungs every 6 (six) hours as needed for wheezing or shortness of breath.    Marland Kitchen aspirin 81 MG tablet Take 81 mg by mouth daily.    . furosemide (LASIX) 40 MG tablet Take 1 tablet (40 mg total) by mouth daily. 90 tablet 3  . ipratropium (ATROVENT) 0.03 % nasal spray Place 1 spray into both nostrils 2 (two) times daily.   12  . ipratropium-albuterol (DUONEB) 0.5-2.5 (3) MG/3ML SOLN Take 3 mLs by nebulization 3 (three) times daily.    Marland Kitchen levothyroxine (SYNTHROID, LEVOTHROID) 88 MCG tablet Take 88 mcg by mouth daily before breakfast.    . lisinopril (PRINIVIL,ZESTRIL) 40 MG tablet 40 mg once.  11  . LOTEMAX 0.5 % GEL Place 1 drop into the left eye 4 (four) times daily.  4  . predniSONE (DELTASONE) 10  MG tablet Take 10 mg by mouth daily with breakfast.    . Red Yeast Rice 600 MG CAPS Take 1 capsule by mouth 2 (two) times daily.    . carvedilol (COREG) 6.25 MG tablet Take 1 tablet (6.25 mg total) by mouth 2 (two) times daily. 60 tablet 6   No current facility-administered medications for this visit.    Allergies:   Demerol and Statins    Social History:  The patient  reports that he quit smoking about 36 years ago. His smoking use included Cigarettes. He has a 30 pack-year smoking history. His smokeless tobacco use includes Chew. He reports that he does not drink alcohol or use illicit drugs.   Family History:  The patient's family history includes Cancer in his brother and brother; Heart attack in his father and mother.    ROS:  Please see the history of present illness.   Otherwise, review of systems are positive for chest pain.   All other systems are reviewed and negative.    PHYSICAL EXAM: VS:  BP 150/70 mmHg  Pulse 76  Ht '5\' 8"'$  (1.727 m)  Wt 143 lb (64.864 kg)  BMI 21.75 kg/m2 , BMI Body mass index is 21.75 kg/(m^2). GEN: Well nourished, well developed, in no acute distress HEENT: normal Neck: no JVD, carotid bruits, or masses Cardiac: RRR; no murmurs, rubs, or  gallops,no edema  Respiratory:  clear to auscultation bilaterally, normal work of breathing GI: soft, nontender, nondistended, + BS MS: no deformity or atrophy Skin: warm and dry, no rash Neuro:  Strength and sensation are intact Psych: euthymic mood, full affect   EKG:  EKG is ordered today. The ekg ordered today demonstrates NSR with TWI in inferior leads and V4-V6   Recent Labs: 02/01/2015: BUN 27*; Creat 1.20*; Hemoglobin 11.8*; Platelets 254; Potassium 4.2; Sodium 146    Lipid Panel No results found for: CHOL, TRIG, HDL, CHOLHDL, VLDL, LDLCALC, LDLDIRECT    Wt Readings from Last 3 Encounters:  08/03/15 143 lb (64.864 kg)  02/04/15 142 lb 3.2 oz (64.501 kg)  01/25/15 139 lb (63.05 kg)      Other studies Reviewed: Additional studies/ records that were reviewed today include:   Cath 11/05/2013 ANGIOGRAPHIC RESULTS:   1. Left main; 50% diffuse damping using a 5 [place] catheter  2. LAD; 75% focal proximal fluoroscopic calcification 3. Left circumflex; 75-80% with ostial fluoroscopic calcification.  4. Right coronary artery; dominant with a long 50% segmental mid stenosis 5. Left ventriculography; RAO left ventriculogram was performed using  25 mL of Visipaque dye at 12 mL/second. The overall LVEF estimated  70 % Without wall motion abnormalities   CABG 11/20/2013 Preoperative Diagnosis:  Severe Left Main Coronary Artery Disease  Left Lung Nodule  Postoperative Diagnosis:Same  Procedure:   Off-pump Coronary Artery Bypass Grafting x 2 Left Internal Mammary Artery to Distal Left Anterior Descending Coronary Artery Saphenous Vein Graft to First Obtuse Marginal Branch of Left Circumflex Coronary Artery Endoscopic Vein Harvest from Right Thigh   Myoview 01/25/2015 Study Highlights     Nuclear stress EF: 59%.  The left ventricular ejection fraction is normal (55-65%).  There was no ST segment  deviation noted during stress.  No T wave inversion was noted during stress.  The study is normal.  This is a low risk study.  Low risk stress nuclear study with normal perfusion and normal left ventricular regional and global systolic function.      Review of the above records demonstrates:  2v CABG in 2015, negative myoview 6 month ago to assess DOE, however now has chest pressure for 2 weeks    ASSESSMENT AND PLAN:  1.  Chest pain: does not appears to be exertional in nature, however similar to previous angina except no arm pain  - discussed with DOD Dr. Acie Fredrickson regarding usefulness of repeating myoview after only 6 month vs cardiac cath, will repeat myoview. EKG reviewed, does have inferolateral TWI, the lateral TWI is new compare to last EKG in 01/2015, however inferior lateral TWI was present on prior EKG in 07/2014.   2. CAD s/p CABG 11/20/2013 LIMA to LAD and SVG to circumflex: start coreg 6.'25mg'$  BID for antiangina  3. PAD s/p bilateral iliac stenting remotely: no leg pain  4. carotid artery disease: no obvious bruit on exam  5. Hypertension: SBP 150, will add coreg 6. Hyperlipidemia: intolerant to statins, per patient, he has tried all statins.    Current medicines are reviewed at length with the patient today.  The patient does not have concerns regarding medicines.  The following changes have been made:  no change  Labs/ tests ordered today include:   Orders Placed This Encounter  Procedures  . Myocardial Perfusion Imaging  . EKG 12-Lead     Disposition:   FU with cardiology in 4 weeks  Signed, Almyra Deforest, [place]  08/03/2015 9:29 PM    [address], [place], [place]  [address] Phone: [phone]; Fax: [phone]

## 2015-08-04 ENCOUNTER — Telehealth (HOSPITAL_COMMUNITY): Payer: Self-pay | Admitting: *Deleted

## 2015-08-04 NOTE — Telephone Encounter (Signed)
Left message on voicemail per DPR in reference to upcoming appointment scheduled on 08/09/15 at 0715 with detailed instructions given per Myocardial Perfusion Study Information Sheet for the test. LM to arrive 15 minutes early, and that it is imperative to arrive on time for appointment to keep from having the test rescheduled. If you need to cancel or reschedule your appointment, please call the office within 24 hours of your appointment. Failure to do so may result in a cancellation of your appointment, and a $50 no show fee. Phone number given for call back for any questions.Mayara Paulson, [place]anae [place]

## 2015-08-05 ENCOUNTER — Ambulatory Visit: Payer: Medicare Other | Admitting: Physician Assistant

## 2015-08-09 ENCOUNTER — Ambulatory Visit (HOSPITAL_COMMUNITY): Payer: Medicare Other | Attending: Cardiology

## 2015-08-09 DIAGNOSIS — I10 Essential (primary) hypertension: Secondary | ICD-10-CM | POA: Insufficient documentation

## 2015-08-09 DIAGNOSIS — I779 Disorder of arteries and arterioles, unspecified: Secondary | ICD-10-CM | POA: Insufficient documentation

## 2015-08-09 DIAGNOSIS — I25709 Atherosclerosis of coronary artery bypass graft(s), unspecified, with unspecified angina pectoris: Secondary | ICD-10-CM | POA: Diagnosis not present

## 2015-08-09 DIAGNOSIS — R0789 Other chest pain: Secondary | ICD-10-CM | POA: Insufficient documentation

## 2015-08-09 DIAGNOSIS — I739 Peripheral vascular disease, unspecified: Secondary | ICD-10-CM | POA: Diagnosis not present

## 2015-08-09 LAB — MYOCARDIAL PERFUSION IMAGING
CHL CUP NUCLEAR SDS: 4
CHL CUP RESTING HR STRESS: 63 {beats}/min
LV sys vol: 36 mL
LVDIAVOL: 93 mL (ref 62–150)
Peak HR: 81 {beats}/min
RATE: 0.25
SRS: 5
SSS: 9
TID: 0.99

## 2015-08-09 MED ORDER — REGADENOSON 0.4 MG/5ML IV SOLN
0.4000 mg | Freq: Once | INTRAVENOUS | Status: AC
  Administered 2015-08-09: 0.4 mg via INTRAVENOUS

## 2015-08-09 MED ORDER — TECHNETIUM TC 99M SESTAMIBI GENERIC - CARDIOLITE
32.8000 | Freq: Once | INTRAVENOUS | Status: AC | PRN
  Administered 2015-08-09: 32.8 via INTRAVENOUS

## 2015-08-09 MED ORDER — TECHNETIUM TC 99M SESTAMIBI GENERIC - CARDIOLITE
10.1000 | Freq: Once | INTRAVENOUS | Status: AC | PRN
  Administered 2015-08-09: 10.1 via INTRAVENOUS

## 2015-08-11 DIAGNOSIS — R0602 Shortness of breath: Secondary | ICD-10-CM | POA: Diagnosis not present

## 2015-08-11 DIAGNOSIS — J09X2 Influenza due to identified novel influenza A virus with other respiratory manifestations: Secondary | ICD-10-CM | POA: Diagnosis not present

## 2015-08-11 DIAGNOSIS — J111 Influenza due to unidentified influenza virus with other respiratory manifestations: Secondary | ICD-10-CM | POA: Diagnosis not present

## 2015-08-11 DIAGNOSIS — R509 Fever, unspecified: Secondary | ICD-10-CM | POA: Diagnosis not present

## 2015-08-17 DIAGNOSIS — J181 Lobar pneumonia, unspecified organism: Secondary | ICD-10-CM | POA: Diagnosis not present

## 2015-08-18 DIAGNOSIS — J181 Lobar pneumonia, unspecified organism: Secondary | ICD-10-CM | POA: Diagnosis not present

## 2015-08-18 DIAGNOSIS — J441 Chronic obstructive pulmonary disease with (acute) exacerbation: Secondary | ICD-10-CM | POA: Diagnosis not present

## 2015-08-20 DIAGNOSIS — J069 Acute upper respiratory infection, unspecified: Secondary | ICD-10-CM | POA: Diagnosis not present

## 2015-08-20 DIAGNOSIS — R0602 Shortness of breath: Secondary | ICD-10-CM | POA: Diagnosis not present

## 2015-08-20 DIAGNOSIS — J441 Chronic obstructive pulmonary disease with (acute) exacerbation: Secondary | ICD-10-CM | POA: Diagnosis not present

## 2015-08-23 DIAGNOSIS — Z951 Presence of aortocoronary bypass graft: Secondary | ICD-10-CM | POA: Diagnosis not present

## 2015-08-23 DIAGNOSIS — Z79899 Other long term (current) drug therapy: Secondary | ICD-10-CM | POA: Diagnosis not present

## 2015-08-23 DIAGNOSIS — Z888 Allergy status to other drugs, medicaments and biological substances status: Secondary | ICD-10-CM | POA: Diagnosis not present

## 2015-08-23 DIAGNOSIS — J111 Influenza due to unidentified influenza virus with other respiratory manifestations: Secondary | ICD-10-CM | POA: Diagnosis present

## 2015-08-23 DIAGNOSIS — J441 Chronic obstructive pulmonary disease with (acute) exacerbation: Secondary | ICD-10-CM | POA: Diagnosis not present

## 2015-08-23 DIAGNOSIS — Z902 Acquired absence of lung [part of]: Secondary | ICD-10-CM | POA: Diagnosis not present

## 2015-08-23 DIAGNOSIS — Z8673 Personal history of transient ischemic attack (TIA), and cerebral infarction without residual deficits: Secondary | ICD-10-CM | POA: Diagnosis not present

## 2015-08-23 DIAGNOSIS — Z85118 Personal history of other malignant neoplasm of bronchus and lung: Secondary | ICD-10-CM | POA: Diagnosis not present

## 2015-08-23 DIAGNOSIS — Z9861 Coronary angioplasty status: Secondary | ICD-10-CM | POA: Diagnosis not present

## 2015-08-23 DIAGNOSIS — M1990 Unspecified osteoarthritis, unspecified site: Secondary | ICD-10-CM | POA: Diagnosis present

## 2015-08-23 DIAGNOSIS — R0602 Shortness of breath: Secondary | ICD-10-CM | POA: Diagnosis not present

## 2015-08-23 DIAGNOSIS — J449 Chronic obstructive pulmonary disease, unspecified: Secondary | ICD-10-CM | POA: Diagnosis not present

## 2015-08-23 DIAGNOSIS — Z7982 Long term (current) use of aspirin: Secondary | ICD-10-CM | POA: Diagnosis not present

## 2015-08-23 DIAGNOSIS — I10 Essential (primary) hypertension: Secondary | ICD-10-CM | POA: Diagnosis present

## 2015-08-23 DIAGNOSIS — R0902 Hypoxemia: Secondary | ICD-10-CM | POA: Diagnosis not present

## 2015-08-23 DIAGNOSIS — J45909 Unspecified asthma, uncomplicated: Secondary | ICD-10-CM | POA: Diagnosis present

## 2015-08-23 DIAGNOSIS — E039 Hypothyroidism, unspecified: Secondary | ICD-10-CM | POA: Diagnosis present

## 2015-08-23 DIAGNOSIS — I25810 Atherosclerosis of coronary artery bypass graft(s) without angina pectoris: Secondary | ICD-10-CM | POA: Diagnosis present

## 2015-08-23 DIAGNOSIS — R0689 Other abnormalities of breathing: Secondary | ICD-10-CM | POA: Diagnosis not present

## 2015-08-23 DIAGNOSIS — R05 Cough: Secondary | ICD-10-CM | POA: Diagnosis not present

## 2015-08-23 DIAGNOSIS — Z87891 Personal history of nicotine dependence: Secondary | ICD-10-CM | POA: Diagnosis not present

## 2015-08-30 DIAGNOSIS — Z85118 Personal history of other malignant neoplasm of bronchus and lung: Secondary | ICD-10-CM | POA: Diagnosis not present

## 2015-08-30 DIAGNOSIS — B370 Candidal stomatitis: Secondary | ICD-10-CM

## 2015-08-30 DIAGNOSIS — J61 Pneumoconiosis due to asbestos and other mineral fibers: Secondary | ICD-10-CM | POA: Diagnosis not present

## 2015-08-30 DIAGNOSIS — M810 Age-related osteoporosis without current pathological fracture: Secondary | ICD-10-CM | POA: Diagnosis not present

## 2015-08-30 DIAGNOSIS — K1370 Unspecified lesions of oral mucosa: Secondary | ICD-10-CM | POA: Diagnosis not present

## 2015-08-30 DIAGNOSIS — C3412 Malignant neoplasm of upper lobe, left bronchus or lung: Secondary | ICD-10-CM | POA: Diagnosis not present

## 2015-08-30 DIAGNOSIS — J449 Chronic obstructive pulmonary disease, unspecified: Secondary | ICD-10-CM | POA: Diagnosis not present

## 2015-08-30 DIAGNOSIS — I2510 Atherosclerotic heart disease of native coronary artery without angina pectoris: Secondary | ICD-10-CM | POA: Diagnosis not present

## 2015-08-30 DIAGNOSIS — R970 Elevated carcinoembryonic antigen [CEA]: Secondary | ICD-10-CM | POA: Diagnosis not present

## 2015-08-30 DIAGNOSIS — D696 Thrombocytopenia, unspecified: Secondary | ICD-10-CM

## 2015-08-30 DIAGNOSIS — M4854XA Collapsed vertebra, not elsewhere classified, thoracic region, initial encounter for fracture: Secondary | ICD-10-CM | POA: Diagnosis not present

## 2015-08-30 DIAGNOSIS — I509 Heart failure, unspecified: Secondary | ICD-10-CM | POA: Diagnosis not present

## 2015-08-30 DIAGNOSIS — N189 Chronic kidney disease, unspecified: Secondary | ICD-10-CM

## 2015-08-31 DIAGNOSIS — J441 Chronic obstructive pulmonary disease with (acute) exacerbation: Secondary | ICD-10-CM | POA: Diagnosis not present

## 2015-09-02 ENCOUNTER — Ambulatory Visit (INDEPENDENT_AMBULATORY_CARE_PROVIDER_SITE_OTHER): Payer: Medicare Other | Admitting: Cardiovascular Disease

## 2015-09-02 ENCOUNTER — Encounter: Payer: Self-pay | Admitting: Cardiovascular Disease

## 2015-09-02 VITALS — BP 160/86 | HR 76 | Ht 68.0 in | Wt 135.4 lb

## 2015-09-02 DIAGNOSIS — I10 Essential (primary) hypertension: Secondary | ICD-10-CM | POA: Diagnosis not present

## 2015-09-02 DIAGNOSIS — I779 Disorder of arteries and arterioles, unspecified: Secondary | ICD-10-CM | POA: Diagnosis not present

## 2015-09-02 DIAGNOSIS — I2510 Atherosclerotic heart disease of native coronary artery without angina pectoris: Secondary | ICD-10-CM

## 2015-09-02 DIAGNOSIS — I25709 Atherosclerosis of coronary artery bypass graft(s), unspecified, with unspecified angina pectoris: Secondary | ICD-10-CM | POA: Diagnosis not present

## 2015-09-02 DIAGNOSIS — I739 Peripheral vascular disease, unspecified: Secondary | ICD-10-CM | POA: Diagnosis not present

## 2015-09-02 NOTE — Assessment & Plan Note (Signed)
History of CAD status post RCA stenting in 1998. I performed cardiac catheterization on hip 11/05/13 via the right radial approach demonstrating left main/three-vessel disease. He ultimately underwent coronary bypass grafting by Dr. Roxy Manns 11/20/13 with a LIMA to his LAD, vein to the circumflex coronary artery. He recently saw Almyra Deforest in the office with chest pain and a Myoview stress test was performed that on 08/09/15 revealed normal LV systolic function with normal perfusion. His chest pain has since resolved. He was recently admitted to the end of hospital with the flu and pneumonia and had lower extremity edema which responded well to doubling his diuretics.

## 2015-09-02 NOTE — Assessment & Plan Note (Signed)
History of carotid artery disease status post Doppler study performed 02/01/15 with a moderate right ICA stenosis. He is neurologically symptomatic. We'll follow this by duplex ultrasound on annual basis.

## 2015-09-02 NOTE — Assessment & Plan Note (Signed)
History of peripheral arterial disease status post remote bilateral iliac stenting with Dopplers performed 03/24/13 revealing normal ABIs with moderately elevated velocities in both iliac arteries although the patient denies claudication.

## 2015-09-02 NOTE — Assessment & Plan Note (Signed)
History of hyperlipidemia on red yeast rice followed by his PCP 

## 2015-09-02 NOTE — Assessment & Plan Note (Signed)
History of hypertension blood pressure measured at 160/86. He is on carvedilol and lisinopril. Continue current meds at current dosing

## 2015-09-02 NOTE — Progress Notes (Signed)
09/02/2015 Glenn Reilly   December 01, 1928  [id number]  Primary Physician Glenn Roger, MD Primary Cardiologist: Lorretta Harp MD Renae Gloss   HPI:  Glenn Reilly is an 82 year old thin appearing widowed Caucasian male father of one son who accompanies him today. I last saw him in the office 02/04/15.Marland Kitchen He was referred by Dr. Vassie Loll Eyk His cardiac risk factor profile is remarkable for remote tobacco abuse, hyperlipidemia and hypertension. He has never had a heart attack but did have a stroke in 2011 at the time of left carotid endarterectomy. He said coronary artery disease with stenting in 1999 performed by Dr. Eustace Quail and again 2004. His left calf because k patent stent with moderate LAD and circumflex disease. He does have diastolic dysfunction. He has had lung cancer and right lung resection by Dr. Baldemar Friday in 2001. On Thursday night after mowing his lawn on a riding lower developed back pain rating to his chest associated with dyspnea on exertion. This resolved throughout the night has not recurred. He did have apparently inferior T-wave inversion on EKG today which was new. A Myoview stress test was normal as was a 2-D echo echocardiogram. Lower extremity arterial Doppler studies showed ankle-brachial indices of greater than one bilaterally with a suggestion of mild to moderate iliac disease. He developed exertional left upper extremity discomfort with dyspnea over the last month which is fairly reproducible and similar to his prior symptoms before coronary intervention. Based on this, I performed outpatient cardiac catheterization on 11/05/13 in the right radial approach. I demonstrated 50% left main, high-grade proximal LAD, ostial circumflex and moderate mid RCA disease with normal LV function. Based on this, I suggested coronary artery bypass grafting is the best revascularization strategy especially in light of his pulmonary nodule which could potentially be biopsied  at the time of surgery. This was performed by Dr. Roxy Manns on 11/20/13 with a LIMA to his LAD and a vein to the circumflex coronary artery. He also had a Tru-Cut needle biopsy of his left upper lobe nodule which turned out to be some squamous cell carcinoma. He has received radiation therapy for this. He did participate in cardiac rehabilitation. A chest CT showed diminished size of his nodule. He was hospitalized for pneumonia and was told he had "congestive heart failure. A BNP was mildly elevated. He did have lower extremity edema and his Lasix was increased. A subsequent 2-D echo performed in March of last year showed preserved LV function with diastolic dysfunction. He saw Almyra Deforest [facility]  in the office recently complaining of chest pain and a Myoview stress test was performed which showed normal LV systolic function with normal perfusion. His chest pain has subsequently resolved. He was hospitalized at [facility] last month with pneumonia and flu. He had some increased lotion edema which responded to adjusting his diuretic dose.  Current Outpatient Prescriptions  Medication Sig Dispense Refill  . aspirin 81 MG tablet Take 81 mg by mouth daily.    . carvedilol (COREG) 6.25 MG tablet Take 1 tablet (6.25 mg total) by mouth 2 (two) times daily. 60 tablet 6  . furosemide (LASIX) 40 MG tablet Take 1 tablet (40 mg total) by mouth daily. 90 tablet 3  . ipratropium (ATROVENT) 0.03 % nasal spray Place 1 spray into both nostrils 2 (two) times daily.   12  . ipratropium-albuterol (DUONEB) 0.5-2.5 (3) MG/3ML SOLN Take 3 mLs by nebulization 3 (three) times daily.    Marland Kitchen levothyroxine (  SYNTHROID, LEVOTHROID) 75 MCG tablet Take 75 mcg by mouth daily before breakfast.    . lisinopril (PRINIVIL,ZESTRIL) 40 MG tablet 40 mg once.  11  . predniSONE (DELTASONE) 10 MG tablet Take 10 mg by mouth daily with breakfast.    . Red Yeast Rice 600 MG CAPS Take 1 capsule by mouth 2 (two) times daily.    Marland Kitchen albuterol (PROVENTIL  HFA;VENTOLIN HFA) 108 (90 BASE) MCG/ACT inhaler Inhale 2 puffs into the lungs every 6 (six) hours as needed for wheezing or shortness of breath.    . doxycycline (VIBRAMYCIN) 100 MG capsule TK 1 C PO BID  0   No current facility-administered medications for this visit.    Allergies  Allergen Reactions  . Demerol [Meperidine] Other (See Comments)    shock  . Statins Other (See Comments)    Social History   Social History  . Marital Status: Widowed    Spouse Name: N/A  . Number of Children: 1  . Years of Education: N/A   Occupational History  . 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 Smoking status: Former Smoker -- 1.00 packs/day for 30 years    Types: Cigarettes    Quit date: 03/03/1979  . Smokeless tobacco: Current User    Types: Chew  . Alcohol Use: No  . Drug Use: No  . Sexual Activity: Not on file   Other Topics Concern  . Not on file   Social History Narrative     Review of Systems: General: negative for chills, fever, night sweats or weight changes.  Cardiovascular: negative for chest pain, dyspnea on exertion, edema, orthopnea, palpitations, paroxysmal nocturnal dyspnea or shortness of breath Dermatological: negative for rash Respiratory: negative for cough or wheezing Urologic: negative for hematuria Abdominal: negative for nausea, vomiting, diarrhea, bright red blood per rectum, melena, or hematemesis Neurologic: negative for visual changes, syncope, or dizziness All other systems reviewed and are otherwise negative except as noted above.    Blood pressure 160/86, pulse 76, height '5\' 8"'$  (1.727 m), weight 135 lb 6 oz (61.406 kg).  General appearance: alert and no distress Neck: no adenopathy, no carotid bruit, no JVD, supple, symmetrical, trachea midline and thyroid not enlarged, symmetric, no tenderness/mass/nodules Lungs: clear to auscultation bilaterally Heart: regular rate and rhythm, S1, S2 normal, no  murmur, click, rub or gallop Extremities: extremities normal, atraumatic, no cyanosis or edema  EKG not performed today  ASSESSMENT AND PLAN:   HYPERLIPIDEMIA-MIXED History of hyperlipidemia on red yeast rice followed by his PCP  HYPERTENSION, BENIGN History of hypertension blood pressure measured at 160/86. He is on carvedilol and lisinopril. Continue current meds at current dosing  CAD, NATIVE VESSEL History of CAD status post RCA stenting in 1998. I performed cardiac catheterization on hip 11/05/13 via the right radial approach demonstrating left main/three-vessel disease. He ultimately underwent coronary bypass grafting by Dr. Roxy Manns 11/20/13 with a LIMA to his LAD, vein to the circumflex coronary artery. He recently saw Almyra Deforest in the office with chest pain and a Myoview stress test was performed that on 08/09/15 revealed normal LV systolic function with normal perfusion. His chest pain has since resolved. He was recently admitted to the end of hospital with the flu and pneumonia and had lower extremity edema which responded well to doubling his diuretics.  Peripheral vascular disease History of peripheral arterial disease status post remote bilateral iliac stenting with Dopplers performed 03/24/13 revealing normal ABIs  with moderately elevated velocities in both iliac arteries although the patient denies claudication.  Carotid artery disease History of carotid artery disease status post Doppler study performed 02/01/15 with a moderate right ICA stenosis. He is neurologically symptomatic. We'll follow this by duplex ultrasound on annual basis.      Lorretta Harp MD FACP,FACC,FAHA, [facility] 09/02/2015 10:10 AM

## 2015-09-02 NOTE — Patient Instructions (Signed)
Medication Instructions:  Your physician recommends that you continue on your current medications as directed. Please refer to the Current Medication list given to you today.   Labwork: none  Testing/Procedures: none  Follow-Up: We request that you follow-up in: 3 months (at [facility] office) with an extender and in 6 months with Dr Andria Rhein will receive a reminder letter in the mail two months in advance. If you don't receive a letter, please call our office to schedule the follow-up appointment.    Any Other Special Instructions Will Be Listed Below (If Applicable).     If you need a refill on your cardiac medications before your next appointment, please call your pharmacy.

## 2015-09-06 DIAGNOSIS — R05 Cough: Secondary | ICD-10-CM | POA: Diagnosis not present

## 2015-09-06 DIAGNOSIS — R062 Wheezing: Secondary | ICD-10-CM | POA: Diagnosis not present

## 2015-09-13 NOTE — Addendum Note (Signed)
Addended by: Therisa Doyne on: 09/13/2015 05:21 PM   Modules accepted: Orders

## 2015-09-19 DIAGNOSIS — H15101 Unspecified episcleritis, right eye: Secondary | ICD-10-CM | POA: Diagnosis not present

## 2015-09-22 DIAGNOSIS — R1312 Dysphagia, oropharyngeal phase: Secondary | ICD-10-CM | POA: Insufficient documentation

## 2015-09-22 DIAGNOSIS — J449 Chronic obstructive pulmonary disease, unspecified: Secondary | ICD-10-CM | POA: Insufficient documentation

## 2015-09-22 DIAGNOSIS — J4489 Other specified chronic obstructive pulmonary disease: Secondary | ICD-10-CM | POA: Insufficient documentation

## 2015-09-22 DIAGNOSIS — J984 Other disorders of lung: Secondary | ICD-10-CM | POA: Diagnosis not present

## 2015-09-22 DIAGNOSIS — J45909 Unspecified asthma, uncomplicated: Secondary | ICD-10-CM | POA: Diagnosis not present

## 2015-09-26 DIAGNOSIS — H15101 Unspecified episcleritis, right eye: Secondary | ICD-10-CM | POA: Diagnosis not present

## 2015-09-26 DIAGNOSIS — H02054 Trichiasis without entropian left upper eyelid: Secondary | ICD-10-CM | POA: Diagnosis not present

## 2015-10-11 DIAGNOSIS — L570 Actinic keratosis: Secondary | ICD-10-CM | POA: Diagnosis not present

## 2015-11-08 NOTE — Addendum Note (Signed)
Addended by: Therisa Doyne on: 11/08/2015 04:10 PM   Modules accepted: Orders

## 2015-11-11 DIAGNOSIS — H02054 Trichiasis without entropian left upper eyelid: Secondary | ICD-10-CM | POA: Diagnosis not present

## 2015-11-11 DIAGNOSIS — H179 Unspecified corneal scar and opacity: Secondary | ICD-10-CM | POA: Diagnosis not present

## 2015-11-16 DIAGNOSIS — J449 Chronic obstructive pulmonary disease, unspecified: Secondary | ICD-10-CM | POA: Diagnosis not present

## 2015-11-16 DIAGNOSIS — J45909 Unspecified asthma, uncomplicated: Secondary | ICD-10-CM | POA: Diagnosis not present

## 2015-11-16 DIAGNOSIS — J984 Other disorders of lung: Secondary | ICD-10-CM | POA: Diagnosis not present

## 2015-11-24 DIAGNOSIS — H524 Presbyopia: Secondary | ICD-10-CM | POA: Diagnosis not present

## 2015-12-02 ENCOUNTER — Encounter: Payer: Self-pay | Admitting: Cardiovascular Disease

## 2015-12-02 ENCOUNTER — Ambulatory Visit (INDEPENDENT_AMBULATORY_CARE_PROVIDER_SITE_OTHER): Payer: Medicare Other | Admitting: Cardiovascular Disease

## 2015-12-02 VITALS — BP 110/60 | HR 60 | Ht 68.0 in | Wt 138.4 lb

## 2015-12-02 DIAGNOSIS — I10 Essential (primary) hypertension: Secondary | ICD-10-CM | POA: Diagnosis not present

## 2015-12-02 DIAGNOSIS — I779 Disorder of arteries and arterioles, unspecified: Secondary | ICD-10-CM | POA: Diagnosis not present

## 2015-12-02 DIAGNOSIS — I2510 Atherosclerotic heart disease of native coronary artery without angina pectoris: Secondary | ICD-10-CM | POA: Diagnosis not present

## 2015-12-02 DIAGNOSIS — I739 Peripheral vascular disease, unspecified: Secondary | ICD-10-CM

## 2015-12-02 DIAGNOSIS — I25709 Atherosclerosis of coronary artery bypass graft(s), unspecified, with unspecified angina pectoris: Secondary | ICD-10-CM | POA: Diagnosis not present

## 2015-12-02 NOTE — Assessment & Plan Note (Signed)
History of CAD status post RCA stenting in 1998. I performed cardiac catheterization on him 11/05/13 via the right radial approach demonstrating left main/three-vessel disease. He also underwent coronary artery bypass grafting by Dr. Roxy Manns 11/21/15 with a LIMA to LAD, vein to the circumflex coronary artery. He had a low risk Myoview performed 08/01/15. He denies chest pain and has chronic unchanged shortness of breath.

## 2015-12-02 NOTE — Assessment & Plan Note (Addendum)
History of left carotid endarterectomy in the past. Carotid Dopplers performed September 2016 showed moderate bilateral ICA stenosis right greater than left. We will follow this on manual basis.

## 2015-12-02 NOTE — Assessment & Plan Note (Signed)
History of hypertension with blood pressures measured 110/60. He is on lisinopril. Continue current meds at current dosing

## 2015-12-02 NOTE — Assessment & Plan Note (Signed)
History of hyperlipidemia on red yeast rice followed by his PCP 

## 2015-12-02 NOTE — Progress Notes (Signed)
12/02/2015 Alyson Locket Branscomb   1928-07-27  [id number]  Primary Physician Townsend Roger, MD Primary Cardiologist: Lorretta Harp MD Renae Gloss  HPI:   Mr. Mahabir is an 82 year old thin appearing widowed Caucasian male father of one son who accompanies him today. I last saw him in the office 09/02/15.Marland Kitchen He was referred by Dr. Vassie Loll Eyk His cardiac risk factor profile is remarkable for remote tobacco abuse, hyperlipidemia and hypertension. He has never had a heart attack but did have a stroke in 2011 at the time of left carotid endarterectomy. He said coronary artery disease with stenting in 1999 performed by Dr. Eustace Quail and again 2004. His left calf because k patent stent with moderate LAD and circumflex disease. He does have diastolic dysfunction. He has had lung cancer and right lung resection by Dr. Baldemar Friday in 2001. On Thursday night after mowing his lawn on a riding lower developed back pain rating to his chest associated with dyspnea on exertion. This resolved throughout the night has not recurred. He did have apparently inferior T-wave inversion on EKG today which was new. A Myoview stress test was normal as was a 2-D echo echocardiogram. Lower extremity arterial Doppler studies showed ankle-brachial indices of greater than one bilaterally with a suggestion of mild to moderate iliac disease. He developed exertional left upper extremity discomfort with dyspnea over the last month which is fairly reproducible and similar to his prior symptoms before coronary intervention. Based on this, I performed outpatient cardiac catheterization on 11/05/13 in the right radial approach. I demonstrated 50% left main, high-grade proximal LAD, ostial circumflex and moderate mid RCA disease with normal LV function. Based on this, I suggested coronary artery bypass grafting is the best revascularization strategy especially in light of his pulmonary nodule which could potentially be biopsied  at the time of surgery. This was performed by Dr. Roxy Manns on 11/20/13 with a LIMA to his LAD and a vein to the circumflex coronary artery. He also had a Tru-Cut needle biopsy of his left upper lobe nodule which turned out to be some squamous cell carcinoma. He has received radiation therapy for this. He did participate in cardiac rehabilitation. A chest CT showed diminished size of his nodule. He was hospitalized for pneumonia and was told he had "congestive heart failure. A BNP was mildly elevated. He did have lower extremity edema and his Lasix was increased. A subsequent 2-D echo performed in March of last year showed preserved LV function with diastolic dysfunction. He saw Almyra Deforest [facility] in the office recently complaining of chest pain and a Myoview stress test was performed which showed normal LV systolic function with normal perfusion. His chest pain has subsequently resolved. He was hospitalized at [facility] last month with pneumonia and flu. He had some increased lotion edema which responded to adjusting his diuretic dose. Since I saw him in the office 3 months ago he remained clinically stable.   Current Outpatient Prescriptions  Medication Sig Dispense Refill  . albuterol (PROVENTIL HFA;VENTOLIN HFA) 108 (90 BASE) MCG/ACT inhaler Inhale 2 puffs into the lungs every 6 (six) hours as needed for wheezing or shortness of breath.    Marland Kitchen aspirin 81 MG tablet Take 81 mg by mouth daily.    . furosemide (LASIX) 40 MG tablet Take 1 tablet (40 mg total) by mouth daily. 90 tablet 3  . ipratropium (ATROVENT) 0.03 % nasal spray Place 1 spray into both nostrils 2 (two) times daily.  12  . ipratropium-albuterol (DUONEB) 0.5-2.5 (3) MG/3ML SOLN Take 3 mLs by nebulization 3 (three) times daily.    Marland Kitchen levothyroxine (SYNTHROID, LEVOTHROID) 75 MCG tablet Take 75 mcg by mouth daily before breakfast.    . lisinopril (PRINIVIL,ZESTRIL) 40 MG tablet 40 mg once.  11  . predniSONE (DELTASONE) 10 MG tablet Take 10 mg by  mouth daily with breakfast.    . Red Yeast Rice 600 MG CAPS Take 1 capsule by mouth 2 (two) times daily.    Marland Kitchen FLOVENT HFA 110 MCG/ACT inhaler INL 1 PUFF PO BID  11   No current facility-administered medications for this visit.    Allergies  Allergen Reactions  . Demerol [Meperidine] Other (See Comments)    Other reaction(s): Shock (ALLERGY) shock  . Statins Other (See Comments)    Social History   Social History  . Marital Status: Widowed    Spouse Name: N/A  . Number of Children: 1  . Years of Education: N/A   Occupational History  . 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 Smoking status: Former Smoker -- 1.00 packs/day for 30 years    Types: Cigarettes    Quit date: 03/03/1979  . Smokeless tobacco: Current User    Types: Chew  . Alcohol Use: No  . Drug Use: No  . Sexual Activity: Not on file   Other Topics Concern  . Not on file   Social History Narrative     Review of Systems: General: negative for chills, fever, night sweats or weight changes.  Cardiovascular: negative for chest pain, dyspnea on exertion, edema, orthopnea, palpitations, paroxysmal nocturnal dyspnea or shortness of breath Dermatological: negative for rash Respiratory: negative for cough or wheezing Urologic: negative for hematuria Abdominal: negative for nausea, vomiting, diarrhea, bright red blood per rectum, melena, or hematemesis Neurologic: negative for visual changes, syncope, or dizziness All other systems reviewed and are otherwise negative except as noted above.    Blood pressure 110/60, pulse 60, height '5\' 8"'$  (1.727 m), weight 138 lb 6.4 oz (62.778 kg).  General appearance: alert and no distress Neck: no adenopathy, no carotid bruit, no JVD, supple, symmetrical, trachea midline and thyroid not enlarged, symmetric, no tenderness/mass/nodules Lungs: clear to auscultation bilaterally Heart: regular rate and rhythm, S1, S2 normal, no  murmur, click, rub or gallop Extremities: extremities normal, atraumatic, no cyanosis or edema  EKG not performed today  ASSESSMENT AND PLAN:   HYPERLIPIDEMIA-MIXED History of hyperlipidemia on red yeast rice followed by his PCP  HYPERTENSION, BENIGN History of hypertension with blood pressures measured 110/60. He is on lisinopril. Continue current meds at current dosing  CAD, NATIVE VESSEL History of CAD status post RCA stenting in 1998. I performed cardiac catheterization on him 11/05/13 via the right radial approach demonstrating left main/three-vessel disease. He also underwent coronary artery bypass grafting by Dr. Roxy Manns 11/21/15 with a LIMA to LAD, vein to the circumflex coronary artery. He had a low risk Myoview performed 08/01/15. He denies chest pain and has chronic unchanged shortness of breath.  Carotid artery disease History of left carotid endarterectomy in the past. Carotid Dopplers performed September 2016 showed moderate bilateral ICA stenosis right greater than left. We will follow this on manual basis.      Lorretta Harp MD FACP,FACC,FAHA, [facility] 12/02/2015 7:43 AM

## 2015-12-02 NOTE — Patient Instructions (Addendum)
Medication Instructions:  Your physician recommends that you continue on your current medications as directed. Please refer to the Current Medication list given to you today.   Labwork: Labwork will be requested from your primary care physician.   Testing/Procedures: Your physician has requested that you have a carotid duplex. This test is an ultrasound of the carotid arteries in your neck. It looks at blood flow through these arteries that supply the brain with blood. Allow one hour for this exam. There are no restrictions or special instructions. September 2017    Follow-Up: We request that you follow-up in: 6 MONTHS with an extender and in 12 MONTHS with Dr Andria Rhein will receive a reminder letter in the mail two months in advance. If you don't receive a letter, please call our office to schedule the follow-up appointment.    Any Other Special Instructions Will Be Listed Below (If Applicable).     If you need a refill on your cardiac medications before your next appointment, please call your pharmacy.

## 2015-12-06 DIAGNOSIS — Z85118 Personal history of other malignant neoplasm of bronchus and lung: Secondary | ICD-10-CM | POA: Diagnosis not present

## 2015-12-09 DIAGNOSIS — J984 Other disorders of lung: Secondary | ICD-10-CM | POA: Diagnosis not present

## 2015-12-09 DIAGNOSIS — J45909 Unspecified asthma, uncomplicated: Secondary | ICD-10-CM | POA: Diagnosis not present

## 2015-12-09 DIAGNOSIS — J449 Chronic obstructive pulmonary disease, unspecified: Secondary | ICD-10-CM | POA: Diagnosis not present

## 2015-12-09 DIAGNOSIS — Z85118 Personal history of other malignant neoplasm of bronchus and lung: Secondary | ICD-10-CM | POA: Diagnosis not present

## 2016-01-13 ENCOUNTER — Other Ambulatory Visit: Payer: Self-pay

## 2016-01-13 DIAGNOSIS — L570 Actinic keratosis: Secondary | ICD-10-CM | POA: Diagnosis not present

## 2016-01-13 DIAGNOSIS — L821 Other seborrheic keratosis: Secondary | ICD-10-CM | POA: Diagnosis not present

## 2016-01-13 DIAGNOSIS — L578 Other skin changes due to chronic exposure to nonionizing radiation: Secondary | ICD-10-CM | POA: Diagnosis not present

## 2016-01-13 DIAGNOSIS — C44622 Squamous cell carcinoma of skin of right upper limb, including shoulder: Secondary | ICD-10-CM | POA: Diagnosis not present

## 2016-01-16 ENCOUNTER — Other Ambulatory Visit: Payer: Self-pay | Admitting: Cardiovascular Disease

## 2016-01-24 ENCOUNTER — Ambulatory Visit: Payer: Medicare Other | Admitting: Cardiovascular Disease

## 2016-01-26 ENCOUNTER — Ambulatory Visit (HOSPITAL_COMMUNITY)
Admission: RE | Admit: 2016-01-26 | Discharge: 2016-01-26 | Disposition: A | Discharge status: Home / Self Care | Payer: Medicare Other | Source: Ambulatory Visit | Attending: Cardiology | Admitting: Cardiology

## 2016-01-26 DIAGNOSIS — Z951 Presence of aortocoronary bypass graft: Secondary | ICD-10-CM | POA: Diagnosis not present

## 2016-01-26 DIAGNOSIS — I10 Essential (primary) hypertension: Secondary | ICD-10-CM | POA: Diagnosis not present

## 2016-01-26 DIAGNOSIS — E785 Hyperlipidemia, unspecified: Secondary | ICD-10-CM | POA: Insufficient documentation

## 2016-01-26 DIAGNOSIS — I2510 Atherosclerotic heart disease of native coronary artery without angina pectoris: Secondary | ICD-10-CM

## 2016-01-26 DIAGNOSIS — C44622 Squamous cell carcinoma of skin of right upper limb, including shoulder: Secondary | ICD-10-CM | POA: Diagnosis not present

## 2016-01-26 DIAGNOSIS — I739 Peripheral vascular disease, unspecified: Secondary | ICD-10-CM | POA: Insufficient documentation

## 2016-01-26 DIAGNOSIS — I779 Disorder of arteries and arterioles, unspecified: Secondary | ICD-10-CM | POA: Insufficient documentation

## 2016-01-26 DIAGNOSIS — I6523 Occlusion and stenosis of bilateral carotid arteries: Secondary | ICD-10-CM | POA: Diagnosis not present

## 2016-01-26 DIAGNOSIS — J449 Chronic obstructive pulmonary disease, unspecified: Secondary | ICD-10-CM | POA: Insufficient documentation

## 2016-01-26 DIAGNOSIS — Z87891 Personal history of nicotine dependence: Secondary | ICD-10-CM | POA: Diagnosis not present

## 2016-01-30 ENCOUNTER — Other Ambulatory Visit: Payer: Self-pay | Admitting: *Deleted

## 2016-01-30 DIAGNOSIS — I779 Disorder of arteries and arterioles, unspecified: Secondary | ICD-10-CM

## 2016-01-30 DIAGNOSIS — I739 Peripheral vascular disease, unspecified: Secondary | ICD-10-CM

## 2016-02-28 DIAGNOSIS — M545 Low back pain: Secondary | ICD-10-CM | POA: Diagnosis not present

## 2016-03-05 IMAGING — CT CT CHEST W/ CM
2 of 3 series · 15 of 36 positions shown, 18 images · IV contrast (OMNIPAQUE)
Comparison: PET-CT scan 10/13/2013, CT scan 10/12/2013

CLINICAL DATA: Lung cancer status post radiation. Radiation therapy
in progress. Right lower lobectomy.

EXAM:
CT CHEST WITH CONTRAST
TECHNIQUE: Multidetector CT imaging of the chest was performed during
intravenous contrast administration.
CONTRAST:  50mL OMNIPAQUE IOHEXOL 300 MG/ML  SOLN

[Series 2: chest with st · axial · 0.69mm/px · z∈[-274,-14]mm · 12 of 62 slices shown, 15 images]
[im 5/62  mediastinal]
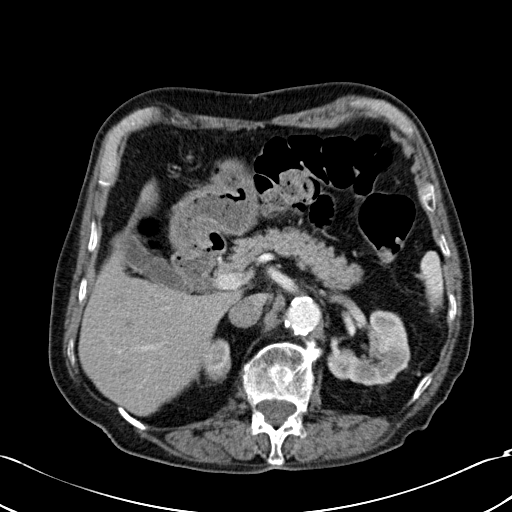
[im 5/62  lung]
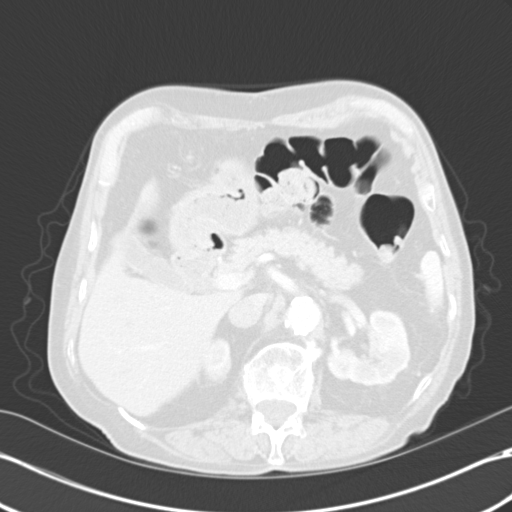
[im 10/62  lung]
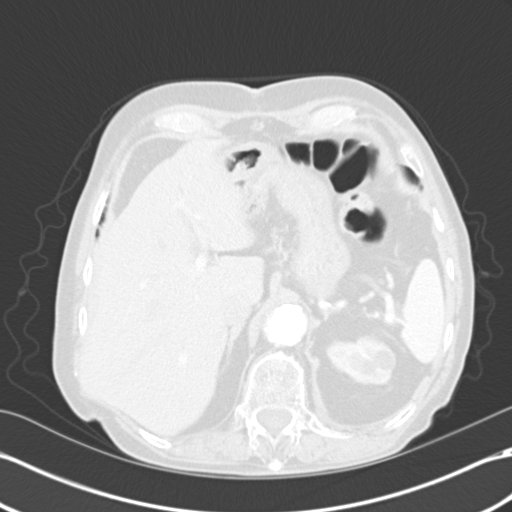
[im 14/62  lung]
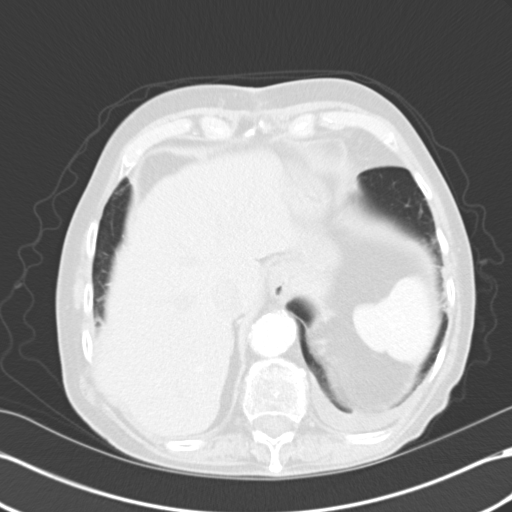
[im 19/62  lung]
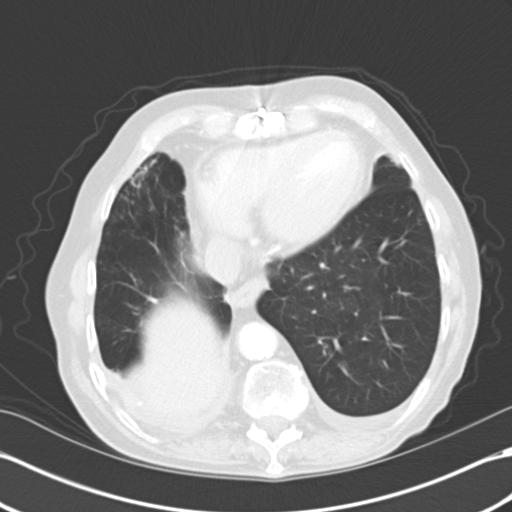
[im 23/62  mediastinal]
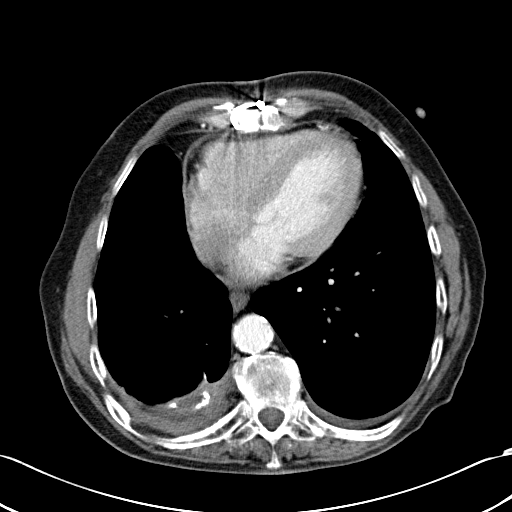
[im 23/62  lung]
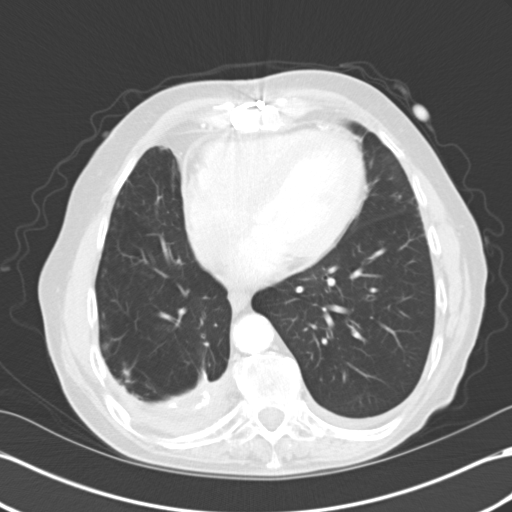
[im 28/62  lung]
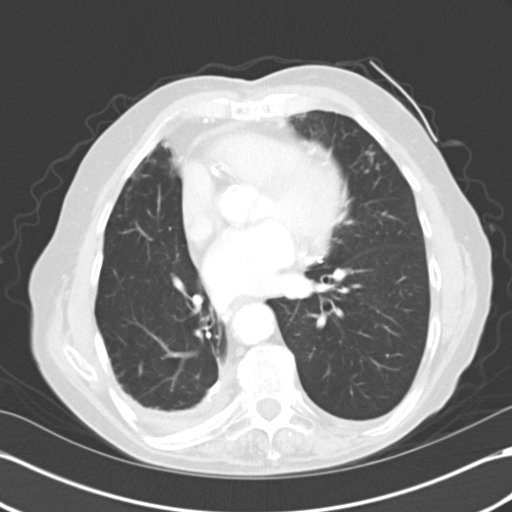
[im 34/62  lung]
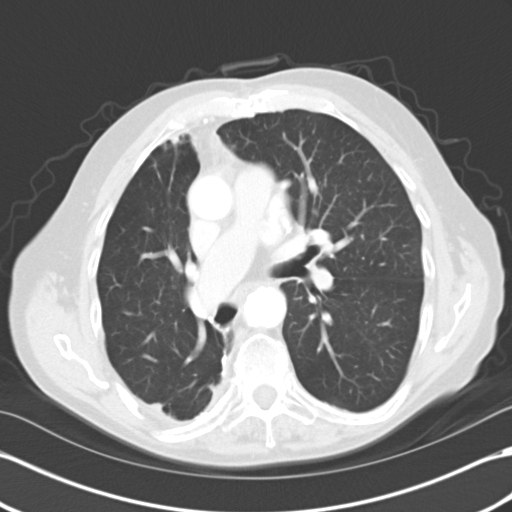
[im 39/62  lung]
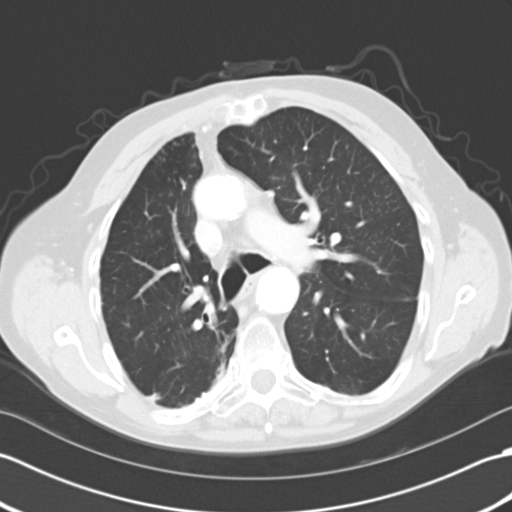
[im 43/62  mediastinal]
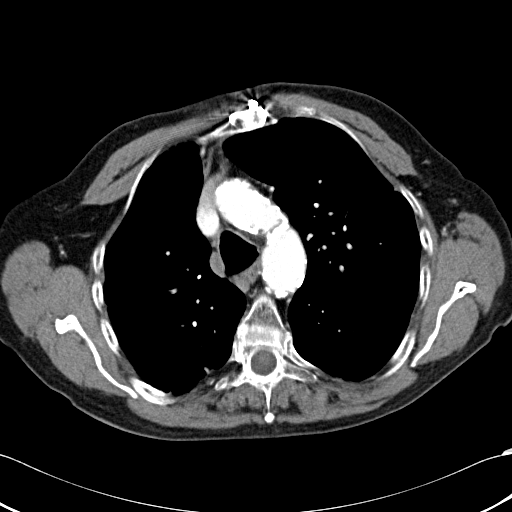
[im 43/62  lung]
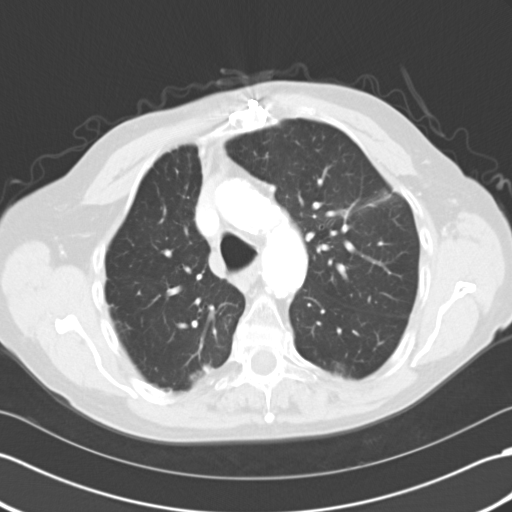
[im 48/62  lung]
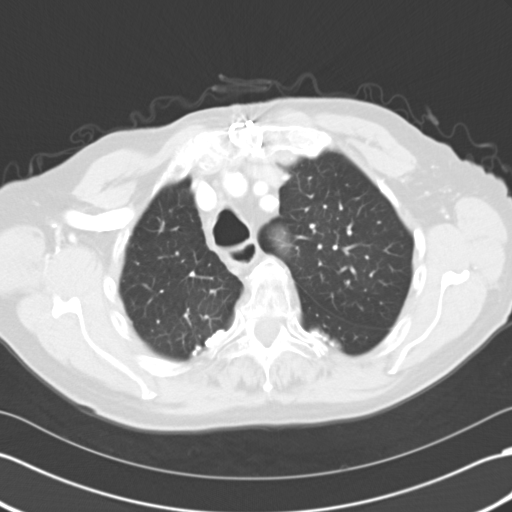
[im 52/62  lung]
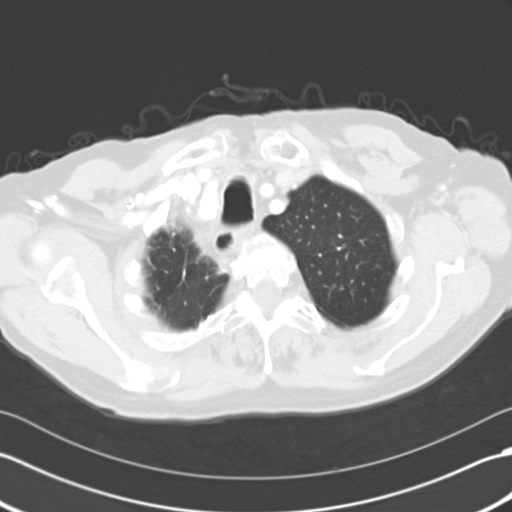
[im 57/62  lung]
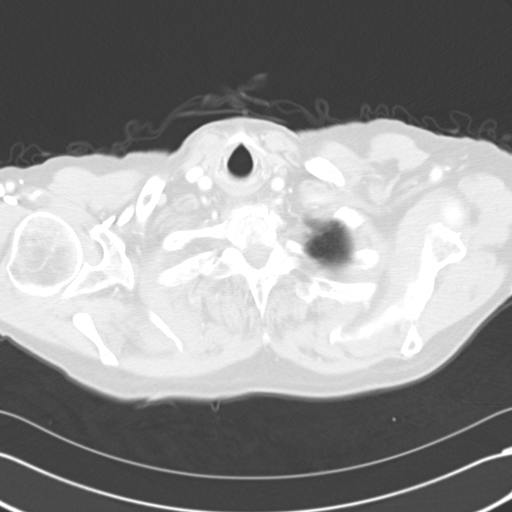

[Series 602: <mpr thick range> · coronal · 0.69mm/px · 3 of 94 slices shown]
[im 19/94  lung]
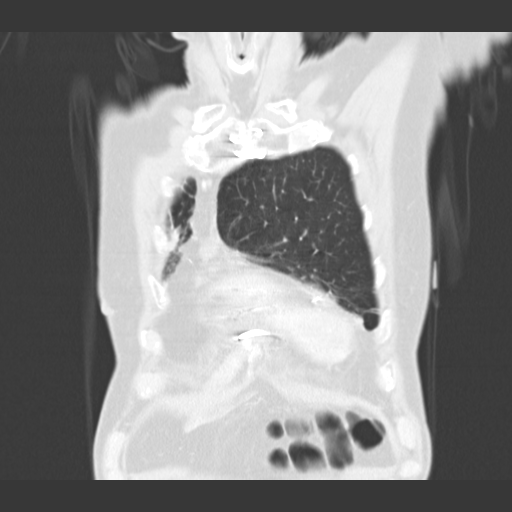
[im 38/94  lung]
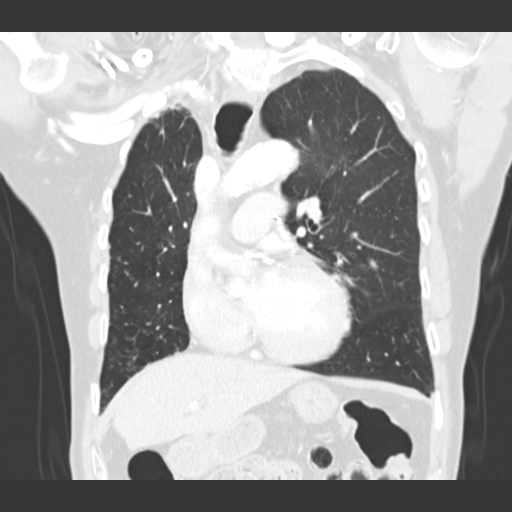
[im 56/94  lung]
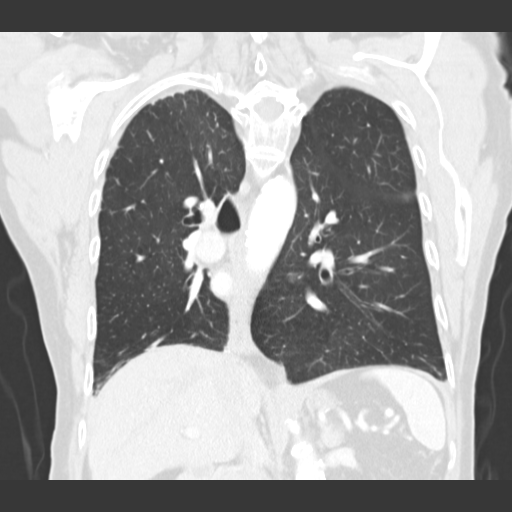

[15 of 36 positions shown; findings below may reference images not displayed]

FINDINGS: There is interval decrease in volume of the left upper lobe nodule
which measures 6 mm compared to 14 mm on prior (image 19, series 5).
branching nodularity in the inferior lingula (image 35 from series
5) is new but has an infectious pattern.

There is volume loss in the right hemi thorax. There is a small
right effusion not changed prior. There pleural calcifications.

There is no axillary lymphadenopathy. Several small paratracheal
lymph nodes are noted in the high right positioned. For example 6 mm
node on image 10, series 2 is not changed from prior. No mediastinal
adenopathy. No pericardial fluid. This for pulmonary embolism.
Esophagus is normal.

Limited view of the upper abdomen demonstrates normal adrenal
glands. No focal hepatic lesion. No aggressive osseous lesion.
IMPRESSION: 1. Interval reduction in volume of the left upper lobe pulmonary
nodule following radiation therapy.
2. New mild branching pattern in the inferior lingular appears
infectious or inflammatory. Recommend attention on routine
follow-up.
3. Stable postsurgical change in the right hemi thorax.
4. No evidence disease progression.

## 2016-03-07 DIAGNOSIS — E860 Dehydration: Secondary | ICD-10-CM | POA: Diagnosis not present

## 2016-03-07 DIAGNOSIS — M810 Age-related osteoporosis without current pathological fracture: Secondary | ICD-10-CM | POA: Diagnosis not present

## 2016-03-07 DIAGNOSIS — C3412 Malignant neoplasm of upper lobe, left bronchus or lung: Secondary | ICD-10-CM | POA: Diagnosis not present

## 2016-03-07 DIAGNOSIS — R970 Elevated carcinoembryonic antigen [CEA]: Secondary | ICD-10-CM | POA: Diagnosis not present

## 2016-03-07 DIAGNOSIS — Z951 Presence of aortocoronary bypass graft: Secondary | ICD-10-CM | POA: Diagnosis not present

## 2016-03-07 DIAGNOSIS — N189 Chronic kidney disease, unspecified: Secondary | ICD-10-CM | POA: Diagnosis not present

## 2016-03-07 DIAGNOSIS — I2510 Atherosclerotic heart disease of native coronary artery without angina pectoris: Secondary | ICD-10-CM | POA: Diagnosis not present

## 2016-03-07 DIAGNOSIS — I509 Heart failure, unspecified: Secondary | ICD-10-CM | POA: Diagnosis not present

## 2016-03-08 DIAGNOSIS — M5136 Other intervertebral disc degeneration, lumbar region: Secondary | ICD-10-CM | POA: Diagnosis not present

## 2016-03-08 DIAGNOSIS — M1288 Other specific arthropathies, not elsewhere classified, other specified site: Secondary | ICD-10-CM | POA: Diagnosis not present

## 2016-03-08 DIAGNOSIS — M545 Low back pain: Secondary | ICD-10-CM | POA: Diagnosis not present

## 2016-03-08 DIAGNOSIS — G8929 Other chronic pain: Secondary | ICD-10-CM | POA: Diagnosis not present

## 2016-03-08 DIAGNOSIS — G894 Chronic pain syndrome: Secondary | ICD-10-CM | POA: Diagnosis not present

## 2016-03-13 DIAGNOSIS — I10 Essential (primary) hypertension: Secondary | ICD-10-CM | POA: Diagnosis not present

## 2016-03-19 DIAGNOSIS — M47816 Spondylosis without myelopathy or radiculopathy, lumbar region: Secondary | ICD-10-CM | POA: Diagnosis not present

## 2016-03-19 DIAGNOSIS — M5136 Other intervertebral disc degeneration, lumbar region: Secondary | ICD-10-CM | POA: Diagnosis not present

## 2016-03-28 DIAGNOSIS — G8929 Other chronic pain: Secondary | ICD-10-CM | POA: Diagnosis not present

## 2016-03-28 DIAGNOSIS — M9983 Other biomechanical lesions of lumbar region: Secondary | ICD-10-CM | POA: Diagnosis not present

## 2016-03-28 DIAGNOSIS — M545 Low back pain: Secondary | ICD-10-CM | POA: Diagnosis not present

## 2016-03-28 DIAGNOSIS — M48061 Spinal stenosis, lumbar region without neurogenic claudication: Secondary | ICD-10-CM | POA: Diagnosis not present

## 2016-03-28 DIAGNOSIS — G894 Chronic pain syndrome: Secondary | ICD-10-CM | POA: Diagnosis not present

## 2016-03-28 DIAGNOSIS — J449 Chronic obstructive pulmonary disease, unspecified: Secondary | ICD-10-CM | POA: Diagnosis not present

## 2016-03-28 DIAGNOSIS — M4800 Spinal stenosis, site unspecified: Secondary | ICD-10-CM | POA: Diagnosis not present

## 2016-03-28 DIAGNOSIS — M5136 Other intervertebral disc degeneration, lumbar region: Secondary | ICD-10-CM | POA: Diagnosis not present

## 2016-03-28 DIAGNOSIS — I10 Essential (primary) hypertension: Secondary | ICD-10-CM | POA: Diagnosis not present

## 2016-03-28 DIAGNOSIS — F17200 Nicotine dependence, unspecified, uncomplicated: Secondary | ICD-10-CM | POA: Diagnosis not present

## 2016-04-12 DIAGNOSIS — L570 Actinic keratosis: Secondary | ICD-10-CM | POA: Diagnosis not present

## 2016-05-01 DIAGNOSIS — M109 Gout, unspecified: Secondary | ICD-10-CM | POA: Diagnosis not present

## 2016-05-18 DIAGNOSIS — H179 Unspecified corneal scar and opacity: Secondary | ICD-10-CM | POA: Diagnosis not present

## 2016-05-21 DIAGNOSIS — M25541 Pain in joints of right hand: Secondary | ICD-10-CM | POA: Diagnosis not present

## 2016-05-21 DIAGNOSIS — M2540 Effusion, unspecified joint: Secondary | ICD-10-CM | POA: Diagnosis not present

## 2016-05-28 DIAGNOSIS — M19041 Primary osteoarthritis, right hand: Secondary | ICD-10-CM | POA: Diagnosis not present

## 2016-05-28 DIAGNOSIS — M79641 Pain in right hand: Secondary | ICD-10-CM | POA: Diagnosis not present

## 2016-05-28 DIAGNOSIS — R609 Edema, unspecified: Secondary | ICD-10-CM | POA: Diagnosis not present

## 2016-05-28 DIAGNOSIS — R739 Hyperglycemia, unspecified: Secondary | ICD-10-CM | POA: Diagnosis not present

## 2016-06-05 DIAGNOSIS — M79641 Pain in right hand: Secondary | ICD-10-CM | POA: Diagnosis not present

## 2016-06-05 DIAGNOSIS — S61411A Laceration without foreign body of right hand, initial encounter: Secondary | ICD-10-CM | POA: Diagnosis not present

## 2016-06-07 DIAGNOSIS — Z85118 Personal history of other malignant neoplasm of bronchus and lung: Secondary | ICD-10-CM | POA: Diagnosis not present

## 2016-06-07 DIAGNOSIS — R970 Elevated carcinoembryonic antigen [CEA]: Secondary | ICD-10-CM | POA: Diagnosis not present

## 2016-06-07 DIAGNOSIS — M810 Age-related osteoporosis without current pathological fracture: Secondary | ICD-10-CM | POA: Diagnosis not present

## 2016-06-11 DIAGNOSIS — R918 Other nonspecific abnormal finding of lung field: Secondary | ICD-10-CM | POA: Diagnosis not present

## 2016-06-11 DIAGNOSIS — I25810 Atherosclerosis of coronary artery bypass graft(s) without angina pectoris: Secondary | ICD-10-CM | POA: Diagnosis not present

## 2016-06-11 DIAGNOSIS — R970 Elevated carcinoembryonic antigen [CEA]: Secondary | ICD-10-CM | POA: Diagnosis not present

## 2016-06-11 DIAGNOSIS — C3412 Malignant neoplasm of upper lobe, left bronchus or lung: Secondary | ICD-10-CM | POA: Diagnosis not present

## 2016-06-14 ENCOUNTER — Encounter: Payer: Self-pay | Admitting: Radiation Oncology

## 2016-06-14 DIAGNOSIS — R918 Other nonspecific abnormal finding of lung field: Secondary | ICD-10-CM | POA: Diagnosis not present

## 2016-06-14 DIAGNOSIS — C3412 Malignant neoplasm of upper lobe, left bronchus or lung: Secondary | ICD-10-CM | POA: Diagnosis not present

## 2016-06-15 DIAGNOSIS — J449 Chronic obstructive pulmonary disease, unspecified: Secondary | ICD-10-CM | POA: Diagnosis not present

## 2016-06-15 DIAGNOSIS — J984 Other disorders of lung: Secondary | ICD-10-CM | POA: Diagnosis not present

## 2016-06-15 DIAGNOSIS — Z85118 Personal history of other malignant neoplasm of bronchus and lung: Secondary | ICD-10-CM | POA: Diagnosis not present

## 2016-06-15 DIAGNOSIS — R918 Other nonspecific abnormal finding of lung field: Secondary | ICD-10-CM | POA: Insufficient documentation

## 2016-06-19 ENCOUNTER — Encounter: Payer: Self-pay | Admitting: Radiation Oncology

## 2016-06-19 ENCOUNTER — Other Ambulatory Visit (HOSPITAL_COMMUNITY)
Admission: RE | Admit: 2016-06-19 | Discharge: 2016-06-19 | Disposition: A | Discharge status: Home / Self Care | Payer: Medicare Other | Source: Ambulatory Visit | Attending: Oncology | Admitting: Oncology

## 2016-06-19 DIAGNOSIS — C3412 Malignant neoplasm of upper lobe, left bronchus or lung: Secondary | ICD-10-CM | POA: Diagnosis not present

## 2016-06-19 NOTE — Progress Notes (Signed)
//  Re consult  Squamous cell carcinoma of left  Upper Lung///  Radiation SBRT Left upper  Lobe Lung  with Dr. Pablo Ledger  : 9/1/215; 01/14/2014; 01/19/2014, 54 Gy in 3 fractions   CT  CHEST: 06/11/16/18 Impression:  /,   Seen at [facility] 06/07/16 Dr. Hosie Poisson, MD   Had Pet scan at [facility]  [facility] Radiology today=pending,  Right hand swollen, gout, c/o hurting when lowering, no coughing, appetite good,  Hx stroke  2012, Asbestos exposure  Widowed, 1 son,  BP (!) 157/71 (BP Location: Left Arm, Patient Position: Sitting, Cuff Size: Normal)   Pulse 70   Temp 97.8 F (36.6 C) (Oral)   Resp 20   Ht '5\' 8"'$  (1.727 m)   Wt 137 lb 9.6 oz (62.4 kg)   SpO2 99% Comment: room air  BMI 20.92 kg/m   Wt Readings from Last 3 Encounters:  06/21/16 137 lb 9.6 oz (62.4 kg)  12/02/15 138 lb 6.4 oz (62.8 kg)  09/02/15 135 lb 6 oz (61.4 kg)   1 Son,  Widowed,

## 2016-06-21 ENCOUNTER — Ambulatory Visit
Admission: RE | Admit: 2016-06-21 | Discharge: 2016-06-21 | Disposition: A | Discharge status: Home / Self Care | Payer: Medicare Other | Source: Ambulatory Visit | Attending: Radiation Oncology | Admitting: Radiation Oncology

## 2016-06-21 ENCOUNTER — Encounter: Payer: Self-pay | Admitting: Radiation Oncology

## 2016-06-21 DIAGNOSIS — Z923 Personal history of irradiation: Secondary | ICD-10-CM | POA: Diagnosis not present

## 2016-06-21 DIAGNOSIS — C3490 Malignant neoplasm of unspecified part of unspecified bronchus or lung: Secondary | ICD-10-CM | POA: Diagnosis not present

## 2016-06-21 DIAGNOSIS — C3412 Malignant neoplasm of upper lobe, left bronchus or lung: Secondary | ICD-10-CM | POA: Diagnosis not present

## 2016-06-21 DIAGNOSIS — C3491 Malignant neoplasm of unspecified part of right bronchus or lung: Secondary | ICD-10-CM | POA: Insufficient documentation

## 2016-06-21 DIAGNOSIS — Z87891 Personal history of nicotine dependence: Secondary | ICD-10-CM | POA: Diagnosis not present

## 2016-06-21 DIAGNOSIS — R918 Other nonspecific abnormal finding of lung field: Secondary | ICD-10-CM | POA: Diagnosis not present

## 2016-06-21 DIAGNOSIS — Z85118 Personal history of other malignant neoplasm of bronchus and lung: Secondary | ICD-10-CM | POA: Diagnosis not present

## 2016-06-21 NOTE — Progress Notes (Signed)
Department of Radiation Oncology  Phone:  [phone] Fax:        [phone]   Name: Glenn Reilly MRN: [id number]  DOB: [birth year]  Date: 06/21/2016  Re-Consult Visit Note  Diagnosis: History of Stage I NSCLC, squamous cell carcinoma of the LUL and Stage IA NSCLC of the RUL, with concerns for new disease.    ICD-9-CM ICD-10-CM   1. Stage I squamous cell carcinoma of right lung (HCC) 162.9 C34.91    Summary and Interval since last radiation: 2 years 5 months from   01/12/2014, 01/14/2014, 01/19/2014 SBRT Treatment:  54 Gy in 3 fractions to the left upper lobe  Narrative: Glenn Reilly, is a pleasant 83 y.o. gentleman with a history of a Stage IA NSCLC, large cell, of the RUL treated with prior lobectomy in 2001, and with a history of Stage IA NSCLC, squamous cell carcinoma, of the LUL S/P SBRT with Dr. Pablo Ledger in 2015. He has been followed in surveillance with Dr. Hinton Rao in [place], and presents today for re-consult. He has been followed closely for an elevated CEA between 5-7.6 in the past two years. He was found to have an elevated CEA of 8.5 ng/mL on 06/07/2016. Accordingly the patient underwent CT chest with contrast on 06/11/2016. This scan revealed findings concerning for local recurrence. Specifically, there is a "new mass-like opacity in the left upper lobe in the area of prior radiation therapy." He had a PET at [facility] regional today, and the lesion seen on CT has an SUV of 2.5 in the left hilum and a right chronic effusion that did not appear hypermetabolic. He comes to discuss the role of possible radiotherapy.  On review of systems, the patient reports that he is doing well overall. He denies any chest pain, cough, fevers, chills, night sweats, unintended weight changes. He reports chronic breathing issues with no new onset or worsening problems. He denies any bowel or bladder disturbances, and denies abdominal pain, nausea or vomiting. He denies any new skin lesions or  concerns. He reports right hand swollen, secondary to gout since 1.5 weeks before Christmas. He complains of pain in the right hand, worse overnight, and is taking Aleve. He denies drainage of the right hand. He has an appointment with a rheumatologist next week for this. He takes aspirin daily. Appetite is good. He exercises daily with pulmonary rehab at [facility]. He is being treated for congestive heart failure, with open heart surgery in 2015. He is taking Lasix. History of stroke in 2012. Asbestos exposure. A complete review of systems is obtained and is otherwise negative.  PET imaging from 06/21/16:    Dosimetry records from prior treatment  Past Medical History:  Past Medical History:  Diagnosis Date  . Asbestosis ([place])   . Asthma   . CAD, [place] PCI 2004 Cath 11/05/2013 with 50% LMain, 75% prox LAD, 80% prox LCx, 50% RCA with normal EF    . Carotid artery disease ([place])   . Cataract   . Complication of anesthesia    lt side throat parlysis  . COPD (chronic obstructive pulmonary disease) ([place])   . COPD with emphysema ([place]) 01/13/2010   Spirometry 10/19/2013 is reviewed. This reveals FEV1 44% predicted FVC 47% predicted and FEF 25 7537% predicted   . Detached retina   . Dysphagia as late effect of stroke    Developed as complication of stroke following left carotid endarterectomy with documented left hypoglossal nerve paralysis   .  Factor 5 Leiden mutation, heterozygous ([place])   . 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 HTN (hypertension)   . Hyperlipemia   . HYPERLIPIDEMIA-MIXED 01/13/2010   Qualifier: Diagnosis of  By: Olevia Perches, MD, Glenetta Hew   . HYPERTENSION, BENIGN 01/13/2010   Qualifier: Diagnosis of  By: Olevia Perches, MD, Glenetta Hew   . Hypothyroidism   . Lung cancer ([place]) 2001   lower right lobe  surgery to remove cancer, no chemo or radiation  . Lung mass 10/19/2013   CT of the chest 10/12/2013 reveals an enlarging left upper lobe irregular nodule. Now 1.4 compared to 1.0 cm in diameter when compared to March 2015 CT scan. There is calcified pleural plaques seen bilaterally. There is subpleural scarring seen as well. Previous right lower lobectomy has been demonstrated.  PET-CT scan from June 2015 is reviewed this shows increased uptake (SUV > 8) in left upper   . PVD 01/13/2010   Qualifier: Diagnosis of  By: Olevia Perches, MD, Glenetta Hew   . PVD (peripheral vascular disease) ([place]) 2011   carotid endarterectomy, pv stents  . Radiation 9/1, 9/3, 01/19/14   Left upper lung 3 fractions  . S/P CABG x 2 11/20/2013   Off-pump CABG x2 using LIMA to LAD, SVG to OM1, EVH via right thigh  . Stroke [facility]) 07/2009   left carotid endarterectomy    Past Surgical History: Past Surgical History:  Procedure Laterality Date  . APPENDECTOMY    . BIOPSY Left 11/20/2013   Procedure: BIOPSY Left Upper Lobe;  Surgeon: Rexene Alberts, MD;  Location: [place];  Service: Open Heart Surgery;  Laterality: Left;  . CAROTID ENDARTERECTOMY Left 2011   [facility] - complicated by post-op stroke  . CORONARY ARTERY BYPASS GRAFT N/A 11/20/2013   Procedure: OFF PUMP CORONARY ARTERY BYPASS GRAFTING (CABG) x2: LIMA to LAD, SVG to Obtuse Marginal 1.;  Surgeon: Rexene Alberts, MD;  Location: [place];  Service: Open Heart Surgery;  Laterality: N/A;  . FRACTURE SURGERY Left    foot  . HEMORRHOID SURGERY    . ILIAC ARTERY STENT Bilateral 2011   [facility] - performed for asymptomatic iliac artery disease  . INTRAOPERATIVE TRANSESOPHAGEAL ECHOCARDIOGRAM N/A 11/20/2013   Procedure: INTRAOPERATIVE TRANSESOPHAGEAL ECHOCARDIOGRAM;  Surgeon: Rexene Alberts, MD;  Location: [place];  Service: Open Heart Surgery;  Laterality: N/A;  . LEFT HEART CATHETERIZATION WITH CORONARY ANGIOGRAM N/A 11/05/2013   Procedure: LEFT HEART  CATHETERIZATION WITH CORONARY ANGIOGRAM;  Surgeon: Lorretta Harp, MD;  Location: [facility] CATH LAB;  Service: Cardiovascular;  Laterality: N/A;  . 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 PERCUTANEOUS CORONARY STENT INTERVENTION (PCI-S)  1999   RCA  . PROSTATE SURGERY     TURP  . VIDEO ASSISTED THORACOSCOPY (VATS)/EMPYEMA Right 07/13/1999   drainage of post-operative empyema following RLLobectomy    Social History:  Social History   Social History  . Marital status: Widowed    Spouse name: N/A  . Number of children: 1  . Years of education: N/A   Occupational History  . 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 Smoking status: Former Smoker    Packs/day: 1.00  Years: 30.00    Types: Cigarettes    Quit date: 03/03/1979  . Smokeless tobacco: Current User    Types: Chew  . Alcohol use No  . Drug use: No  . Sexual activity: Not on file   Other Topics Concern  . Not on file   Social History Narrative  . No narrative on file    Family History: Family History  Problem Relation Age of Onset  . Heart attack Mother   . Heart attack Father   . Cancer Brother     esophagus and lung  . Cancer Brother     lung   Medications: Current Outpatient Prescriptions  Medication Sig Dispense Refill  . albuterol (PROVENTIL HFA;VENTOLIN HFA) 108 (90 BASE) MCG/ACT inhaler Inhale 2 puffs into the lungs every 6 (six) hours as needed for wheezing or shortness of breath.    Marland Kitchen aspirin 81 MG tablet Take 81 mg by mouth daily.    Marland Kitchen FLOVENT HFA 110 MCG/ACT inhaler INL 1 PUFF PO BID  11  . furosemide (LASIX) 40 MG tablet TAKE 1 TABLET(40 MG) BY MOUTH DAILY 90 tablet 2  . ipratropium (ATROVENT) 0.03 % nasal spray Place 1 spray into both nostrils 2 (two) times daily.   12  . ipratropium-albuterol (DUONEB) 0.5-2.5 (3) MG/3ML SOLN Take 3 mLs by nebulization 3 (three) times  daily.    Marland Kitchen levothyroxine (SYNTHROID, LEVOTHROID) 75 MCG tablet Take 75 mcg by mouth daily before breakfast.    . lisinopril (PRINIVIL,ZESTRIL) 40 MG tablet 40 mg once.  11  . predniSONE (DELTASONE) 10 MG tablet Take 10 mg by mouth daily with breakfast.    . Red Yeast Rice 600 MG CAPS Take 1 capsule by mouth 2 (two) times daily.     No current facility-administered medications for this encounter.     Allergies:  Allergies  Allergen Reactions  . Demerol [Meperidine] Other (See Comments)    Other reaction(s): Shock (ALLERGY) shock  . Statins Other (See Comments)      Physical Exam:  Wt Readings from Last 3 Encounters:  06/21/16 137 lb 9.6 oz (62.4 kg)  12/02/15 138 lb 6.4 oz (62.8 kg)  09/02/15 135 lb 6 oz (61.4 kg)   Temp Readings from Last 3 Encounters:  06/21/16 97.8 F (36.6 C) (Oral)  05/24/14 97.6 F (36.4 C) (Oral)  02/19/14 97.9 F (36.6 C)   BP Readings from Last 3 Encounters:  06/21/16 (!) 157/71  12/02/15 110/60  09/02/15 (!) 160/86   Pulse Readings from Last 3 Encounters:  06/21/16 70  12/02/15 60  09/02/15 76     In general this is an elderly, though well appearing Caucasian male in no acute distress. He is alert and oriented x4 and appropriate throughout the examination. HEENT reveals that the patient is normocephalic, atraumatic. EOMs are intact. PERRLA. Skin is intact without any evidence of gross lesions. Cardiovascular exam reveals a regular rate and rhythm, no clicks rubs or murmurs are auscultated. Chest is clear to auscultation bilaterally. Lymphatic assessment is performed and does not reveal any adenopathy in the cervical, supraclavicular, axillary, or inguinal chains. Abdomen has active bowel sounds in all quadrants and is intact. The abdomen is soft, non tender, non distended. Lower extremities are negative for pretibial pitting edema, deep calf tenderness, cyanosis or clubbing. Right hand reveals bruising and edema as well as some     IMPRESSION/PLAN: 1.  Stage IA, NSCLC, squamous cell carcinoma of the left upper lobe.The patient's PET scan was able to  be reviewed prior to his discussion with Dr. Lisbeth Renshaw, and it appears that the findings were consistent with more of an inflammatory process, however it does not appear to be frankly malignant due to the low SUV. Dr. Lisbeth Renshaw would recommend that the patient have a repeat CT scan in about 4 months. We will set this up at [facility], and see him back to review the results.  2.  Stage IA, NSCLC, large cell carcinoma of the right upper lobe. The patient continues to be radiographically NED. We will continue to follow this expectantly. 3. Possible Gout. The patient will follow up in the rheumatology clinic close to home for further evaluation of this.   The above documentation reflects my direct findings during this shared patient visit. Please see the separate note by Dr. Lisbeth Renshaw on this date for the remainder of the patient's plan of care.     Carola Rhine, PAC  This document serves as a record of services personally performed by Kyung Rudd, MD and Shona Simpson, PA-C. It was created on their behalf by Arlyce Harman, a trained medical scribe. The creation of this record is based on the scribe's personal observations and the provider's statements to them. This document has been checked and approved by the attending provider.

## 2016-06-22 ENCOUNTER — Telehealth: Payer: Self-pay | Admitting: *Deleted

## 2016-06-22 NOTE — Telephone Encounter (Signed)
On 06-22-16 gave a medical records request to [facility]

## 2016-06-22 NOTE — Telephone Encounter (Signed)
Called patient to inform of CT on 10-22-16 @ [facility]- arrival time - 10 am for ([facility]) and his Ct to follow @ 11 am, pt. To be NPO 4 HRS. Prior to test, spoke with patient and he is aware of these appts.

## 2016-06-26 ENCOUNTER — Encounter (HOSPITAL_COMMUNITY): Payer: Self-pay

## 2016-06-26 ENCOUNTER — Telehealth: Payer: Self-pay | Admitting: *Deleted

## 2016-06-26 NOTE — Telephone Encounter (Signed)
On 06-26-16 pt coming by to pick up records

## 2016-06-27 DIAGNOSIS — R5382 Chronic fatigue, unspecified: Secondary | ICD-10-CM | POA: Diagnosis not present

## 2016-06-27 DIAGNOSIS — M2550 Pain in unspecified joint: Secondary | ICD-10-CM | POA: Diagnosis not present

## 2016-06-27 DIAGNOSIS — Z6821 Body mass index (BMI) 21.0-21.9, adult: Secondary | ICD-10-CM | POA: Diagnosis not present

## 2016-06-27 DIAGNOSIS — M7989 Other specified soft tissue disorders: Secondary | ICD-10-CM | POA: Diagnosis not present

## 2016-07-04 DIAGNOSIS — Z6821 Body mass index (BMI) 21.0-21.9, adult: Secondary | ICD-10-CM | POA: Diagnosis not present

## 2016-07-04 DIAGNOSIS — M7989 Other specified soft tissue disorders: Secondary | ICD-10-CM | POA: Diagnosis not present

## 2016-07-04 DIAGNOSIS — M2550 Pain in unspecified joint: Secondary | ICD-10-CM | POA: Diagnosis not present

## 2016-07-04 DIAGNOSIS — N183 Chronic kidney disease, stage 3 (moderate): Secondary | ICD-10-CM | POA: Diagnosis not present

## 2016-07-04 DIAGNOSIS — R5382 Chronic fatigue, unspecified: Secondary | ICD-10-CM | POA: Diagnosis not present

## 2016-07-04 DIAGNOSIS — E790 Hyperuricemia without signs of inflammatory arthritis and tophaceous disease: Secondary | ICD-10-CM | POA: Diagnosis not present

## 2016-07-06 DIAGNOSIS — R918 Other nonspecific abnormal finding of lung field: Secondary | ICD-10-CM | POA: Diagnosis not present

## 2016-07-06 DIAGNOSIS — J984 Other disorders of lung: Secondary | ICD-10-CM | POA: Diagnosis not present

## 2016-07-06 DIAGNOSIS — J310 Chronic rhinitis: Secondary | ICD-10-CM | POA: Diagnosis not present

## 2016-07-06 DIAGNOSIS — J449 Chronic obstructive pulmonary disease, unspecified: Secondary | ICD-10-CM | POA: Diagnosis not present

## 2016-07-06 DIAGNOSIS — M109 Gout, unspecified: Secondary | ICD-10-CM | POA: Diagnosis not present

## 2016-07-06 DIAGNOSIS — Z85118 Personal history of other malignant neoplasm of bronchus and lung: Secondary | ICD-10-CM | POA: Diagnosis not present

## 2016-07-10 DIAGNOSIS — L570 Actinic keratosis: Secondary | ICD-10-CM | POA: Diagnosis not present

## 2016-07-20 DIAGNOSIS — M109 Gout, unspecified: Secondary | ICD-10-CM | POA: Diagnosis not present

## 2016-08-01 DIAGNOSIS — I10 Essential (primary) hypertension: Secondary | ICD-10-CM | POA: Diagnosis not present

## 2016-08-01 DIAGNOSIS — C3492 Malignant neoplasm of unspecified part of left bronchus or lung: Secondary | ICD-10-CM | POA: Diagnosis not present

## 2016-08-01 DIAGNOSIS — R062 Wheezing: Secondary | ICD-10-CM | POA: Diagnosis not present

## 2016-08-01 DIAGNOSIS — J441 Chronic obstructive pulmonary disease with (acute) exacerbation: Secondary | ICD-10-CM | POA: Diagnosis not present

## 2016-08-03 DIAGNOSIS — R062 Wheezing: Secondary | ICD-10-CM | POA: Diagnosis not present

## 2016-08-03 DIAGNOSIS — R05 Cough: Secondary | ICD-10-CM | POA: Diagnosis not present

## 2016-08-03 DIAGNOSIS — J441 Chronic obstructive pulmonary disease with (acute) exacerbation: Secondary | ICD-10-CM | POA: Diagnosis not present

## 2016-08-05 DIAGNOSIS — Z79899 Other long term (current) drug therapy: Secondary | ICD-10-CM | POA: Diagnosis not present

## 2016-08-05 DIAGNOSIS — I2510 Atherosclerotic heart disease of native coronary artery without angina pectoris: Secondary | ICD-10-CM | POA: Diagnosis not present

## 2016-08-05 DIAGNOSIS — I10 Essential (primary) hypertension: Secondary | ICD-10-CM

## 2016-08-05 DIAGNOSIS — Z862 Personal history of diseases of the blood and blood-forming organs and certain disorders involving the immune mechanism: Secondary | ICD-10-CM | POA: Diagnosis not present

## 2016-08-05 DIAGNOSIS — R9431 Abnormal electrocardiogram [ECG] [EKG]: Secondary | ICD-10-CM | POA: Diagnosis not present

## 2016-08-05 DIAGNOSIS — R0902 Hypoxemia: Secondary | ICD-10-CM | POA: Diagnosis not present

## 2016-08-05 DIAGNOSIS — Z8679 Personal history of other diseases of the circulatory system: Secondary | ICD-10-CM | POA: Diagnosis not present

## 2016-08-05 DIAGNOSIS — Z7982 Long term (current) use of aspirin: Secondary | ICD-10-CM | POA: Diagnosis not present

## 2016-08-05 DIAGNOSIS — I739 Peripheral vascular disease, unspecified: Secondary | ICD-10-CM | POA: Diagnosis not present

## 2016-08-05 DIAGNOSIS — Z955 Presence of coronary angioplasty implant and graft: Secondary | ICD-10-CM | POA: Diagnosis not present

## 2016-08-05 DIAGNOSIS — R748 Abnormal levels of other serum enzymes: Secondary | ICD-10-CM | POA: Diagnosis present

## 2016-08-05 DIAGNOSIS — Z8739 Personal history of other diseases of the musculoskeletal system and connective tissue: Secondary | ICD-10-CM | POA: Diagnosis not present

## 2016-08-05 DIAGNOSIS — I5030 Unspecified diastolic (congestive) heart failure: Secondary | ICD-10-CM | POA: Diagnosis not present

## 2016-08-05 DIAGNOSIS — J9621 Acute and chronic respiratory failure with hypoxia: Secondary | ICD-10-CM | POA: Diagnosis not present

## 2016-08-05 DIAGNOSIS — Z87891 Personal history of nicotine dependence: Secondary | ICD-10-CM | POA: Diagnosis not present

## 2016-08-05 DIAGNOSIS — I110 Hypertensive heart disease with heart failure: Secondary | ICD-10-CM | POA: Diagnosis not present

## 2016-08-05 DIAGNOSIS — J441 Chronic obstructive pulmonary disease with (acute) exacerbation: Secondary | ICD-10-CM | POA: Diagnosis not present

## 2016-08-05 DIAGNOSIS — R0602 Shortness of breath: Secondary | ICD-10-CM | POA: Diagnosis not present

## 2016-08-05 DIAGNOSIS — Z9889 Other specified postprocedural states: Secondary | ICD-10-CM | POA: Diagnosis not present

## 2016-08-05 DIAGNOSIS — E039 Hypothyroidism, unspecified: Secondary | ICD-10-CM | POA: Diagnosis present

## 2016-08-05 DIAGNOSIS — Z951 Presence of aortocoronary bypass graft: Secondary | ICD-10-CM | POA: Diagnosis not present

## 2016-08-05 DIAGNOSIS — Z8673 Personal history of transient ischemic attack (TIA), and cerebral infarction without residual deficits: Secondary | ICD-10-CM | POA: Diagnosis not present

## 2016-08-05 DIAGNOSIS — Z85118 Personal history of other malignant neoplasm of bronchus and lung: Secondary | ICD-10-CM | POA: Diagnosis not present

## 2016-08-05 DIAGNOSIS — R7989 Other specified abnormal findings of blood chemistry: Secondary | ICD-10-CM | POA: Diagnosis present

## 2016-08-05 DIAGNOSIS — R778 Other specified abnormalities of plasma proteins: Secondary | ICD-10-CM | POA: Diagnosis not present

## 2016-08-06 ENCOUNTER — Encounter: Payer: Self-pay | Admitting: Cardiovascular Disease

## 2016-08-06 DIAGNOSIS — R9431 Abnormal electrocardiogram [ECG] [EKG]: Secondary | ICD-10-CM | POA: Diagnosis not present

## 2016-08-07 ENCOUNTER — Telehealth: Payer: Self-pay | Admitting: Cardiovascular Disease

## 2016-08-07 NOTE — Telephone Encounter (Signed)
New message    Pt daughter in law is calling because pt is complaining of arm pain when he gets up to move. Pt daughter in law says he just left the hospital for a respiratory issue.  Pt c/o Shortness Of Breath: STAT if SOB developed within the last 24 hours or pt is noticeably SOB on the phone  1. Are you currently SOB (can you hear that pt is SOB on the phone)? No, he's sitting down.  2. How long have you been experiencing SOB? Off and on for a week.   3. Are you SOB when sitting or when up moving around? Up moving around  4. Are you currently experiencing any other symptoms? Left arm pain.

## 2016-08-07 NOTE — Telephone Encounter (Signed)
Spoke with Carmin Muskrat in law(states that she is an Therapist, sports at [employer]) she states that pt was discharged at noon today from [place]. She states that pt was having exertional SOB, and chest pain and l arm pain(denies nausea,vomiting or diaphoresis or any other cardiac sx per daughter-in-law, RN) she states that CP and L arm pain stops with a few minutes of resting. She states that she will [facility] and have them fax over(our fax # given) discharge summary,any lab or ECG from this hospitalization. She will bring current medication list and pt's d/c summary with them to appt. Appt scheduled with Eulas Post 08-15-16 (1st available) at [place] [facility]/daughter in law(RN) informed to go to the ER if symptoms persist, worsen, or any new cardiac symptoms develop, verbalizes understanding

## 2016-08-08 ENCOUNTER — Telehealth: Payer: Self-pay | Admitting: Physician Assistant

## 2016-08-08 DIAGNOSIS — J40 Bronchitis, not specified as acute or chronic: Secondary | ICD-10-CM | POA: Diagnosis not present

## 2016-08-08 DIAGNOSIS — J4540 Moderate persistent asthma, uncomplicated: Secondary | ICD-10-CM | POA: Diagnosis not present

## 2016-08-08 NOTE — Telephone Encounter (Signed)
Received records from [facility] for appointment on 08/15/16 with Almyra Deforest, PA.  Records put with Hao's schedule for 08/15/16. lp

## 2016-08-08 NOTE — Telephone Encounter (Signed)
I did review record from [facility], it appears he presented with SOB, treated for COPD exacerbation, had elevated BNP and mildly elevated troponin. Echo shows normal EF and mild valve issue. This does explain the SOB, but does not explain the chest pain. He may require additional workup, however will decide on followup. Agree with seeking more urgent medical attention if symptom worsen.

## 2016-08-12 DIAGNOSIS — K922 Gastrointestinal hemorrhage, unspecified: Secondary | ICD-10-CM

## 2016-08-12 HISTORY — DX: Gastrointestinal hemorrhage, unspecified: K92.2

## 2016-08-15 ENCOUNTER — Ambulatory Visit (INDEPENDENT_AMBULATORY_CARE_PROVIDER_SITE_OTHER): Payer: Medicare Other | Admitting: Physician Assistant

## 2016-08-15 ENCOUNTER — Telehealth: Payer: Self-pay | Admitting: Cardiovascular Disease

## 2016-08-15 ENCOUNTER — Telehealth: Payer: Self-pay | Admitting: Physician Assistant

## 2016-08-15 ENCOUNTER — Encounter: Payer: Self-pay | Admitting: Physician Assistant

## 2016-08-15 ENCOUNTER — Other Ambulatory Visit: Payer: Self-pay | Admitting: Physician Assistant

## 2016-08-15 VITALS — BP 144/72 | HR 70 | Ht 68.0 in | Wt 132.0 lb

## 2016-08-15 DIAGNOSIS — C3431 Malignant neoplasm of lower lobe, right bronchus or lung: Secondary | ICD-10-CM | POA: Diagnosis not present

## 2016-08-15 DIAGNOSIS — D689 Coagulation defect, unspecified: Secondary | ICD-10-CM | POA: Diagnosis not present

## 2016-08-15 DIAGNOSIS — I200 Unstable angina: Secondary | ICD-10-CM | POA: Diagnosis not present

## 2016-08-15 DIAGNOSIS — I25810 Atherosclerosis of coronary artery bypass graft(s) without angina pectoris: Secondary | ICD-10-CM | POA: Diagnosis not present

## 2016-08-15 DIAGNOSIS — I739 Peripheral vascular disease, unspecified: Secondary | ICD-10-CM

## 2016-08-15 DIAGNOSIS — I6523 Occlusion and stenosis of bilateral carotid arteries: Secondary | ICD-10-CM

## 2016-08-15 DIAGNOSIS — J449 Chronic obstructive pulmonary disease, unspecified: Secondary | ICD-10-CM

## 2016-08-15 LAB — CBC
HEMATOCRIT: 39.3 % (ref 38.5–50.0)
Hemoglobin: 12.8 g/dL — ABNORMAL LOW (ref 13.2–17.1)
MCH: 29.4 pg (ref 27.0–33.0)
MCHC: 32.6 g/dL (ref 32.0–36.0)
MCV: 90.3 fL (ref 80.0–100.0)
MPV: 9.2 fL (ref 7.5–12.5)
Platelets: 211 10*3/uL (ref 140–400)
RBC: 4.35 MIL/uL (ref 4.20–5.80)
RDW: 15.8 % — AB (ref 11.0–15.0)
WBC: 11.7 10*3/uL — ABNORMAL HIGH (ref 3.8–10.8)

## 2016-08-15 LAB — BASIC METABOLIC PANEL
BUN: 41 mg/dL — AB (ref 7–25)
CALCIUM: 10 mg/dL (ref 8.6–10.3)
CHLORIDE: 106 mmol/L (ref 98–110)
CO2: 25 mmol/L (ref 20–31)
CREATININE: 1.64 mg/dL — AB (ref 0.70–1.11)
Glucose, Bld: 87 mg/dL (ref 65–99)
Potassium: 4.3 mmol/L (ref 3.5–5.3)
Sodium: 142 mmol/L (ref 135–146)

## 2016-08-15 MED ORDER — AMLODIPINE BESYLATE 5 MG PO TABS: 5.0000 mg | ORAL_TABLET | Freq: Every day | ORAL | 3 refills | Status: AC

## 2016-08-15 MED ORDER — FUROSEMIDE 20 MG PO TABS: 20.0000 mg | ORAL_TABLET | Freq: Every day | ORAL | Status: DC

## 2016-08-15 MED ORDER — AMLODIPINE BESYLATE 5 MG PO TABS: 5.0000 mg | ORAL_TABLET | Freq: Every day | ORAL | 3 refills | Status: DC

## 2016-08-15 MED ORDER — LISINOPRIL 20 MG PO TABS: 20.0000 mg | ORAL_TABLET | Freq: Every day | ORAL | 3 refills | Status: DC

## 2016-08-15 MED ORDER — FUROSEMIDE 40 MG PO TABS
40.0000 mg | ORAL_TABLET | Freq: Every day | ORAL | Status: DC
Start: 2016-08-15 — End: 2016-08-25

## 2016-08-15 NOTE — Telephone Encounter (Signed)
New message    *STAT* If patient is at the pharmacy, call can be transferred to refill team.   1. Which medications need to be refilled? (please list name of each medication and dose if known) amLODipine (NORVASC) 5 MG tablet  lisinopril (PRINIVIL,ZESTRIL) 20 MG tablet 2. Which pharmacy/location (including street and city if local pharmacy) is medication to be sent to? [id number] [place], [place] [address]. Do they need a 30 day or 90 day supply? 30 day supply for both

## 2016-08-15 NOTE — Telephone Encounter (Signed)
Already sent in-at time of appt. Need to check at pharmacy

## 2016-08-15 NOTE — Patient Instructions (Addendum)
Medication Instructions:  DECREASE LISINOPRIL TO 20 MG DAILY START AMLODIPINE 5 MG DAILY  If you need a refill on your cardiac medications before your next appointment, please call your pharmacy.  Labwork: TODAY AT [facility] ON THE 1ST FLOOR:CBC, BMET, PT/INR  Testing/Procedures: Your physician has requested that you have a cardiac catheterization. Cardiac catheterization is used to diagnose and/or treat various heart conditions. Doctors may recommend this procedure for a number of different reasons. The most common reason is to evaluate chest pain. Chest pain can be a symptom of coronary artery disease (CAD), and cardiac catheterization can show whether plaque is narrowing or blocking your heart's arteries. This procedure is also used to evaluate the valves, as well as measure the blood flow and oxygen levels in different parts of your heart. For further information please visit [email or web address]. Please follow instruction sheet, as given.  Follow-Up: Your physician wants you to follow-up in: AFTER CATH PROCEDURE WITH DR Gwenlyn Found  Special Instructions:     Thank you for choosing [facility] at [facility]!!    [facility], PA-C [place], [place] [address] [place] [address] Dept: [phone] Loc: [place]  08/15/2016  You are scheduled for a Cardiac Catheterization on Monday, April 9 @ 530 AM with Dr. Burt Knack.  1. Please arrive at the [facility] (Main Entrance A) at [facility]: [place], [place] [address] at 530 am (two hours before your procedure to ensure your preparation). Free valet parking service is available.   Special note: Every effort is made to have your procedure done on time. Please understand that emergencies sometimes delay scheduled procedures.  2. Diet: Do not eat or drink anything after midnight  prior to your procedure except sips of water to take medications.  3. Labs: You will need to have blood drawn on Monday, April 4 at [facility] [address], [place]  Open: [place] (Lunch 12:30 - 1:30)   Phone: [phone]. You do not need to be fasting.  4. Medication instructions in preparation for your procedure:   Current Outpatient Prescriptions (Endocrine & Metabolic):  .  levothyroxine (SYNTHROID, LEVOTHROID) 75 MCG tablet, Take 75 mcg by mouth daily before breakfast. .  predniSONE (DELTASONE) 10 MG tablet, Take 10 mg by mouth daily with breakfast.  Current Outpatient Prescriptions (Cardiovascular):  .  furosemide (LASIX) 40 MG tablet, Take 1 tablet (40 mg total) by mouth daily. Marland Kitchen  lisinopril (PRINIVIL,ZESTRIL) 40 MG tablet, 40 mg once. Marland Kitchen  amLODipine (NORVASC) 5 MG tablet, Take 1 tablet (5 mg total) by mouth daily.  Current Outpatient Prescriptions (Respiratory):  .  albuterol (PROVENTIL HFA;VENTOLIN HFA) 108 (90 BASE) MCG/ACT inhaler, Inhale 2 puffs into the lungs every 6 (six) hours as needed for wheezing or shortness of breath. Cristy Friedlander HFA 110 MCG/ACT inhaler, INL 1 PUFF PO BID .  ipratropium (ATROVENT) 0.03 % nasal spray, Place 1 spray into both nostrils 2 (two) times daily.  Marland Kitchen  ipratropium-albuterol (DUONEB) 0.5-2.5 (3) MG/3ML SOLN, Take 3 mLs by nebulization 4 (four) times daily.  .  mometasone-formoterol (DULERA) 200-5 MCG/ACT AERO, Inhale 2 puffs into the lungs 2 (two) times daily. Marland Kitchen  MUCINEX 600 MG 12 hr tablet, Take 600 mg by mouth 2 (two) times daily.  Current Outpatient Prescriptions (Analgesics):  .  aspirin 81 MG tablet, Take 81 mg by  mouth daily.   *For reference purposes while preparing patient instructions.   Delete this med list prior to printing instructions for patient.*  Stop taking on Sunday, April 8.-HOLD LASIX (DAY BEFORE AND DAY OF PROCEDURE)     On the morning of your procedure, take your all your morning medicines NOT listed  above.  You may use sips of water.  5. Plan for one night stay--bring personal belongings. 6. Bring a current list of your medications and current insurance cards. 7. You MUST have a responsible person to drive you home. 8. Someone MUST be with you the first 24 hours after you arrive home or your discharge will be delayed. 9. Please wear clothes that are easy to get on and off and wear slip-on shoes.  Thank you for allowing [place] to care for you!   -- [facility]

## 2016-08-15 NOTE — Progress Notes (Signed)
Cardiology Office Note    Date:  08/15/2016   ID:  Glenn Reilly, DOB 01-30-1929, MRN [id number]  PCP:  Townsend Roger, MD  Cardiologist:  Dr. Gwenlyn Found  Chief Complaint  Patient presents with  . Follow-up    seen for Dr. Gwenlyn Found, having left arm pain, no chest pain, has copd, has dizziness after starting lisinopril rx from [place], never had sx before when taking med from regular pharmacy    History of Present Illness:  Glenn Reilly is a 83 y.o. male with PMH of CAD s/p CABG 11/20/2013 LIMA to LAD and SVG to circumflex, PAD s/p bilateral iliac stenting remotely, carotid artery disease, hypertension, hyperlipidemia, h/o lung CA s/p R lower lobectomy and COPD. He initially underwent cardiac catheterization on 11/05/2013 which showed 50% left main, 70% proximal LAD, 75-80% left circumflex lesion. CT surgery was recommended, and he subsequently underwent the off-pump two-vessel CABG with LIMA to the distal LAD, SVG to OM1 by Dr. Roxy Manns on 11/20/2013. The patient was evaluated by Dr. Gwenlyn Found in September 2016 for increasing dyspnea on exertion, lower extremity edema and upper extremity discomfort. His Lasix was increased. Echocardiogram obtained on 01/26/2015 showed EF 83-41%, grade 1 diastolic dysfunction, peak PA pressure 19 mmHg. Myoview obtained on 01/25/2015 showed normal perfusion, EF 55-65%, low risk study. Lower extremity venous ultrasound was negative for blood clot. Carotid ultrasound performed in the same month showed moderate right ICA stenosis 40-59%, recommend 12 month follow-up.  I last saw the patient in March 2017, he was complaining some occasional chest pressure. Repeat Myoview obtained on 08/09/2015 showed EF 61%, no obvious ischemia, overall low risk study. He had a negative CT angiogram of the chest on 08/20/2015 at [facility]. He had a repeat carotid Doppler on 01/26/2016 which showed moderate bilateral carotid artery stenosis, one year repeat image was recommended. More recently, it  appears he was seen at [facility] again, CT of the chest obtained in January was concerning for recurrent lung cancer.  Patient presented today for left arm pain evaluation accompanied by his son and daughter-in-law. According to the patient, he never had chest pain even prior to the previous bypass surgery, and has always been bilateral arm pain associated with exertion. More recently in the last few weeks, he has been having left arm pain with exertion and relieved by rest. Surprisingly, he does not have right arm pain. I rotated his arm during physical exam, rotation of the joint does not cause any pain. He says this is very much reminiscent of the previous angina. He has been consistently noting exertional chest discomfort every time he woke up a small incline or even take a shower. Per family, apparently taken a shower strenuous for him. He is still recovering from the recent pneumonia and COPD exacerbation, he is currently on the maintenance dose of prednisone 10 mg daily I have reviewed the outside records from [facility], his recent EKG has not changed. Furthermore he mentions his current dose of lisinopril from [place] may be a little bit too much for him as sometimes his systolic blood pressure dipped down to the 90s. I will decrease his lisinopril to 20 mg daily while adding amlodipine 5 mg daily for antianginal purposes as his symptom is consistent with progressive class III angina. I have also discussed with DOD Dr. Martinique who recommended cardiac catheterization to further assess the coronary anatomy.  I did ask the patient about his recent diagnosis of possible recurrent lung cancer, according  to the daughter-in-law, he was told by his oncologist that he would not be a good candidate for chemotherapy. He was referred to our radiation oncologist who recommended repeat image in June before proceeding with further recommendation. He does not expect any biopsy prior to that.   Past  Medical History:  Diagnosis Date  . Asbestosis ([place])   . Asthma   . CAD, [place] PCI 2004 Cath 11/05/2013 with 50% LMain, 75% prox LAD, 80% prox LCx, 50% RCA with normal EF    . Carotid artery disease ([place])   . Cataract   . Complication of anesthesia    lt side throat parlysis  . COPD (chronic obstructive pulmonary disease) ([place])   . COPD with emphysema ([place]) 01/13/2010   Spirometry 10/19/2013 is reviewed. This reveals FEV1 44% predicted FVC 47% predicted and FEF 25 7537% predicted   . Detached retina   . Dysphagia as late effect of stroke    Developed as complication of stroke following left carotid endarterectomy with documented left hypoglossal nerve paralysis   . Factor 5 Leiden mutation, heterozygous ([place])   . 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 HTN (hypertension)   . Hyperlipemia   . HYPERLIPIDEMIA-MIXED 01/13/2010   Qualifier: Diagnosis of  By: Olevia Perches, MD, Glenetta Hew   . HYPERTENSION, BENIGN 01/13/2010   Qualifier: Diagnosis of  By: Olevia Perches, MD, Glenetta Hew   . Hypothyroidism   . Lung cancer ([place]) 2001   lower right lobe surgery to remove cancer, no chemo or radiation  . Lung mass 10/19/2013   CT of the chest 10/12/2013 reveals an enlarging left upper lobe irregular nodule. Now 1.4 compared to 1.0 cm in diameter when compared to March 2015 CT scan. There is calcified pleural plaques seen bilaterally. There is subpleural scarring seen as well. Previous right lower lobectomy has been demonstrated.  PET-CT scan from June 2015 is reviewed this shows increased uptake (SUV > 8) in left upper   . PVD 01/13/2010   Qualifier: Diagnosis of  By: Olevia Perches, MD, Glenetta Hew   . PVD (peripheral vascular disease) ([place]) 2011   carotid endarterectomy, pv stents  . Radiation 9/1, 9/3, 01/19/14   Left upper lung  3 fractions  . S/P CABG x 2 11/20/2013   Off-pump CABG x2 using LIMA to LAD, SVG to OM1, EVH via right thigh  . Stroke [facility]) 07/2009   left carotid endarterectomy    Past Surgical History:  Procedure Laterality Date  . APPENDECTOMY    . BIOPSY Left 11/20/2013   Procedure: BIOPSY Left Upper Lobe;  Surgeon: Rexene Alberts, MD;  Location: [place];  Service: Open Heart Surgery;  Laterality: Left;  . CAROTID ENDARTERECTOMY Left 2011   [facility] - complicated by post-op stroke  . CORONARY ARTERY BYPASS GRAFT N/A 11/20/2013   Procedure: OFF PUMP CORONARY ARTERY BYPASS GRAFTING (CABG) x2: LIMA to LAD, SVG to Obtuse Marginal 1.;  Surgeon: Rexene Alberts, MD;  Location: [place];  Service: Open Heart Surgery;  Laterality: N/A;  . FRACTURE SURGERY Left    foot  . HEMORRHOID SURGERY    . ILIAC ARTERY STENT Bilateral 2011   [facility] - performed for asymptomatic iliac artery disease  . INTRAOPERATIVE TRANSESOPHAGEAL ECHOCARDIOGRAM N/A 11/20/2013   Procedure:  INTRAOPERATIVE TRANSESOPHAGEAL ECHOCARDIOGRAM;  Surgeon: Rexene Alberts, MD;  Location: [place];  Service: Open Heart Surgery;  Laterality: N/A;  . LEFT HEART CATHETERIZATION WITH CORONARY ANGIOGRAM N/A 11/05/2013   Procedure: LEFT HEART CATHETERIZATION WITH CORONARY ANGIOGRAM;  Surgeon: Lorretta Harp, MD;  Location: [facility] CATH LAB;  Service: Cardiovascular;  Laterality: N/A;  . 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 PERCUTANEOUS CORONARY STENT INTERVENTION (PCI-S)  1999   RCA  . PROSTATE SURGERY     TURP  . VIDEO ASSISTED THORACOSCOPY (VATS)/EMPYEMA Right 07/13/1999   drainage of post-operative empyema following RLLobectomy    Current Medications: Outpatient Medications Prior to Visit  Medication Sig Dispense Refill  . albuterol (PROVENTIL HFA;VENTOLIN HFA) 108 (90 BASE) MCG/ACT inhaler Inhale 2 puffs into the lungs every 6 (six) hours as needed  for wheezing or shortness of breath.    Marland Kitchen aspirin 81 MG tablet Take 81 mg by mouth at bedtime.     Marland Kitchen ipratropium (ATROVENT) 0.03 % nasal spray Place 1 spray into both nostrils 2 (two) times daily.   12  . ipratropium-albuterol (DUONEB) 0.5-2.5 (3) MG/3ML SOLN Take 3 mLs by nebulization 4 (four) times daily.     Marland Kitchen levothyroxine (SYNTHROID, LEVOTHROID) 75 MCG tablet Take 75 mcg by mouth daily before breakfast.    . predniSONE (DELTASONE) 10 MG tablet Take 10 mg by mouth daily with breakfast.    . FLOVENT HFA 110 MCG/ACT inhaler INL 1 PUFF PO BID  11  . furosemide (LASIX) 40 MG tablet TAKE 1 TABLET(40 MG) BY MOUTH DAILY 90 tablet 2  . lisinopril (PRINIVIL,ZESTRIL) 40 MG tablet 20 mg once.  11  . Red Yeast Rice 600 MG CAPS Take 1 capsule by mouth 2 (two) times daily.     No facility-administered medications prior to visit.      Allergies:   Demerol [meperidine] and Statins   Social History   Social History  . Marital status: Widowed    Spouse name: N/A  . Number of children: 1  . Years of education: N/A   Occupational History  . 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 Smoking status: Former Smoker    Packs/day: 1.00    Years: 30.00    Types: Cigarettes    Quit date: 03/03/1979  . Smokeless tobacco: Current User    Types: Chew  . Alcohol use No  . Drug use: No  . Sexual activity: Not Asked   Other Topics Concern  . None   Social History Narrative  . None     Family History:  The patient's family history includes Cancer in his brother and brother; Heart attack in his father and mother.   ROS:   Please see the history of present illness.    ROS All other systems reviewed and are negative.   PHYSICAL EXAM:   VS:  BP (!) 144/72   Pulse 70   Ht '5\' 8"'$  (1.727 m)   Wt 132 lb (59.9 kg)   BMI 20.07 kg/m    GEN: Well nourished, well developed, in no acute distress  HEENT: normal  Neck: no JVD, carotid bruits, or  masses Cardiac: RRR; no murmurs, rubs, or gallops,no edema  Respiratory:  clear to auscultation bilaterally, normal work of breathing GI: soft, nontender, nondistended, + BS MS: no deformity or atrophy  Skin: warm and dry,  no rash Neuro:  Alert and Oriented x 3, Strength and sensation are intact Psych: euthymic mood, full affect  Wt Readings from Last 3 Encounters:  08/15/16 132 lb (59.9 kg)  06/21/16 137 lb 9.6 oz (62.4 kg)  12/02/15 138 lb 6.4 oz (62.8 kg)      Studies/Labs Reviewed:   EKG:  EKG is not ordered today.  Outside EKG from [facility] has been reviewed, persistent inferior lead T-wave inversion unchanged from the previous EKG  Recent Labs: No results found for requested labs within last 8760 hours.   Lipid Panel No results found for: CHOL, TRIG, HDL, CHOLHDL, VLDL, LDLCALC, LDLDIRECT  Additional studies/ records that were reviewed today include:   Myoview 08/09/2015 Study Highlights    Nuclear stress EF: 61%.  There was no ST segment deviation noted during stress.  The study is normal.  This is a low risk study.  The left ventricular ejection fraction is normal (55-65%).    Carotid Doppler 01/26/2016 Heterogeneous plaque, bilaterally. Stable RICA velocities, now in 60-79% range of stenosis. Stable LICA velocities, now in 40-59% range, s/p left CEA with DPA. >50% RECA stenosis. Normal subclavian arteries, bilaterally. Patent vertebral arteries with antegrade flow.   CT of chest w contrast 06/11/2016   ASSESSMENT:    1. Progressive angina ([place])   2. Blood clotting disorder ([place])   3. Coronary artery disease involving coronary bypass graft of native heart without angina pectoris   4. PAD (peripheral artery disease) ([place])   5. Bilateral carotid artery stenosis   6. Malignant neoplasm of lower lobe of right lung ([place])   7. Chronic obstructive pulmonary disease, unspecified COPD type ([place])      PLAN:  In order of problems listed  above:  1. Class III angina: He has been having left arm pain associated with exertion and relieved by rest. This is similar to his previous angina prior to bypass surgery. I did try to manipulate his joint on physical exam, it does not appear he was having joint pain. I did discuss with Dr. Martinique DOD who agrees in this case the best option would be a cardiac catheterization. He is still recovering from his COPD exacerbation, I will try to arrange the cardiac catheterization for early next week. Unfortunately his primary cardiologist Dr. Gwenlyn Found is rounding next week, therefore will be one of the other interventional cardiologist who will perform the procedure.  - Risk and benefit of procedure explained to the patient who display clear understanding and agree to proceed. Discussed with patient possible procedural risk include bleeding, vascular injury, renal injury, arrythmia, MI, stroke and loss of limb or life.  - I did decrease his lisinopril to '20mg'$  daily while adding amlodipine 5 mg mainly for antianginal purposes. Unfortunately he cannot tolerate Imdur due to history of severe cluster headache.  2. CAD s/p CABG  LIMA to the distal LAD, SVG to OM1 by Dr. Roxy Manns on 11/20/2013: See above  3. Bilateral carotid artery stenosis: Moderate on last carotid ultrasound in September 2017. He will need a repeat ultrasound this September  4. Possible recurrent right lower lobe lung cancer on recent CT of chest: I did discuss with the family, according to the daughter-in-law, his oncologist does not think he would be a candidate for chemotherapy, he was referred to our radiation oncologist as well who recommended repeat image in June before deciding on further therapy. This may complicate cardec catheterization, will defer to interventional team to see if bare-metal stent would be  more appropriate.  5. COPD: [facility] to [facility] with COPD exacerbation, treated with multiple courses of antibiotics,  currently on maintenance dose of oral prednisone.    Medication Adjustments/Labs and Tests Ordered: Current medicines are reviewed at length with the patient today.  Concerns regarding medicines are outlined above.  Medication changes, Labs and Tests ordered today are listed in the Patient Instructions below. Patient Instructions  Medication Instructions:  DECREASE LISINOPRIL TO 20 MG DAILY START AMLODIPINE 5 MG DAILY  If you need a refill on your cardiac medications before your next appointment, please call your pharmacy.  Labwork: TODAY AT [facility] ON THE 1ST FLOOR:CBC, BMET, PT/INR  Testing/Procedures: Your physician has requested that you have a cardiac catheterization. Cardiac catheterization is used to diagnose and/or treat various heart conditions. Doctors may recommend this procedure for a number of different reasons. The most common reason is to evaluate chest pain. Chest pain can be a symptom of coronary artery disease (CAD), and cardiac catheterization can show whether plaque is narrowing or blocking your heart's arteries. This procedure is also used to evaluate the valves, as well as measure the blood flow and oxygen levels in different parts of your heart. For further information please visit [email or web address]. Please follow instruction sheet, as given.  Follow-Up: Your physician wants you to follow-up in: AFTER CATH PROCEDURE WITH DR Gwenlyn Found  Special Instructions:     Thank you for choosing [facility]!!    Khalen Styer [facility], PA-C [place], [place] [address] [place] [address] Dept: [phone] Loc: [place]  08/15/2016  You are scheduled for a Cardiac Catheterization on Monday, April 9 @ 530 AM with Dr. Burt Knack.  1. Please arrive at the [facility] (Main Entrance A) at [facility] [place], [place] [address] at 530 am (two hours before your procedure to ensure your preparation). Free valet parking service is available.   Special note: Every effort is made to have your procedure done on time. Please understand that emergencies sometimes delay scheduled procedures.  2. Diet: Do not eat or drink anything after midnight prior to your procedure except sips of water to take medications.  3. Labs: You will need to have blood drawn on Monday, April 4 at [place] [address], [place]  Open: [place] (Lunch 12:30 - 1:30)   Phone: [phone]. You do not need to be fasting.  4. Medication instructions in preparation for your procedure:   Current Outpatient Prescriptions (Endocrine & Metabolic):  .  levothyroxine (SYNTHROID, LEVOTHROID) 75 MCG tablet, Take 75 mcg by mouth daily before breakfast. .  predniSONE (DELTASONE) 10 MG tablet, Take 10 mg by mouth daily with breakfast.  Current Outpatient Prescriptions (Cardiovascular):  .  furosemide (LASIX) 40 MG tablet, Take 1 tablet (40 mg total) by mouth daily. Marland Kitchen  lisinopril (PRINIVIL,ZESTRIL) 40 MG tablet, 40 mg once. Marland Kitchen  amLODipine (NORVASC) 5 MG tablet, Take 1 tablet (5 mg total) by mouth daily.  Current Outpatient Prescriptions (Respiratory):  .  albuterol (PROVENTIL HFA;VENTOLIN HFA) 108 (90 BASE) MCG/ACT inhaler, Inhale 2 puffs into the lungs every 6 (six) hours as needed for wheezing or shortness of breath. Cristy Friedlander HFA 110 MCG/ACT inhaler, INL 1 PUFF PO BID .  ipratropium (ATROVENT) 0.03 % nasal spray, Place 1 spray into both nostrils 2 (two) times  daily.  .  ipratropium-albuterol (DUONEB) 0.5-2.5 (3) MG/3ML SOLN, Take 3 mLs by nebulization 4 (four) times daily.  .  mometasone-formoterol (DULERA) 200-5 MCG/ACT AERO, Inhale 2 puffs into the lungs 2 (two) times daily. Marland Kitchen  MUCINEX 600 MG 12 hr tablet, Take 600 mg by mouth 2 (two) times daily.  Current Outpatient Prescriptions (Analgesics):  .  aspirin 81 MG  tablet, Take 81 mg by mouth daily.   *For reference purposes while preparing patient instructions.   Delete this med list prior to printing instructions for patient.*  Stop taking on Sunday, April 8.-HOLD LASIX (DAY BEFORE AND DAY OF PROCEDURE)     On the morning of your procedure, take your all your morning medicines NOT listed above.  You may use sips of water.  5. Plan for one night stay--bring personal belongings. 6. Bring a current list of your medications and current insurance cards. 7. You MUST have a responsible person to drive you home. 8. Someone MUST be with you the first 24 hours after you arrive home or your discharge will be delayed. 9. Please wear clothes that are easy to get on and off and wear slip-on shoes.  Thank you for allowing [place] to care for you!   -- [facility]     Signed, Almyra Deforest, [place]  08/15/2016 6:36 PM    [address], [place], [place]  [address] Phone: [phone]; Fax: [phone]

## 2016-08-15 NOTE — Telephone Encounter (Signed)
Pt says the new prescriptions for Amlodipine and Lisinopril have not been called in,. Please do thisasap.Thank you so much.

## 2016-08-16 ENCOUNTER — Other Ambulatory Visit: Payer: Self-pay | Admitting: Physician Assistant

## 2016-08-16 DIAGNOSIS — J449 Chronic obstructive pulmonary disease, unspecified: Secondary | ICD-10-CM | POA: Diagnosis not present

## 2016-08-16 LAB — PROTIME-INR
INR: 1.1
PROTHROMBIN TIME: 12.1 s — AB (ref 9.0–11.5)

## 2016-08-16 LAB — APTT: APTT: 25 s (ref 22–34)

## 2016-08-20 ENCOUNTER — Other Ambulatory Visit: Payer: Self-pay | Admitting: Cardiology

## 2016-08-20 ENCOUNTER — Encounter (HOSPITAL_COMMUNITY): Payer: Self-pay | Admitting: Cardiology

## 2016-08-20 ENCOUNTER — Ambulatory Visit (HOSPITAL_COMMUNITY)
Admission: RE | Admit: 2016-08-20 | Discharge: 2016-08-20 | Disposition: A | Discharge status: Home / Self Care | Payer: Medicare Other | Source: Ambulatory Visit | Attending: Cardiovascular Disease | Admitting: Cardiovascular Disease

## 2016-08-20 ENCOUNTER — Encounter (HOSPITAL_COMMUNITY)
Admission: RE | Disposition: A | Discharge status: Home / Self Care | Payer: Self-pay | Source: Ambulatory Visit | Attending: Cardiovascular Disease

## 2016-08-20 DIAGNOSIS — Z7952 Long term (current) use of systemic steroids: Secondary | ICD-10-CM | POA: Diagnosis not present

## 2016-08-20 DIAGNOSIS — Z7982 Long term (current) use of aspirin: Secondary | ICD-10-CM | POA: Diagnosis not present

## 2016-08-20 DIAGNOSIS — Z8249 Family history of ischemic heart disease and other diseases of the circulatory system: Secondary | ICD-10-CM | POA: Diagnosis not present

## 2016-08-20 DIAGNOSIS — Z87891 Personal history of nicotine dependence: Secondary | ICD-10-CM | POA: Insufficient documentation

## 2016-08-20 DIAGNOSIS — I25118 Atherosclerotic heart disease of native coronary artery with other forms of angina pectoris: Secondary | ICD-10-CM | POA: Diagnosis not present

## 2016-08-20 DIAGNOSIS — D6851 Activated protein C resistance: Secondary | ICD-10-CM | POA: Insufficient documentation

## 2016-08-20 DIAGNOSIS — Y831 Surgical operation with implant of artificial internal device as the cause of abnormal reaction of the patient, or of later complication, without mention of misadventure at the time of the procedure: Secondary | ICD-10-CM | POA: Diagnosis not present

## 2016-08-20 DIAGNOSIS — I2584 Coronary atherosclerosis due to calcified coronary lesion: Secondary | ICD-10-CM | POA: Insufficient documentation

## 2016-08-20 DIAGNOSIS — E039 Hypothyroidism, unspecified: Secondary | ICD-10-CM | POA: Diagnosis not present

## 2016-08-20 DIAGNOSIS — T82855A Stenosis of coronary artery stent, initial encounter: Secondary | ICD-10-CM | POA: Diagnosis not present

## 2016-08-20 DIAGNOSIS — I129 Hypertensive chronic kidney disease with stage 1 through stage 4 chronic kidney disease, or unspecified chronic kidney disease: Secondary | ICD-10-CM | POA: Insufficient documentation

## 2016-08-20 DIAGNOSIS — R1310 Dysphagia, unspecified: Secondary | ICD-10-CM | POA: Diagnosis not present

## 2016-08-20 DIAGNOSIS — Z951 Presence of aortocoronary bypass graft: Secondary | ICD-10-CM | POA: Insufficient documentation

## 2016-08-20 DIAGNOSIS — I69391 Dysphagia following cerebral infarction: Secondary | ICD-10-CM | POA: Insufficient documentation

## 2016-08-20 DIAGNOSIS — Z77090 Contact with and (suspected) exposure to asbestos: Secondary | ICD-10-CM | POA: Insufficient documentation

## 2016-08-20 DIAGNOSIS — I739 Peripheral vascular disease, unspecified: Secondary | ICD-10-CM | POA: Diagnosis not present

## 2016-08-20 DIAGNOSIS — Z79899 Other long term (current) drug therapy: Secondary | ICD-10-CM

## 2016-08-20 DIAGNOSIS — J449 Chronic obstructive pulmonary disease, unspecified: Secondary | ICD-10-CM | POA: Diagnosis not present

## 2016-08-20 DIAGNOSIS — I2582 Chronic total occlusion of coronary artery: Secondary | ICD-10-CM | POA: Diagnosis not present

## 2016-08-20 DIAGNOSIS — N189 Chronic kidney disease, unspecified: Secondary | ICD-10-CM | POA: Diagnosis not present

## 2016-08-20 DIAGNOSIS — E785 Hyperlipidemia, unspecified: Secondary | ICD-10-CM | POA: Insufficient documentation

## 2016-08-20 DIAGNOSIS — C3431 Malignant neoplasm of lower lobe, right bronchus or lung: Secondary | ICD-10-CM | POA: Diagnosis not present

## 2016-08-20 DIAGNOSIS — Z955 Presence of coronary angioplasty implant and graft: Secondary | ICD-10-CM

## 2016-08-20 DIAGNOSIS — Z923 Personal history of irradiation: Secondary | ICD-10-CM | POA: Diagnosis not present

## 2016-08-20 DIAGNOSIS — I6523 Occlusion and stenosis of bilateral carotid arteries: Secondary | ICD-10-CM | POA: Diagnosis not present

## 2016-08-20 HISTORY — PX: CORONARY STENT INTERVENTION: CATH118234

## 2016-08-20 HISTORY — PX: LEFT HEART CATH AND CORONARY ANGIOGRAPHY: CATH118249

## 2016-08-20 LAB — BASIC METABOLIC PANEL
ANION GAP: 8 (ref 5–15)
BUN: 24 mg/dL — AB (ref 6–20)
CO2: 26 mmol/L (ref 22–32)
Calcium: 9.9 mg/dL (ref 8.9–10.3)
Chloride: 108 mmol/L (ref 101–111)
Creatinine, Ser: 1.27 mg/dL — ABNORMAL HIGH (ref 0.61–1.24)
GFR calc Af Amer: 58 mL/min — ABNORMAL LOW (ref >60)
GFR calc non Af Amer: 50 mL/min — ABNORMAL LOW (ref >60)
GLUCOSE: 79 mg/dL (ref 65–99)
POTASSIUM: 4.6 mmol/L (ref 3.5–5.1)
Sodium: 142 mmol/L (ref 135–145)

## 2016-08-20 LAB — POCT ACTIVATED CLOTTING TIME: ACTIVATED CLOTTING TIME: 257 s

## 2016-08-20 SURGERY — LEFT HEART CATH AND CORONARY ANGIOGRAPHY
Anesthesia: LOCAL

## 2016-08-20 MED ORDER — NITROGLYCERIN 1 MG/10 ML FOR IR/CATH LAB
INTRA_ARTERIAL | Status: AC
  Filled 2016-08-20: qty 10

## 2016-08-20 MED ORDER — IPRATROPIUM BROMIDE 0.03 % NA SOLN
1.0000 | Freq: Two times a day (BID) | NASAL | Status: DC
Start: 2016-08-20 — End: 2016-08-20

## 2016-08-20 MED ORDER — SODIUM CHLORIDE 0.9% FLUSH: 3.0000 mL | INTRAVENOUS | Status: DC | PRN

## 2016-08-20 MED ORDER — SODIUM CHLORIDE 0.9 % IV SOLN: 250.0000 mL | INTRAVENOUS | Status: DC | PRN

## 2016-08-20 MED ORDER — ACETAMINOPHEN 325 MG PO TABS: 650.0000 mg | ORAL_TABLET | ORAL | Status: DC | PRN

## 2016-08-20 MED ORDER — ASPIRIN 81 MG PO TABS: 81.0000 mg | ORAL_TABLET | Freq: Every day | ORAL | Status: DC

## 2016-08-20 MED ORDER — GUAIFENESIN ER 600 MG PO TB12
600.0000 mg | ORAL_TABLET | Freq: Two times a day (BID) | ORAL | Status: DC
Start: 2016-08-20 — End: 2016-08-20

## 2016-08-20 MED ORDER — FAMOTIDINE IN NACL 20-0.9 MG/50ML-% IV SOLN
INTRAVENOUS | Status: DC | PRN
  Administered 2016-08-20: 20 mg via INTRAVENOUS

## 2016-08-20 MED ORDER — MONTELUKAST SODIUM 10 MG PO TABS: 10.0000 mg | ORAL_TABLET | Freq: Every day | ORAL | Status: DC

## 2016-08-20 MED ORDER — LEVOTHYROXINE SODIUM 75 MCG PO TABS: 75.0000 ug | ORAL_TABLET | Freq: Every day | ORAL | Status: DC

## 2016-08-20 MED ORDER — HYDRALAZINE HCL 20 MG/ML IJ SOLN
INTRAMUSCULAR | Status: AC
  Filled 2016-08-20: qty 1

## 2016-08-20 MED ORDER — HEPARIN SODIUM (PORCINE) 1000 UNIT/ML IJ SOLN
INTRAMUSCULAR | Status: AC
  Filled 2016-08-20: qty 1

## 2016-08-20 MED ORDER — HEPARIN (PORCINE) IN NACL 2-0.9 UNIT/ML-% IJ SOLN
INTRAMUSCULAR | Status: DC | PRN
  Administered 2016-08-20: 1000 mL

## 2016-08-20 MED ORDER — IPRATROPIUM-ALBUTEROL 0.5-2.5 (3) MG/3ML IN SOLN: 3.0000 mL | Freq: Four times a day (QID) | RESPIRATORY_TRACT | Status: DC

## 2016-08-20 MED ORDER — ASPIRIN 81 MG PO CHEW
81.0000 mg | CHEWABLE_TABLET | ORAL | Status: AC
  Administered 2016-08-20: 81 mg via ORAL

## 2016-08-20 MED ORDER — ASPIRIN 81 MG PO CHEW
CHEWABLE_TABLET | ORAL | Status: AC
  Administered 2016-08-20: 81 mg via ORAL
  Filled 2016-08-20: qty 1

## 2016-08-20 MED ORDER — FENTANYL CITRATE (PF) 100 MCG/2ML IJ SOLN
INTRAMUSCULAR | Status: AC
  Filled 2016-08-20: qty 2

## 2016-08-20 MED ORDER — FENTANYL CITRATE (PF) 100 MCG/2ML IJ SOLN
INTRAMUSCULAR | Status: DC | PRN
  Administered 2016-08-20: 25 ug via INTRAVENOUS

## 2016-08-20 MED ORDER — VERAPAMIL HCL 2.5 MG/ML IV SOLN
INTRAVENOUS | Status: DC | PRN
  Administered 2016-08-20: 10 mL via INTRA_ARTERIAL

## 2016-08-20 MED ORDER — HYDRALAZINE HCL 20 MG/ML IJ SOLN
INTRAMUSCULAR | Status: DC | PRN
  Administered 2016-08-20: 10 mg via INTRAVENOUS

## 2016-08-20 MED ORDER — LIDOCAINE HCL (PF) 1 % IJ SOLN
INTRAMUSCULAR | Status: DC | PRN
Start: 2016-08-20 — End: 2016-08-20
  Administered 2016-08-20: 2 mL

## 2016-08-20 MED ORDER — AMLODIPINE BESYLATE 5 MG PO TABS: 5.0000 mg | ORAL_TABLET | Freq: Every day | ORAL | Status: DC

## 2016-08-20 MED ORDER — SODIUM CHLORIDE 0.9% FLUSH: 3.0000 mL | Freq: Two times a day (BID) | INTRAVENOUS | Status: DC

## 2016-08-20 MED ORDER — LISINOPRIL 20 MG PO TABS: 20.0000 mg | ORAL_TABLET | Freq: Every day | ORAL | Status: DC

## 2016-08-20 MED ORDER — SODIUM CHLORIDE 0.9 % WEIGHT BASED INFUSION: 1.0000 mL/kg/h | INTRAVENOUS | Status: DC

## 2016-08-20 MED ORDER — MIDAZOLAM HCL 2 MG/2ML IJ SOLN
INTRAMUSCULAR | Status: AC
  Filled 2016-08-20: qty 2

## 2016-08-20 MED ORDER — CLOPIDOGREL BISULFATE 300 MG PO TABS
ORAL_TABLET | ORAL | Status: DC | PRN
  Administered 2016-08-20: 600 mg via ORAL

## 2016-08-20 MED ORDER — MOMETASONE FURO-FORMOTEROL FUM 200-5 MCG/ACT IN AERO: 2.0000 | INHALATION_SPRAY | Freq: Two times a day (BID) | RESPIRATORY_TRACT | Status: DC

## 2016-08-20 MED ORDER — CLOPIDOGREL BISULFATE 75 MG PO TABS: 75.0000 mg | ORAL_TABLET | Freq: Every day | ORAL | Status: DC

## 2016-08-20 MED ORDER — HEPARIN (PORCINE) IN NACL 2-0.9 UNIT/ML-% IJ SOLN
INTRAMUSCULAR | Status: AC
  Filled 2016-08-20: qty 1000

## 2016-08-20 MED ORDER — CLOPIDOGREL BISULFATE 75 MG PO TABS: 75.0000 mg | ORAL_TABLET | Freq: Every day | ORAL | 6 refills | Status: DC

## 2016-08-20 MED ORDER — NITROGLYCERIN 1 MG/10 ML FOR IR/CATH LAB
INTRA_ARTERIAL | Status: DC | PRN
  Administered 2016-08-20: 150 ug via INTRACORONARY

## 2016-08-20 MED ORDER — IOPAMIDOL (ISOVUE-370) INJECTION 76%
INTRAVENOUS | Status: AC
  Filled 2016-08-20: qty 125

## 2016-08-20 MED ORDER — LABETALOL HCL 5 MG/ML IV SOLN: 10.0000 mg | INTRAVENOUS | Status: AC | PRN

## 2016-08-20 MED ORDER — HYDRALAZINE HCL 20 MG/ML IJ SOLN: 5.0000 mg | INTRAMUSCULAR | Status: AC | PRN

## 2016-08-20 MED ORDER — CO Q 10 10 MG PO CAPS: ORAL_CAPSULE | Freq: Every day | ORAL | Status: DC

## 2016-08-20 MED ORDER — MIDAZOLAM HCL 2 MG/2ML IJ SOLN
INTRAMUSCULAR | Status: DC | PRN
  Administered 2016-08-20 (×2): 1 mg via INTRAVENOUS

## 2016-08-20 MED ORDER — ONDANSETRON HCL 4 MG/2ML IJ SOLN: 4.0000 mg | Freq: Four times a day (QID) | INTRAMUSCULAR | Status: DC | PRN

## 2016-08-20 MED ORDER — ALBUTEROL SULFATE HFA 108 (90 BASE) MCG/ACT IN AERS: 2.0000 | INHALATION_SPRAY | Freq: Four times a day (QID) | RESPIRATORY_TRACT | Status: DC | PRN

## 2016-08-20 MED ORDER — CLOPIDOGREL BISULFATE 75 MG PO TABS: 75.0000 mg | ORAL_TABLET | Freq: Every day | ORAL | 0 refills | Status: DC

## 2016-08-20 MED ORDER — LIDOCAINE HCL (PF) 1 % IJ SOLN
INTRAMUSCULAR | Status: AC
Start: 2016-08-20 — End: ?
  Filled 2016-08-20: qty 30

## 2016-08-20 MED ORDER — SODIUM CHLORIDE 0.9 % WEIGHT BASED INFUSION
3.0000 mL/kg/h | INTRAVENOUS | Status: DC
  Administered 2016-08-20: 3 mL/kg/h via INTRAVENOUS

## 2016-08-20 MED ORDER — FUROSEMIDE 40 MG PO TABS: 40.0000 mg | ORAL_TABLET | Freq: Every day | ORAL | Status: DC

## 2016-08-20 MED ORDER — IOPAMIDOL (ISOVUE-370) INJECTION 76%
INTRAVENOUS | Status: DC | PRN
  Administered 2016-08-20: 115 mL via INTRAVENOUS

## 2016-08-20 MED ORDER — PREDNISONE 10 MG PO TABS: 10.0000 mg | ORAL_TABLET | Freq: Every day | ORAL | Status: DC

## 2016-08-20 MED ORDER — HEPARIN SODIUM (PORCINE) 1000 UNIT/ML IJ SOLN
INTRAMUSCULAR | Status: DC | PRN
  Administered 2016-08-20: 4000 [IU] via INTRAVENOUS
  Administered 2016-08-20 (×2): 2000 [IU] via INTRAVENOUS

## 2016-08-20 MED ORDER — FAMOTIDINE IN NACL 20-0.9 MG/50ML-% IV SOLN
INTRAVENOUS | Status: AC
  Filled 2016-08-20: qty 50

## 2016-08-20 MED ORDER — ANGIOPLASTY BOOK
Freq: Once | Status: DC
  Filled 2016-08-20: qty 1

## 2016-08-20 MED ORDER — VERAPAMIL HCL 2.5 MG/ML IV SOLN
INTRAVENOUS | Status: AC
  Filled 2016-08-20: qty 2

## 2016-08-20 MED ORDER — SODIUM CHLORIDE 0.9 % IV SOLN
INTRAVENOUS | Status: AC
Start: 2016-08-20 — End: 2016-08-20

## 2016-08-20 MED FILL — CLOPIDOGREL 75 MG TABLET: 75 | 30 days supply | Qty: 30 | Fill #0

## 2016-08-20 SURGICAL SUPPLY — 20 items
BALLN MOZEC 2.50X14 (BALLOONS) ×2
BALLN ~~LOC~~ EUPHORA RX 3.0X15 (BALLOONS) ×2
BALLOON MOZEC 2.50X14 (BALLOONS) ×1 IMPLANT
BALLOON ~~LOC~~ EUPHORA RX 3.0X15 (BALLOONS) ×1 IMPLANT
CATH INFINITI 5 FR AL2 (CATHETERS) ×2 IMPLANT
CATH INFINITI 5FR AL1 (CATHETERS) ×2 IMPLANT
CATH INFINITI 5FR MULTPACK ANG (CATHETERS) ×2 IMPLANT
CATH VISTA GUIDE 6FR JR4 (CATHETERS) ×2 IMPLANT
DEVICE RAD COMP TR BAND LRG (VASCULAR PRODUCTS) ×2 IMPLANT
GLIDESHEATH SLEND SS 6F .021 (SHEATH) ×2 IMPLANT
GUIDE CATH RUNWAY 6FR AL2 (CATHETERS) IMPLANT
GUIDEWIRE INQWIRE 1.5J.035X260 (WIRE) ×1 IMPLANT
INQWIRE 1.5J .035X260CM (WIRE) ×2
KIT ENCORE 26 ADVANTAGE (KITS) ×2 IMPLANT
KIT HEART LEFT (KITS) ×2 IMPLANT
PACK CARDIAC CATHETERIZATION (CUSTOM PROCEDURE TRAY) ×2 IMPLANT
STENT SYNERGY DES 2.75X20 (Permanent Stent) ×2 IMPLANT
TRANSDUCER W/STOPCOCK (MISCELLANEOUS) ×2 IMPLANT
TUBING CIL FLEX 10 FLL-RA (TUBING) ×2 IMPLANT
WIRE COUGAR XT STRL 190CM (WIRE) ×2 IMPLANT

## 2016-08-20 NOTE — Progress Notes (Signed)
[id number] Discussed with pt importance of plavix with stent. Pt stated he is not going to take NTG so encouraged to call 911 with recurrent arm pain not relieved with rest. Encouraged to watch salt but otherwise pt eats what he can tolerate. Needs calories and nutrition to support COPD. Encouraged walking for ex and guidelines given. Pt has been attending Pulmonary rehab for years. Will send referral to [facility] and let them follow up with pt to determine which program cardiologist would like for him to attend. Graylon Good RN BSN 08/20/2016 2:22 PM

## 2016-08-20 NOTE — Progress Notes (Signed)
Pt called from the room because of left radial site bleeding.  Max air was added and oozing still occurred. Tr band was removed and pressure held until Dr Burt Knack came to assess and repositioned and replaced TR band. No bleeding at this time will resume removing air from the band in 30 minutes.

## 2016-08-20 NOTE — Progress Notes (Signed)
TRB site WNL. No further bleeding. Pharmacy has delivered pt's plavix and pt and family verbalize understand of all discharge instructions.

## 2016-08-20 NOTE — H&P (View-Only) (Signed)
Cardiology Office Note    Date:  08/15/2016   ID:  Glenn Reilly, DOB Sep 13, 1928, MRN [id number]  PCP:  Townsend Roger, MD  Cardiologist:  Dr. Gwenlyn Found  Chief Complaint  Patient presents with  . Follow-up    seen for Dr. Gwenlyn Found, having left arm pain, no chest pain, has copd, has dizziness after starting lisinopril rx from [place], never had sx before when taking med from regular pharmacy    History of Present Illness:  Glenn Reilly is a 83 y.o. male with PMH of CAD s/p CABG 11/20/2013 LIMA to LAD and SVG to circumflex, PAD s/p bilateral iliac stenting remotely, carotid artery disease, hypertension, hyperlipidemia, h/o lung CA s/p R lower lobectomy and COPD. He initially underwent cardiac catheterization on 11/05/2013 which showed 50% left main, 70% proximal LAD, 75-80% left circumflex lesion. CT surgery was recommended, and he subsequently underwent the off-pump two-vessel CABG with LIMA to the distal LAD, SVG to OM1 by Dr. Roxy Manns on 11/20/2013. The patient was evaluated by Dr. Gwenlyn Found in September 2016 for increasing dyspnea on exertion, lower extremity edema and upper extremity discomfort. His Lasix was increased. Echocardiogram obtained on 01/26/2015 showed EF 76-28%, grade 1 diastolic dysfunction, peak PA pressure 19 mmHg. Myoview obtained on 01/25/2015 showed normal perfusion, EF 55-65%, low risk study. Lower extremity venous ultrasound was negative for blood clot. Carotid ultrasound performed in the same month showed moderate right ICA stenosis 40-59%, recommend 12 month follow-up.  I last saw the patient in March 2017, he was complaining some occasional chest pressure. Repeat Myoview obtained on 08/09/2015 showed EF 61%, no obvious ischemia, overall low risk study. He had a negative CT angiogram of the chest on 08/20/2015 at [facility]. He had a repeat carotid Doppler on 01/26/2016 which showed moderate bilateral carotid artery stenosis, one year repeat image was recommended. More recently, it  appears he was seen at [facility] again, CT of the chest obtained in January was concerning for recurrent lung cancer.  Patient presented today for left arm pain evaluation accompanied by his son and daughter-in-law. According to the patient, he never had chest pain even prior to the previous bypass surgery, and has always been bilateral arm pain associated with exertion. More recently in the last few weeks, he has been having left arm pain with exertion and relieved by rest. Surprisingly, he does not have right arm pain. I rotated his arm during physical exam, rotation of the joint does not cause any pain. He says this is very much reminiscent of the previous angina. He has been consistently noting exertional chest discomfort every time he woke up a small incline or even take a shower. Per family, apparently taken a shower strenuous for him. He is still recovering from the recent pneumonia and COPD exacerbation, he is currently on the maintenance dose of prednisone 10 mg daily I have reviewed the outside records from [facility], his recent EKG has not changed. Furthermore he mentions his current dose of lisinopril from [place] may be a little bit too much for him as sometimes his systolic blood pressure dipped down to the 90s. I will decrease his lisinopril to 20 mg daily while adding amlodipine 5 mg daily for antianginal purposes as his symptom is consistent with progressive class III angina. I have also discussed with DOD Dr. Martinique who recommended cardiac catheterization to further assess the coronary anatomy.  I did ask the patient about his recent diagnosis of possible recurrent lung cancer, according  to the daughter-in-law, he was told by his oncologist that he would not be a good candidate for chemotherapy. He was referred to our radiation oncologist who recommended repeat image in June before proceeding with further recommendation. He does not expect any biopsy prior to that.   Past  Medical History:  Diagnosis Date  . Asbestosis ([place])   . Asthma   . CAD, [place] PCI 2004 Cath 11/05/2013 with 50% LMain, 75% prox LAD, 80% prox LCx, 50% RCA with normal EF    . Carotid artery disease ([place])   . Cataract   . Complication of anesthesia    lt side throat parlysis  . COPD (chronic obstructive pulmonary disease) ([place])   . COPD with emphysema ([place]) 01/13/2010   Spirometry 10/19/2013 is reviewed. This reveals FEV1 44% predicted FVC 47% predicted and FEF 25 7537% predicted   . Detached retina   . Dysphagia as late effect of stroke    Developed as complication of stroke following left carotid endarterectomy with documented left hypoglossal nerve paralysis   . Factor 5 Leiden mutation, heterozygous ([place])   . 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 HTN (hypertension)   . Hyperlipemia   . HYPERLIPIDEMIA-MIXED 01/13/2010   Qualifier: Diagnosis of  By: Olevia Perches, MD, Glenetta Hew   . HYPERTENSION, BENIGN 01/13/2010   Qualifier: Diagnosis of  By: Olevia Perches, MD, Glenetta Hew   . Hypothyroidism   . Lung cancer ([place]) 2001   lower right lobe surgery to remove cancer, no chemo or radiation  . Lung mass 10/19/2013   CT of the chest 10/12/2013 reveals an enlarging left upper lobe irregular nodule. Now 1.4 compared to 1.0 cm in diameter when compared to March 2015 CT scan. There is calcified pleural plaques seen bilaterally. There is subpleural scarring seen as well. Previous right lower lobectomy has been demonstrated.  PET-CT scan from June 2015 is reviewed this shows increased uptake (SUV > 8) in left upper   . PVD 01/13/2010   Qualifier: Diagnosis of  By: Olevia Perches, MD, Glenetta Hew   . PVD (peripheral vascular disease) ([place]) 2011   carotid endarterectomy, pv stents  . Radiation 9/1, 9/3, 01/19/14   Left upper lung  3 fractions  . S/P CABG x 2 11/20/2013   Off-pump CABG x2 using LIMA to LAD, SVG to OM1, EVH via right thigh  . Stroke [facility]) 07/2009   left carotid endarterectomy    Past Surgical History:  Procedure Laterality Date  . APPENDECTOMY    . BIOPSY Left 11/20/2013   Procedure: BIOPSY Left Upper Lobe;  Surgeon: Rexene Alberts, MD;  Location: [place];  Service: Open Heart Surgery;  Laterality: Left;  . CAROTID ENDARTERECTOMY Left 2011   [facility] - complicated by post-op stroke  . CORONARY ARTERY BYPASS GRAFT N/A 11/20/2013   Procedure: OFF PUMP CORONARY ARTERY BYPASS GRAFTING (CABG) x2: LIMA to LAD, SVG to Obtuse Marginal 1.;  Surgeon: Rexene Alberts, MD;  Location: [place];  Service: Open Heart Surgery;  Laterality: N/A;  . FRACTURE SURGERY Left    foot  . HEMORRHOID SURGERY    . ILIAC ARTERY STENT Bilateral 2011   [facility] - performed for asymptomatic iliac artery disease  . INTRAOPERATIVE TRANSESOPHAGEAL ECHOCARDIOGRAM N/A 11/20/2013   Procedure:  INTRAOPERATIVE TRANSESOPHAGEAL ECHOCARDIOGRAM;  Surgeon: Rexene Alberts, MD;  Location: [place];  Service: Open Heart Surgery;  Laterality: N/A;  . LEFT HEART CATHETERIZATION WITH CORONARY ANGIOGRAM N/A 11/05/2013   Procedure: LEFT HEART CATHETERIZATION WITH CORONARY ANGIOGRAM;  Surgeon: Lorretta Harp, MD;  Location: [facility] CATH LAB;  Service: Cardiovascular;  Laterality: N/A;  . 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 PERCUTANEOUS CORONARY STENT INTERVENTION (PCI-S)  1999   RCA  . PROSTATE SURGERY     TURP  . VIDEO ASSISTED THORACOSCOPY (VATS)/EMPYEMA Right 07/13/1999   drainage of post-operative empyema following RLLobectomy    Current Medications: Outpatient Medications Prior to Visit  Medication Sig Dispense Refill  . albuterol (PROVENTIL HFA;VENTOLIN HFA) 108 (90 BASE) MCG/ACT inhaler Inhale 2 puffs into the lungs every 6 (six) hours as needed  for wheezing or shortness of breath.    Marland Kitchen aspirin 81 MG tablet Take 81 mg by mouth at bedtime.     Marland Kitchen ipratropium (ATROVENT) 0.03 % nasal spray Place 1 spray into both nostrils 2 (two) times daily.   12  . ipratropium-albuterol (DUONEB) 0.5-2.5 (3) MG/3ML SOLN Take 3 mLs by nebulization 4 (four) times daily.     Marland Kitchen levothyroxine (SYNTHROID, LEVOTHROID) 75 MCG tablet Take 75 mcg by mouth daily before breakfast.    . predniSONE (DELTASONE) 10 MG tablet Take 10 mg by mouth daily with breakfast.    . FLOVENT HFA 110 MCG/ACT inhaler INL 1 PUFF PO BID  11  . furosemide (LASIX) 40 MG tablet TAKE 1 TABLET(40 MG) BY MOUTH DAILY 90 tablet 2  . lisinopril (PRINIVIL,ZESTRIL) 40 MG tablet 20 mg once.  11  . Red Yeast Rice 600 MG CAPS Take 1 capsule by mouth 2 (two) times daily.     No facility-administered medications prior to visit.      Allergies:   Demerol [meperidine] and Statins   Social History   Social History  . Marital status: Widowed    Spouse name: N/A  . Number of children: 1  . Years of education: N/A   Occupational History  . 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 Smoking status: Former Smoker    Packs/day: 1.00    Years: 30.00    Types: Cigarettes    Quit date: 03/03/1979  . Smokeless tobacco: Current User    Types: Chew  . Alcohol use No  . Drug use: No  . Sexual activity: Not Asked   Other Topics Concern  . None   Social History Narrative  . None     Family History:  The patient's family history includes Cancer in his brother and brother; Heart attack in his father and mother.   ROS:   Please see the history of present illness.    ROS All other systems reviewed and are negative.   PHYSICAL EXAM:   VS:  BP (!) 144/72   Pulse 70   Ht '5\' 8"'$  (1.727 m)   Wt 132 lb (59.9 kg)   BMI 20.07 kg/m    GEN: Well nourished, well developed, in no acute distress  HEENT: normal  Neck: no JVD, carotid bruits, or  masses Cardiac: RRR; no murmurs, rubs, or gallops,no edema  Respiratory:  clear to auscultation bilaterally, normal work of breathing GI: soft, nontender, nondistended, + BS MS: no deformity or atrophy  Skin: warm and dry,  no rash Neuro:  Alert and Oriented x 3, Strength and sensation are intact Psych: euthymic mood, full affect  Wt Readings from Last 3 Encounters:  08/15/16 132 lb (59.9 kg)  06/21/16 137 lb 9.6 oz (62.4 kg)  12/02/15 138 lb 6.4 oz (62.8 kg)      Studies/Labs Reviewed:   EKG:  EKG is not ordered today.  Outside EKG from [facility] has been reviewed, persistent inferior lead T-wave inversion unchanged from the previous EKG  Recent Labs: No results found for requested labs within last 8760 hours.   Lipid Panel No results found for: CHOL, TRIG, HDL, CHOLHDL, VLDL, LDLCALC, LDLDIRECT  Additional studies/ records that were reviewed today include:   Myoview 08/09/2015 Study Highlights    Nuclear stress EF: 61%.  There was no ST segment deviation noted during stress.  The study is normal.  This is a low risk study.  The left ventricular ejection fraction is normal (55-65%).    Carotid Doppler 01/26/2016 Heterogeneous plaque, bilaterally. Stable RICA velocities, now in 60-79% range of stenosis. Stable LICA velocities, now in 40-59% range, s/p left CEA with DPA. >50% RECA stenosis. Normal subclavian arteries, bilaterally. Patent vertebral arteries with antegrade flow.   CT of chest w contrast 06/11/2016   ASSESSMENT:    1. Progressive angina ([place])   2. Blood clotting disorder ([place])   3. Coronary artery disease involving coronary bypass graft of native heart without angina pectoris   4. PAD (peripheral artery disease) ([place])   5. Bilateral carotid artery stenosis   6. Malignant neoplasm of lower lobe of right lung ([place])   7. Chronic obstructive pulmonary disease, unspecified COPD type ([place])      PLAN:  In order of problems listed  above:  1. Class III angina: He has been having left arm pain associated with exertion and relieved by rest. This is similar to his previous angina prior to bypass surgery. I did try to manipulate his joint on physical exam, it does not appear he was having joint pain. I did discuss with Dr. Martinique DOD who agrees in this case the best option would be a cardiac catheterization. He is still recovering from his COPD exacerbation, I will try to arrange the cardiac catheterization for early next week. Unfortunately his primary cardiologist Dr. Gwenlyn Found is rounding next week, therefore will be one of the other interventional cardiologist who will perform the procedure.  - Risk and benefit of procedure explained to the patient who display clear understanding and agree to proceed. Discussed with patient possible procedural risk include bleeding, vascular injury, renal injury, arrythmia, MI, stroke and loss of limb or life.  - I did decrease his lisinopril to '20mg'$  daily while adding amlodipine 5 mg mainly for antianginal purposes. Unfortunately he cannot tolerate Imdur due to history of severe cluster headache.  2. CAD s/p CABG  LIMA to the distal LAD, SVG to OM1 by Dr. Roxy Manns on 11/20/2013: See above  3. Bilateral carotid artery stenosis: Moderate on last carotid ultrasound in September 2017. He will need a repeat ultrasound this September  4. Possible recurrent right lower lobe lung cancer on recent CT of chest: I did discuss with the family, according to the daughter-in-law, his oncologist does not think he would be a candidate for chemotherapy, he was referred to our radiation oncologist as well who recommended repeat image in June before deciding on further therapy. This may complicate cardec catheterization, will defer to interventional team to see if bare-metal stent would be  more appropriate.  5. COPD: Recently admitted to [facility] with COPD exacerbation, treated with multiple courses of antibiotics,  currently on maintenance dose of oral prednisone.    Medication Adjustments/Labs and Tests Ordered: Current medicines are reviewed at length with the patient today.  Concerns regarding medicines are outlined above.  Medication changes, Labs and Tests ordered today are listed in the Patient Instructions below. Patient Instructions  Medication Instructions:  DECREASE LISINOPRIL TO 20 MG DAILY START AMLODIPINE 5 MG DAILY  If you need a refill on your cardiac medications before your next appointment, please call your pharmacy.  Labwork: TODAY AT [facility] ON THE 1ST FLOOR:CBC, BMET, PT/INR  Testing/Procedures: Your physician has requested that you have a cardiac catheterization. Cardiac catheterization is used to diagnose and/or treat various heart conditions. Doctors may recommend this procedure for a number of different reasons. The most common reason is to evaluate chest pain. Chest pain can be a symptom of coronary artery disease (CAD), and cardiac catheterization can show whether plaque is narrowing or blocking your heart's arteries. This procedure is also used to evaluate the valves, as well as measure the blood flow and oxygen levels in different parts of your heart. For further information please visit [email or web address]. Please follow instruction sheet, as given.  Follow-Up: Your physician wants you to follow-up in: AFTER CATH PROCEDURE WITH DR Gwenlyn Found  Special Instructions:     Thank you for choosing [facility] at [facility]!!    Jorge Amparo [facility], PA-C [employer], [place] [address] [place] [address] Dept: [phone] Loc: [place]  08/15/2016  You are scheduled for a Cardiac Catheterization on Monday, April 9 @ 530 AM with Dr. Burt Knack.  1. Please arrive at the [facility] (Main Entrance A) at [facility]: [place], [place] [address] at 530 am (two hours before your procedure to ensure your preparation). Free valet parking service is available.   Special note: Every effort is made to have your procedure done on time. Please understand that emergencies sometimes delay scheduled procedures.  2. Diet: Do not eat or drink anything after midnight prior to your procedure except sips of water to take medications.  3. Labs: You will need to have blood drawn on Monday, April 4 at [address] [address], [place]  Open: [place] (Lunch 12:30 - 1:30)   Phone: [phone]. You do not need to be fasting.  4. Medication instructions in preparation for your procedure:   Current Outpatient Prescriptions (Endocrine & Metabolic):  .  levothyroxine (SYNTHROID, LEVOTHROID) 75 MCG tablet, Take 75 mcg by mouth daily before breakfast. .  predniSONE (DELTASONE) 10 MG tablet, Take 10 mg by mouth daily with breakfast.  Current Outpatient Prescriptions (Cardiovascular):  .  furosemide (LASIX) 40 MG tablet, Take 1 tablet (40 mg total) by mouth daily. Marland Kitchen  lisinopril (PRINIVIL,ZESTRIL) 40 MG tablet, 40 mg once. Marland Kitchen  amLODipine (NORVASC) 5 MG tablet, Take 1 tablet (5 mg total) by mouth daily.  Current Outpatient Prescriptions (Respiratory):  .  albuterol (PROVENTIL HFA;VENTOLIN HFA) 108 (90 BASE) MCG/ACT inhaler, Inhale 2 puffs into the lungs every 6 (six) hours as needed for wheezing or shortness of breath. Cristy Friedlander HFA 110 MCG/ACT inhaler, INL 1 PUFF PO BID .  ipratropium (ATROVENT) 0.03 % nasal spray, Place 1 spray into both nostrils 2 (two) times  daily.  .  ipratropium-albuterol (DUONEB) 0.5-2.5 (3) MG/3ML SOLN, Take 3 mLs by nebulization 4 (four) times daily.  .  mometasone-formoterol (DULERA) 200-5 MCG/ACT AERO, Inhale 2 puffs into the lungs 2 (two) times daily. Marland Kitchen  MUCINEX 600 MG 12 hr tablet, Take 600 mg by mouth 2 (two) times daily.  Current Outpatient Prescriptions (Analgesics):  .  aspirin 81 MG  tablet, Take 81 mg by mouth daily.   *For reference purposes while preparing patient instructions.   Delete this med list prior to printing instructions for patient.*  Stop taking on Sunday, April 8.-HOLD LASIX (DAY BEFORE AND DAY OF PROCEDURE)     On the morning of your procedure, take your all your morning medicines NOT listed above.  You may use sips of water.  5. Plan for one night stay--bring personal belongings. 6. Bring a current list of your medications and current insurance cards. 7. You MUST have a responsible person to drive you home. 8. Someone MUST be with you the first 24 hours after you arrive home or your discharge will be delayed. 9. Please wear clothes that are easy to get on and off and wear slip-on shoes.  Thank you for allowing [place] to care for you!   -- [facility] Invasive Cardiovascular services     Signed, Almyra Deforest, [place]  08/15/2016 6:36 PM    [address] [place], [place], [place]  [address] Phone: [phone]; Fax: [phone]

## 2016-08-20 NOTE — Progress Notes (Signed)
Report received from [facility]. Care assumed at this time.

## 2016-08-20 NOTE — Discharge Summary (Signed)
Discharge Summary    [name],  MRN: [id number], DOB/AGE: [birth year] 83 y.o.  Admit date: 08/20/2016 Discharge date: 08/20/2016  Primary Care Provider: Vassie Loll Eyk Primary Cardiologist: Gwenlyn Found   Discharge Diagnoses    Active Problems:   Coronary artery disease with exertional angina [facility])   Allergies Allergies  Allergen Reactions  . Demerol [Meperidine] Anaphylaxis  . Statins Other (See Comments)    Myalgia     Diagnostic Studies/Procedures    LHC: 08/20/16  Conclusion   1. Severe three-vessel coronary artery disease with total occlusion of the proximal LAD, moderately severe stenosis of the proximal circumflex, and severe in-stent restenosis in the RCA 2. Status post aortocoronary bypass surgery with continued patency of the LIMA to LAD and saphenous vein graft to first OM 3. 6 vessel PCI of severe in-stent restenosis of the mid RCA 4. Normal LVEDP (left ventriculography deferred because of chronic kidney disease)  Recommendations: Dual antiplatelet therapy 12 months as tolerated, if necessary could interrupt dual antiplatelet therapy after 3 months. Same day discharge after at least 6 hours of IV fluids. Repeat metabolic panel in 47-42 hours as outpatient.   _____________   History of Present Illness     Glenn Reilly is a 83 y.o. male with PMH of CAD s/p CABG 11/20/2013 LIMA to LAD and SVG to circumflex, PAD s/p bilateral iliac stenting remotely, carotid artery disease, hypertension, hyperlipidemia, h/o lung CA s/p R lower lobectomy and COPD. He initially underwent cardiac catheterization on 11/05/2013 which showed 50% left main, 70% proximal LAD, 75-80% left circumflex lesion. CT surgery was recommended, and he subsequently underwent the off-pump two-vessel CABG with LIMA to the distal LAD, SVG to OM1 by Dr. Roxy Manns on 11/20/2013. The patient was evaluated by Dr. Gwenlyn Found in September 2016 for increasing dyspnea on exertion, lower extremity edema and  upper extremity discomfort. His Lasix was increased. Echocardiogram obtained on 01/26/2015 showed EF 59-56%, grade 1 diastolic dysfunction, peak PA pressure 19 mmHg. Myoview obtained on 01/25/2015 showed normal perfusion, EF 55-65%, low risk study. Lower extremity venous ultrasound was negative for blood clot. Carotid ultrasound performed in the same month showed moderate right ICA stenosis 40-59%, recommend 12 month follow-up.  Seen in March 2017, he was complaining some occasional chest pressure. Repeat Myoview obtained on 08/09/2015 showed EF 61%, no obvious ischemia, overall low risk study. He had a negative CT angiogram of the chest on 08/20/2015 at [facility]. He had a repeat carotid Doppler on 01/26/2016 which showed moderate bilateral carotid artery stenosis, one year repeat image was recommended. More recently, it appears he was seen at [facility] again, CT of the chest obtained in January was concerning for recurrent lung cancer.  Patient presented 08/15/16 for left arm pain evaluation accompanied by his son and daughter-in-law. According to the patient, he never had chest pain even prior to the previous bypass surgery, and has always been bilateral arm pain associated with exertion. More recently in the last few weeks, he had been having left arm pain with exertion and relieved by rest. He said this was very much reminiscent of the previous angina. His case was discussed with the DOD and he was referred for cardiac catheterization.   Hospital Course     Consultants: None  He underwent LHC 08/20/16 with Dr. Burt Knack showing severe 3V disease with patent LIMA to LAD and SVG to 1st OM. Underwent PCI of in-stent restenosis of the mRCA with normal LVEDP. Plan for DAPT  with ASA/Plavix. Right radial cath site stable post procedure.Seen by cardiac rehab. Follow up has been arranged. Did have re-bleeding with TR band, seen by Dr. Burt Knack with bleeding controlled. No re-bleed once band removed. Did  have bruising up forearm but palpable pulse, no bruit noted. Plan for BMET on Thursday.  _____________  Discharge Vitals Blood pressure (!) 100/51, pulse 72, temperature 97.8 F (36.6 C), temperature source Oral, resp. rate 13, height '5\' 8"'$  (1.727 m), weight 132 lb (59.9 kg), SpO2 100 %.  Filed Weights   08/20/16 0539  Weight: 132 lb (59.9 kg)    Labs & Radiologic Studies    CBC No results for input(s): WBC, NEUTROABS, HGB, HCT, MCV, PLT in the last 72 hours. Basic Metabolic Panel  Recent Labs  08/20/16 0552  NA 142  K 4.6  CL 108  CO2 26  GLUCOSE 79  BUN 24*  CREATININE 1.27*  CALCIUM 9.9   Liver Function Tests No results for input(s): AST, ALT, ALKPHOS, BILITOT, PROT, ALBUMIN in the last 72 hours. No results for input(s): LIPASE, AMYLASE in the last 72 hours. Cardiac Enzymes No results for input(s): CKTOTAL, CKMB, CKMBINDEX, TROPONINI in the last 72 hours. BNP Invalid input(s): POCBNP D-Dimer No results for input(s): DDIMER in the last 72 hours. Hemoglobin A1C No results for input(s): HGBA1C in the last 72 hours. Fasting Lipid Panel No results for input(s): CHOL, HDL, LDLCALC, TRIG, CHOLHDL, LDLDIRECT in the last 72 hours. Thyroid Function Tests No results for input(s): TSH, T4TOTAL, T3FREE, THYROIDAB in the last 72 hours.  Invalid input(s): FREET3 _____________  No results found. Disposition   Pt is being discharged home today in good condition.  Follow-up Plans & Appointments    Follow-up Information    [facility], [place] Follow up on 08/27/2016.   Specialties:  Cardiology, Radiology Why:  at 8:30am for you follow up appt.  Contact information: [address] [place] [address] [phone]        [facility] Follow up on 08/23/2016.   Specialty:  Cardiology Why:  please come in for follow up labs Contact information: [address] [place] [phone]         Discharge  Instructions    Amb Referral to Cardiac Rehabilitation    Complete by:  As directed    Referring to [facility] Phase 2   Diagnosis:  Coronary Stents   Call MD for:  redness, tenderness, or signs of infection (pain, swelling, redness, odor or green/yellow discharge around incision site)    Complete by:  As directed    Diet - low sodium heart healthy    Complete by:  As directed    Discharge instructions    Complete by:  As directed    Radial Site Care Refer to this sheet in the next few weeks. These instructions provide you with information on caring for yourself after your procedure. Your caregiver may also give you more specific instructions. Your treatment has been planned according to current medical practices, but problems sometimes occur. Call your caregiver if you have any problems or questions after your procedure. HOME CARE INSTRUCTIONS You may shower the day after the procedure.Remove the bandage (dressing) and gently wash the site with plain soap and water.Gently pat the site dry.  Do not apply powder or lotion to the site.  Do not submerge the affected site in water for 3 to 5 days.  Inspect the site at least twice daily.  Do not flex or bend the affected arm for 24 hours.  No lifting over 5 pounds (2.3 kg) for 5 days after your procedure.  Do not drive home if you are discharged the same day of the procedure. Have someone else drive you.  You may drive 24 hours after the procedure unless otherwise instructed by your caregiver.  What to expect: Any bruising will usually fade within 1 to 2 weeks.  Blood that collects in the tissue (hematoma) may be painful to the touch. It should usually decrease in size and tenderness within 1 to 2 weeks.  SEEK IMMEDIATE MEDICAL CARE IF: You have unusual pain at the radial site.  You have redness, warmth, swelling, or pain at the radial site.  You have drainage (other than a small amount of blood on the dressing).  You have chills.  You have  a fever or persistent symptoms for more than 72 hours.  You have a fever and your symptoms suddenly get worse.  Your arm becomes pale, cool, tingly, or numb.  You have heavy bleeding from the site. Hold pressure on the site.   Increase activity slowly    Complete by:  As directed       Discharge Medications   Current Discharge Medication List    START taking these medications   Details  !! clopidogrel (PLAVIX) 75 MG tablet Take 1 tablet (75 mg total) by mouth daily. Qty: 30 tablet, Refills: 0         !! clopidogrel (PLAVIX) 75 MG tablet Take 1 tablet (75 mg total) by mouth daily with breakfast. Qty: 30 tablet, Refills: 6     !! - Potential duplicate medications found. Please discuss with provider.    CONTINUE these medications which have NOT CHANGED   Details  albuterol (PROVENTIL HFA;VENTOLIN HFA) 108 (90 BASE) MCG/ACT inhaler Inhale 2 puffs into the lungs every 6 (six) hours as needed for wheezing or shortness of breath.    amLODipine (NORVASC) 5 MG tablet Take 1 tablet (5 mg total) by mouth daily. Qty: 30 tablet, Refills: 3    aspirin 81 MG tablet Take 81 mg by mouth at bedtime.     Coenzyme Q10 (CO Q 10 PO) Take 1 tablet by mouth daily.    fluticasone (FLOVENT HFA) 110 MCG/ACT inhaler Inhale 1 puff into the lungs 2 (two) times daily.    furosemide (LASIX) 40 MG tablet Take 1 tablet (40 mg total) by mouth daily.    ipratropium (ATROVENT) 0.03 % nasal spray Place 1 spray into both nostrils 2 (two) times daily.  Refills: 12    ipratropium-albuterol (DUONEB) 0.5-2.5 (3) MG/3ML SOLN Take 3 mLs by nebulization 4 (four) times daily.     levothyroxine (SYNTHROID, LEVOTHROID) 75 MCG tablet Take 75 mcg by mouth daily before breakfast.    lisinopril (PRINIVIL,ZESTRIL) 20 MG tablet Take 1 tablet (20 mg total) by mouth daily. Qty: 30 tablet, Refills: 3    mometasone-formoterol (DULERA) 200-5 MCG/ACT AERO Inhale 2 puffs into the lungs 2 (two) times daily.    montelukast  (SINGULAIR) 10 MG tablet Take 10 mg by mouth at bedtime.    MUCINEX 600 MG 12 hr tablet Take 600 mg by mouth 2 (two) times daily. Refills: 0    predniSONE (DELTASONE) 10 MG tablet Take 10 mg by mouth daily with breakfast.          Outstanding Labs/Studies   BMET 4/12   Duration of Discharge Encounter   Greater than 30 minutes  including physician time.  Signed, Reino Bellis NP-C 08/20/2016, 4:02 PM

## 2016-08-20 NOTE — Interval H&P Note (Signed)
  Cath Lab Visit (complete for each Cath Lab visit)  Clinical Evaluation Leading to the Procedure:   ACS: No.  Non-ACS:    Anginal Classification: CCS III  Anti-ischemic medical therapy: Minimal Therapy (1 class of medications)  Non-Invasive Test Results: No non-invasive testing performed  Prior CABG: Previous CABG      History and Physical Interval Note:  08/20/2016 7:36 AM  Glenn Reilly  has presented today for surgery, with the diagnosis of cp  The various methods of treatment have been discussed with the patient and family. After consideration of risks, benefits and other options for treatment, the patient has consented to  Procedure(s): Left Heart Cath and Coronary Angiography (N/A) as a surgical intervention .  The patient's history has been reviewed, patient examined, no change in status, stable for surgery.  I have reviewed the patient's chart and labs.  Questions were answered to the patient's satisfaction.     Sherren Mocha

## 2016-08-20 NOTE — Discharge Instructions (Signed)
Radial Site Care °Refer to this sheet in the next few weeks. These instructions provide you with information about caring for yourself after your procedure. Your health care provider may also give you more specific instructions. Your treatment has been planned according to current medical practices, but problems sometimes occur. Call your health care provider if you have any problems or questions after your procedure. °What can I expect after the procedure? °After your procedure, it is typical to have the following: °· Bruising at the radial site that usually fades within 1-2 weeks. °· Blood collecting in the tissue (hematoma) that may be painful to the touch. It should usually decrease in size and tenderness within 1-2 weeks. °Follow these instructions at home: °· Take medicines only as directed by your health care provider. °· You may shower 24-48 hours after the procedure or as directed by your health care provider. Remove the bandage (dressing) and gently wash the site with plain soap and water. Pat the area dry with a clean towel. Do not rub the site, because this may cause bleeding. °· Do not take baths, swim, or use a hot tub until your health care provider approves. °· Check your insertion site every day for redness, swelling, or drainage. °· Do not apply powder or lotion to the site. °· Do not flex or bend the affected arm for 24 hours or as directed by your health care provider. °· Do not push or pull heavy objects with the affected arm for 24 hours or as directed by your health care provider. °· Do not lift over 10 lb (4.5 kg) for 5 days after your procedure or as directed by your health care provider. °· Ask your health care provider when it is okay to: °¨ Return to work or school. °¨ Resume usual physical activities or sports. °¨ Resume sexual activity. °· Do not drive home if you are discharged the same day as the procedure. Have someone else drive you. °· You may drive 24 hours after the procedure  unless otherwise instructed by your health care provider. °· Do not operate machinery or power tools for 24 hours after the procedure. °· If your procedure was done as an outpatient procedure, which means that you went home the same day as your procedure, a responsible adult should be with you for the first 24 hours after you arrive home. °· Keep all follow-up visits as directed by your health care provider. This is important. °Contact a health care provider if: °· You have a fever. °· You have chills. °· You have increased bleeding from the radial site. Hold pressure on the site. °Get help right away if: °· You have unusual pain at the radial site. °· You have redness, warmth, or swelling at the radial site. °· You have drainage (other than a small amount of blood on the dressing) from the radial site. °· The radial site is bleeding, and the bleeding does not stop after 30 minutes of holding steady pressure on the site. °· Your arm or hand becomes pale, cool, tingly, or numb. °This information is not intended to replace advice given to you by your health care provider. Make sure you discuss any questions you have with your health care provider. °Document Released: 06/02/2010 Document Revised: 10/06/2015 Document Reviewed: 11/16/2013 °Elsevier Interactive Patient Education © 2017 [employer] ° °

## 2016-08-21 ENCOUNTER — Encounter (HOSPITAL_COMMUNITY): Payer: Self-pay | Admitting: Cardiovascular Disease

## 2016-08-22 DIAGNOSIS — J4530 Mild persistent asthma, uncomplicated: Secondary | ICD-10-CM | POA: Diagnosis not present

## 2016-08-22 DIAGNOSIS — J40 Bronchitis, not specified as acute or chronic: Secondary | ICD-10-CM | POA: Diagnosis not present

## 2016-08-23 ENCOUNTER — Observation Stay (HOSPITAL_COMMUNITY): Payer: Medicare Other

## 2016-08-23 ENCOUNTER — Inpatient Hospital Stay (HOSPITAL_COMMUNITY)
Admission: AD | Admit: 2016-08-23 | Discharge: 2016-08-25 | DRG: 378 | Disposition: A | Discharge status: Home Health | Payer: Medicare Other | Source: Other Acute Inpatient Hospital | Attending: Internal Medicine | Admitting: Internal Medicine

## 2016-08-23 ENCOUNTER — Encounter (HOSPITAL_COMMUNITY): Payer: Self-pay | Admitting: General Practice

## 2016-08-23 DIAGNOSIS — E039 Hypothyroidism, unspecified: Secondary | ICD-10-CM | POA: Diagnosis present

## 2016-08-23 DIAGNOSIS — I701 Atherosclerosis of renal artery: Secondary | ICD-10-CM | POA: Diagnosis not present

## 2016-08-23 DIAGNOSIS — Z7982 Long term (current) use of aspirin: Secondary | ICD-10-CM

## 2016-08-23 DIAGNOSIS — I2510 Atherosclerotic heart disease of native coronary artery without angina pectoris: Secondary | ICD-10-CM | POA: Diagnosis present

## 2016-08-23 DIAGNOSIS — Z87891 Personal history of nicotine dependence: Secondary | ICD-10-CM | POA: Diagnosis not present

## 2016-08-23 DIAGNOSIS — I129 Hypertensive chronic kidney disease with stage 1 through stage 4 chronic kidney disease, or unspecified chronic kidney disease: Secondary | ICD-10-CM | POA: Diagnosis present

## 2016-08-23 DIAGNOSIS — C3412 Malignant neoplasm of upper lobe, left bronchus or lung: Secondary | ICD-10-CM | POA: Diagnosis present

## 2016-08-23 DIAGNOSIS — K219 Gastro-esophageal reflux disease without esophagitis: Secondary | ICD-10-CM | POA: Diagnosis not present

## 2016-08-23 DIAGNOSIS — Z7951 Long term (current) use of inhaled steroids: Secondary | ICD-10-CM | POA: Diagnosis not present

## 2016-08-23 DIAGNOSIS — R109 Unspecified abdominal pain: Secondary | ICD-10-CM | POA: Diagnosis not present

## 2016-08-23 DIAGNOSIS — N183 Chronic kidney disease, stage 3 (moderate): Secondary | ICD-10-CM | POA: Diagnosis not present

## 2016-08-23 DIAGNOSIS — Z951 Presence of aortocoronary bypass graft: Secondary | ICD-10-CM

## 2016-08-23 DIAGNOSIS — D6851 Activated protein C resistance: Secondary | ICD-10-CM | POA: Diagnosis not present

## 2016-08-23 DIAGNOSIS — K922 Gastrointestinal hemorrhage, unspecified: Secondary | ICD-10-CM

## 2016-08-23 DIAGNOSIS — Z7952 Long term (current) use of systemic steroids: Secondary | ICD-10-CM

## 2016-08-23 DIAGNOSIS — Z801 Family history of malignant neoplasm of trachea, bronchus and lung: Secondary | ICD-10-CM | POA: Diagnosis not present

## 2016-08-23 DIAGNOSIS — K921 Melena: Principal | ICD-10-CM

## 2016-08-23 DIAGNOSIS — Z800 Family history of malignant neoplasm of digestive organs: Secondary | ICD-10-CM

## 2016-08-23 DIAGNOSIS — Z85118 Personal history of other malignant neoplasm of bronchus and lung: Secondary | ICD-10-CM

## 2016-08-23 DIAGNOSIS — Z955 Presence of coronary angioplasty implant and graft: Secondary | ICD-10-CM

## 2016-08-23 DIAGNOSIS — E782 Mixed hyperlipidemia: Secondary | ICD-10-CM | POA: Diagnosis not present

## 2016-08-23 DIAGNOSIS — D500 Iron deficiency anemia secondary to blood loss (chronic): Secondary | ICD-10-CM | POA: Diagnosis not present

## 2016-08-23 DIAGNOSIS — J439 Emphysema, unspecified: Secondary | ICD-10-CM | POA: Diagnosis not present

## 2016-08-23 DIAGNOSIS — Z7902 Long term (current) use of antithrombotics/antiplatelets: Secondary | ICD-10-CM | POA: Diagnosis not present

## 2016-08-23 DIAGNOSIS — Z8249 Family history of ischemic heart disease and other diseases of the circulatory system: Secondary | ICD-10-CM | POA: Diagnosis not present

## 2016-08-23 DIAGNOSIS — K449 Diaphragmatic hernia without obstruction or gangrene: Secondary | ICD-10-CM | POA: Diagnosis present

## 2016-08-23 DIAGNOSIS — I739 Peripheral vascular disease, unspecified: Secondary | ICD-10-CM | POA: Diagnosis present

## 2016-08-23 DIAGNOSIS — Z885 Allergy status to narcotic agent status: Secondary | ICD-10-CM

## 2016-08-23 DIAGNOSIS — Z888 Allergy status to other drugs, medicaments and biological substances status: Secondary | ICD-10-CM

## 2016-08-23 DIAGNOSIS — Z923 Personal history of irradiation: Secondary | ICD-10-CM

## 2016-08-23 HISTORY — DX: Gastrointestinal hemorrhage, unspecified: K92.2

## 2016-08-23 LAB — CBC
HEMATOCRIT: 32.6 % — AB (ref 39.0–52.0)
HEMOGLOBIN: 10.4 g/dL — AB (ref 13.0–17.0)
MCH: 29.3 pg (ref 26.0–34.0)
MCHC: 31.9 g/dL (ref 30.0–36.0)
MCV: 91.8 fL (ref 78.0–100.0)
Platelets: 161 10*3/uL (ref 150–400)
RBC: 3.55 MIL/uL — AB (ref 4.22–5.81)
RDW: 16.6 % — ABNORMAL HIGH (ref 11.5–15.5)
WBC: 6.1 10*3/uL (ref 4.0–10.5)

## 2016-08-23 LAB — COMPREHENSIVE METABOLIC PANEL
ALBUMIN: 2.8 g/dL — AB (ref 3.5–5.0)
ALK PHOS: 44 U/L (ref 38–126)
ALT: 18 U/L (ref 17–63)
AST: 21 U/L (ref 15–41)
Anion gap: 5 (ref 5–15)
BUN: 24 mg/dL — AB (ref 6–20)
CO2: 26 mmol/L (ref 22–32)
CREATININE: 1.3 mg/dL — AB (ref 0.61–1.24)
Calcium: 9.3 mg/dL (ref 8.9–10.3)
Chloride: 112 mmol/L — ABNORMAL HIGH (ref 101–111)
GFR calc non Af Amer: 48 mL/min — ABNORMAL LOW (ref >60)
GFR, EST AFRICAN AMERICAN: 56 mL/min — AB (ref >60)
GLUCOSE: 81 mg/dL (ref 65–99)
Potassium: 4.3 mmol/L (ref 3.5–5.1)
SODIUM: 143 mmol/L (ref 135–145)
Total Bilirubin: 0.8 mg/dL (ref 0.3–1.2)
Total Protein: 4.7 g/dL — ABNORMAL LOW (ref 6.5–8.1)

## 2016-08-23 LAB — TROPONIN I
Troponin I: 0.03 ng/mL (ref <0.03)
Troponin I: 0.03 ng/mL (ref <0.03)

## 2016-08-23 LAB — TYPE AND SCREEN
ABO/RH(D): A POS
Antibody Screen: NEGATIVE

## 2016-08-23 MED ORDER — TRAZODONE HCL 50 MG PO TABS: 25.0000 mg | ORAL_TABLET | Freq: Every evening | ORAL | Status: DC | PRN

## 2016-08-23 MED ORDER — SODIUM CHLORIDE 0.9 % IV SOLN
INTRAVENOUS | Status: DC
  Administered 2016-08-23 – 2016-08-24 (×2): via INTRAVENOUS

## 2016-08-23 MED ORDER — MOMETASONE FURO-FORMOTEROL FUM 200-5 MCG/ACT IN AERO
2.0000 | INHALATION_SPRAY | Freq: Two times a day (BID) | RESPIRATORY_TRACT | Status: DC
  Administered 2016-08-23 – 2016-08-25 (×4): 2 via RESPIRATORY_TRACT
  Filled 2016-08-23: qty 8.8

## 2016-08-23 MED ORDER — HYDROCODONE-ACETAMINOPHEN 5-325 MG PO TABS
1.0000 | ORAL_TABLET | ORAL | Status: DC | PRN
Start: 2016-08-23 — End: 2016-08-25

## 2016-08-23 MED ORDER — BISACODYL 10 MG RE SUPP: 10.0000 mg | Freq: Every day | RECTAL | Status: DC | PRN

## 2016-08-23 MED ORDER — MONTELUKAST SODIUM 10 MG PO TABS
10.0000 mg | ORAL_TABLET | Freq: Every day | ORAL | Status: DC
  Administered 2016-08-23 – 2016-08-24 (×2): 10 mg via ORAL
  Filled 2016-08-23 (×2): qty 1

## 2016-08-23 MED ORDER — SENNOSIDES-DOCUSATE SODIUM 8.6-50 MG PO TABS: 1.0000 | ORAL_TABLET | Freq: Every evening | ORAL | Status: DC | PRN

## 2016-08-23 MED ORDER — PANTOPRAZOLE SODIUM 40 MG IV SOLR
40.0000 mg | Freq: Two times a day (BID) | INTRAVENOUS | Status: DC
  Administered 2016-08-23 – 2016-08-24 (×3): 40 mg via INTRAVENOUS
  Filled 2016-08-23 (×3): qty 40

## 2016-08-23 MED ORDER — GUAIFENESIN ER 600 MG PO TB12
600.0000 mg | ORAL_TABLET | Freq: Two times a day (BID) | ORAL | Status: DC
  Administered 2016-08-23: 600 mg via ORAL
  Filled 2016-08-23 (×3): qty 1

## 2016-08-23 MED ORDER — ONDANSETRON HCL 4 MG/2ML IJ SOLN: 4.0000 mg | Freq: Four times a day (QID) | INTRAMUSCULAR | Status: DC | PRN

## 2016-08-23 MED ORDER — NITROGLYCERIN 0.4 MG SL SUBL: 0.4000 mg | SUBLINGUAL_TABLET | SUBLINGUAL | Status: DC | PRN

## 2016-08-23 MED ORDER — ACETAMINOPHEN 325 MG PO TABS: 650.0000 mg | ORAL_TABLET | Freq: Four times a day (QID) | ORAL | Status: DC | PRN

## 2016-08-23 MED ORDER — LEVOTHYROXINE SODIUM 75 MCG PO TABS
75.0000 ug | ORAL_TABLET | Freq: Every day | ORAL | Status: DC
  Administered 2016-08-24 – 2016-08-25 (×2): 75 ug via ORAL
  Filled 2016-08-23 (×2): qty 1

## 2016-08-23 MED ORDER — IPRATROPIUM BROMIDE 0.06 % NA SOLN
1.0000 | Freq: Two times a day (BID) | NASAL | Status: DC
Start: 2016-08-23 — End: 2016-08-25
  Administered 2016-08-23 – 2016-08-24 (×2): 1 via NASAL
  Filled 2016-08-23: qty 30

## 2016-08-23 MED ORDER — LISINOPRIL 20 MG PO TABS
20.0000 mg | ORAL_TABLET | Freq: Every day | ORAL | Status: DC
  Administered 2016-08-24: 20 mg via ORAL
  Filled 2016-08-23: qty 1

## 2016-08-23 MED ORDER — ALBUTEROL SULFATE (2.5 MG/3ML) 0.083% IN NEBU
2.5000 mg | INHALATION_SOLUTION | Freq: Four times a day (QID) | RESPIRATORY_TRACT | Status: DC | PRN
Start: 2016-08-23 — End: 2016-08-25

## 2016-08-23 MED ORDER — AMLODIPINE BESYLATE 5 MG PO TABS
5.0000 mg | ORAL_TABLET | Freq: Every day | ORAL | Status: DC
  Administered 2016-08-24: 5 mg via ORAL
  Filled 2016-08-23: qty 1

## 2016-08-23 MED ORDER — KETOROLAC TROMETHAMINE 15 MG/ML IJ SOLN: 15.0000 mg | Freq: Four times a day (QID) | INTRAMUSCULAR | Status: DC | PRN

## 2016-08-23 MED ORDER — MAGNESIUM CITRATE PO SOLN: 1.0000 | Freq: Once | ORAL | Status: DC | PRN

## 2016-08-23 MED ORDER — ONDANSETRON HCL 4 MG PO TABS: 4.0000 mg | ORAL_TABLET | Freq: Four times a day (QID) | ORAL | Status: DC | PRN

## 2016-08-23 MED ORDER — ACETAMINOPHEN 650 MG RE SUPP: 650.0000 mg | Freq: Four times a day (QID) | RECTAL | Status: DC | PRN

## 2016-08-23 MED ORDER — PREDNISONE 10 MG PO TABS
10.0000 mg | ORAL_TABLET | Freq: Every day | ORAL | Status: DC
  Administered 2016-08-24 – 2016-08-25 (×2): 10 mg via ORAL
  Filled 2016-08-23 (×2): qty 1

## 2016-08-23 MED ORDER — FUROSEMIDE 40 MG PO TABS
40.0000 mg | ORAL_TABLET | Freq: Every day | ORAL | Status: DC
  Administered 2016-08-24: 40 mg via ORAL
  Filled 2016-08-23: qty 1

## 2016-08-23 MED ORDER — CLOPIDOGREL BISULFATE 75 MG PO TABS
75.0000 mg | ORAL_TABLET | Freq: Every day | ORAL | Status: DC
  Administered 2016-08-23 – 2016-08-24 (×2): 75 mg via ORAL
  Filled 2016-08-23 (×2): qty 1

## 2016-08-23 MED ORDER — SODIUM CHLORIDE 0.9% FLUSH
3.0000 mL | Freq: Two times a day (BID) | INTRAVENOUS | Status: DC
Start: 2016-08-23 — End: 2016-08-25
  Administered 2016-08-23 – 2016-08-24 (×4): 3 mL via INTRAVENOUS

## 2016-08-23 NOTE — Progress Notes (Signed)
Spoke to Dr. Acie Fredrickson with cardiology who advises cont plavix. Risk to high to warrant stopping medication unless patient is sicker.  Elwin Mocha

## 2016-08-23 NOTE — Progress Notes (Signed)
Call received from the lab of a critical lab value troponin of 0.03. MD Aggie Moats notified.

## 2016-08-23 NOTE — Consult Note (Signed)
[facility] Consult: 2:04 PM 08/23/2016  LOS: 0 days    Referring Provider: Dr Aggie Moats  Primary Care Physician:  Townsend Roger, MD in [facility] Primary Gastroenterologist:  Althia Forts.  Dr. Lyndel Safe in [place] but hasn't seen him for many years.    Reason for Consultation:  GI bleed, melena.  Blood loss anemia.   HPI: Glenn Reilly is a 83 y.o. male.   PMH of factor V Leiden mutation, heterozygous.  Hypothyroidism. CAD s/p CABG 11/20/2013.  PAD s/p bilateral iliac stenting remotely.  Carotid artery disease.  CVA 2011. HTN.  HLD.  Hx squamous NSC lung cancer, s/p 2001 RLL lobectomy.  Second lung cancer 2015 (squamous cell) treated with radiation to LUL.  CT chest in 05/2016 worrisome for cancer recurrence, oncologist is following with serial scans: no therapy thus far. COPD. Asbestosis  s/p remote colonoscopy. Patient doesn't recall any diagnoses of polyps or diverticulosis. S/PE upper endoscopy. Patient recalls a diagnosis of hiatal hernia and GERD   For evaluation of new onset chest pain.  S/P cardiac cath 08/20/16 with 6 vessel PCI of in-stent RCA stenosis.  Dual antiplatelet therapy for 12 months, interruption of therapy after 3 months if necessary.  Has had 3 doses of Plavix so far.    ~ 9 PM last night passed 1st of 2 black, soft stools and had crampy abdominal pain.  Pain eased. He took some Zantac which he had at home and doesn't normally use thinking maybe it would help. Recurrent pain and 3 more black stools starting at 4 AM today.  Felt dizzy.  No chest pain.  No SOB.   Has periodic nose bleeds, had one on 4/10. Had a lot of bleeding at catheter access site on left wrist after the cath.[facility] to [facility] ED.  Hgb 10.5 .  Was 12.8 on 08/15/16, pre cath.  BUN was moderately elevated at 29.  Not hypotensive or  tachycardic.   Past Medical History:  Diagnosis Date  . Asbestosis ([place])   . Asthma   . CAD, [place] PCI 2004 Cath 11/05/2013 with 50% LMain, 75% prox LAD, 80% prox LCx, 50% RCA with normal EF  08/20/16 Cath with PCI DES--> RCA  . Carotid artery disease ([place])   . Cataract   . Complication of anesthesia    lt side throat parlysis  . COPD (chronic obstructive pulmonary disease) ([place])   . COPD with emphysema ([place]) 01/13/2010   Spirometry 10/19/2013 is reviewed. This reveals FEV1 44% predicted FVC 47% predicted and FEF 25 7537% predicted   . Detached retina   . Dysphagia as late effect of stroke    Developed as complication of stroke following left carotid endarterectomy with documented left hypoglossal nerve paralysis   . Factor 5 Leiden mutation, heterozygous ([place])   . 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 HTN (hypertension)   .  Hyperlipemia   . HYPERLIPIDEMIA-MIXED 01/13/2010   Qualifier: Diagnosis of  By: Olevia Perches, MD, Glenetta Hew   . HYPERTENSION, BENIGN 01/13/2010   Qualifier: Diagnosis of  By: Olevia Perches, MD, Glenetta Hew   . Hypothyroidism   . Lung cancer ([place]) 2001   lower right lobe surgery to remove cancer, no chemo or radiation  . Lung mass 10/19/2013   CT of the chest 10/12/2013 reveals an enlarging left upper lobe irregular nodule. Now 1.4 compared to 1.0 cm in diameter when compared to March 2015 CT scan. There is calcified pleural plaques seen bilaterally. There is subpleural scarring seen as well. Previous right lower lobectomy has been demonstrated.  PET-CT scan from June 2015 is reviewed this shows increased uptake (SUV > 8) in left upper   . PVD 01/13/2010   Qualifier: Diagnosis of  By: Olevia Perches, MD, Glenetta Hew   . PVD (peripheral vascular disease) ([place]) 2011   carotid endarterectomy, pv  stents  . Radiation 9/1, 9/3, 01/19/14   Left upper lung 3 fractions  . S/P CABG x 2 11/20/2013   Off-pump CABG x2 using LIMA to LAD, SVG to OM1, EVH via right thigh  . Stroke [facility]) 07/2009   left carotid endarterectomy    Past Surgical History:  Procedure Laterality Date  . APPENDECTOMY    . BIOPSY Left 11/20/2013   Procedure: BIOPSY Left Upper Lobe;  Surgeon: Rexene Alberts, MD;  Location: [place];  Service: Open Heart Surgery;  Laterality: Left;  . CAROTID ENDARTERECTOMY Left 2011   [facility] - complicated by post-op stroke  . CORONARY ARTERY BYPASS GRAFT N/A 11/20/2013   Procedure: OFF PUMP CORONARY ARTERY BYPASS GRAFTING (CABG) x2: LIMA to LAD, SVG to Obtuse Marginal 1.;  Surgeon: Rexene Alberts, MD;  Location: [place];  Service: Open Heart Surgery;  Laterality: N/A;  . CORONARY STENT INTERVENTION N/A 08/20/2016   Procedure: Coronary Stent Intervention;  Surgeon: Sherren Mocha, MD;  Location: [place] CV LAB;  Service: Cardiovascular;  Laterality: N/A;  . FRACTURE SURGERY Left    foot  . HEMORRHOID SURGERY    . ILIAC ARTERY STENT Bilateral 2011   [facility] - performed for asymptomatic iliac artery disease  . INTRAOPERATIVE TRANSESOPHAGEAL ECHOCARDIOGRAM N/A 11/20/2013   Procedure: INTRAOPERATIVE TRANSESOPHAGEAL ECHOCARDIOGRAM;  Surgeon: Rexene Alberts, MD;  Location: [place];  Service: Open Heart Surgery;  Laterality: N/A;  . LEFT HEART CATH AND CORONARY ANGIOGRAPHY N/A 08/20/2016   Procedure: Left Heart Cath and Coronary Angiography;  Surgeon: Sherren Mocha, MD;  Location: [place] CV LAB;  Service: Cardiovascular;  Laterality: N/A;  . LEFT HEART CATHETERIZATION WITH CORONARY ANGIOGRAM N/A 11/05/2013   Procedure: LEFT HEART CATHETERIZATION WITH CORONARY ANGIOGRAM;  Surgeon: Lorretta Harp, MD;  Location: [facility] CATH LAB;  Service: Cardiovascular;  Laterality: N/A;  . LOBECTOMY Right 06/09/1999   Right Lower Lobectomy for T1N0M0 stage IA large cell undifferentiated  carcinoma of the lung  . PERCUTANEOUS CORONARY STENT INTERVENTION (PCI-S)  1999   RCA  . PROSTATE SURGERY     TURP  . VIDEO ASSISTED THORACOSCOPY (VATS)/EMPYEMA Right 07/13/1999   drainage of post-operative empyema following RLLobectomy    Prior to Admission medications   Medication Sig Start Date End Date Taking? Authorizing Provider  albuterol (PROVENTIL HFA;VENTOLIN HFA) 108 (90 BASE) MCG/ACT inhaler Inhale 2 puffs into the lungs every 6 (six) hours as needed for wheezing or shortness of breath.    Historical Provider, MD  amLODipine ([place])  5 MG tablet Take 1 tablet (5 mg total) by mouth daily. 08/15/16 11/13/16  Lorretta Harp, MD  aspirin 81 MG tablet Take 81 mg by mouth at bedtime.     Historical Provider, MD  clopidogrel (PLAVIX) 75 MG tablet Take 1 tablet (75 mg total) by mouth daily. 08/20/16   Cheryln Manly, NP  clopidogrel (PLAVIX) 75 MG tablet Take 1 tablet (75 mg total) by mouth daily with breakfast. 08/21/16   Cheryln Manly, NP  Coenzyme Q10 (CO Q 10 PO) Take 1 tablet by mouth daily.    Historical Provider, MD  fluticasone (FLOVENT HFA) 110 MCG/ACT inhaler Inhale 1 puff into the lungs 2 (two) times daily.    Historical Provider, MD  furosemide (LASIX) 40 MG tablet Take 1 tablet (40 mg total) by mouth daily. 08/15/16   Almyra Deforest, PA  ipratropium (ATROVENT) 0.03 % nasal spray Place 1 spray into both nostrils 2 (two) times daily.  07/03/14   Historical Provider, MD  ipratropium-albuterol (DUONEB) 0.5-2.5 (3) MG/3ML SOLN Take 3 mLs by nebulization 4 (four) times daily.     Historical Provider, MD  levothyroxine (SYNTHROID, LEVOTHROID) 75 MCG tablet Take 75 mcg by mouth daily before breakfast.    Historical Provider, MD  lisinopril (PRINIVIL,ZESTRIL) 20 MG tablet Take 1 tablet (20 mg total) by mouth daily. 08/15/16   Lorretta Harp, MD  mometasone-formoterol (DULERA) 200-5 MCG/ACT AERO Inhale 2 puffs into the lungs 2 (two) times daily.    Historical Provider, MD  montelukast  (SINGULAIR) 10 MG tablet Take 10 mg by mouth at bedtime. 07/06/16   Historical Provider, MD  MUCINEX 600 MG 12 hr tablet Take 600 mg by mouth 2 (two) times daily. 08/07/16   Historical Provider, MD  predniSONE (DELTASONE) 10 MG tablet Take 10 mg by mouth daily with breakfast.    Historical Provider, MD    Scheduled Meds:  Infusions:  PRN Meds:    Allergies as of 08/23/2016 - Review Complete 08/20/2016  Allergen Reaction Noted  . Demerol [meperidine] Anaphylaxis 03/02/2013  . Statins Other (See Comments) 07/16/2014    Family History  Problem Relation Age of Onset  . Heart attack Mother   . Heart attack Father   . Cancer Brother     esophagus and lung  . Cancer Brother     lung    Social History   Social History  . Marital status: Widowed    Spouse name: N/A  . Number of children: 1  . Years of education: N/A   Occupational History  . 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 Smoking status: Former Smoker    Packs/day: 1.00    Years: 30.00    Types: Cigarettes    Quit date: 03/03/1979  . Smokeless tobacco: Current User    Types: Chew  . Alcohol use No  . Drug use: No  . Sexual activity: Not on file   Other Topics Concern  . Not on file   Social History Narrative  . No narrative on file    REVIEW OF SYSTEMS: Constitutional:  Dizziness.  20 # weight loss over last several months despite good appetite an po intake.   ENT:  Per HPI Pulm:  No SOB or cough CV:  No palpitations, no LE edema. No chest pain GU:  No hematuria, no frequency GI:  Per HPI Heme:  Per HPI   Transfusions:  Remotely.   Neuro:  No headaches, no peripheral tingling or numbness Derm:  No itching, no rash or sores.  Endocrine:  No sweats or chills.  No polyuria or dysuria Immunization:  Not queried.  Travel:  None beyond local counties in last few months.    PHYSICAL EXAM: Vital signs in last 24 hours: Vitals:   08/23/16 1302  BP: (!)  135/53  Pulse: 75  Resp: 16  Temp: 98 F (36.7 C)   Wt Readings from Last 3 Encounters:  08/23/16 59.4 kg (131 lb)  08/20/16 59.9 kg (132 lb)  08/15/16 59.9 kg (132 lb)    General: Very pleasant elderly gentleman who looks his stated age. He is not particularly frail in appearance. Comfortable. Head:  No facial asymmetry. No signs of head trauma.  Eyes:  No scleral icterus, no conjunctival pallor. Ears:  Hearing intact  Nose:  No congestion or discharge. No dried blood. Mouth:  Full dentures. Oral mucosa is pink, moist, clear. Tongue midline. Neck:  No mass, no JVD, no thyromegaly. Lungs:  Overall diminished breath sounds. No adventitious sounds. No dyspnea or cough. Heart: RRR. No MRG. S1, S2 present. Abdomen:  Not tender or distended. Soft. No HSM or masses..   Rectal: Did not repeat rectal exam. In the emergency room earlier today stool was black and FOBT positive.   Musc/Skeltl: No joint erythema, swelling or significant deformities. Extremities:  No CCE.  Neurologic:  Fully alert. Good historian. No limb weakness. No tremors. Skin:  Extensive bruising on both arms consistent with recent interventions and phlebotomy. Tattoos:  None observed. Nodes:  No cervical adenopathy.   Psych:  Wasn't, cooperative. Calm  Intake/Output from previous day: No intake/output data recorded. Intake/Output this shift: No intake/output data recorded.  LAB RESULTS: No results for input(s): WBC, HGB, HCT, PLT in the last 72 hours. BMET Lab Results  Component Value Date   NA 142 08/20/2016   NA 142 08/15/2016   NA 146 02/01/2015   K 4.6 08/20/2016   K 4.3 08/15/2016   K 4.2 02/01/2015   CL 108 08/20/2016   CL 106 08/15/2016   CL 103 02/01/2015   CO2 26 08/20/2016   CO2 25 08/15/2016   CO2 33 (H) 02/01/2015   GLUCOSE 79 08/20/2016   GLUCOSE 87 08/15/2016   GLUCOSE 109 (H) 02/01/2015   BUN 24 (H) 08/20/2016   BUN 41 (H) 08/15/2016   BUN 27 (H) 02/01/2015   CREATININE 1.27 (H)  08/20/2016   CREATININE 1.64 (H) 08/15/2016   CREATININE 1.20 (H) 02/01/2015   CALCIUM 9.9 08/20/2016   CALCIUM 10.0 08/15/2016   CALCIUM 9.7 02/01/2015   LFT No results for input(s): PROT, ALBUMIN, AST, ALT, ALKPHOS, BILITOT, BILIDIR, IBILI in the last 72 hours. PT/INR Lab Results  Component Value Date   INR 1.1 08/15/2016   INR 1.59 (H) 11/20/2013   INR 1.24 11/17/2013   Hepatitis Panel No results for input(s): HEPBSAG, HCVAB, HEPAIGM, HEPBIGM in the last 72 hours. C-Diff No components found for: CDIFF Lipase  No results found for: LIPASE  Drugs of Abuse  No results found for: LABOPIA, COCAINSCRNUR, LABBENZ, AMPHETMU, THCU, LABBARB   RADIOLOGY STUDIES: No results found.   IMPRESSION:   *  GI bleed. Suspect upper GI bleed. Rule out ulcer, rule out telangiectasia, rule out neoplasia. Recent nose bleed on 4/10 that may have added to blood loss but doubt this caused his sxs and black stools last night. Blood loss from cath site on left wrist has also adde  to anemia.  Hx HH and GERD, not on PPI recently.   *  Recent initiation of Plavix 3 days ago following cardiac cath and stenting.  *  FactorV Leiden mutation, heterozygous.   *  Lung cancer.  [place] RUL lobectomy 2001.  Radiation for LUL squamous cell cancer 2015.  Recurrence of LUL cancer this 05/2016, being followed by oncology/pulmonary Dr Hinton Rao.       Azucena Freed  08/23/2016, 2:04 PM Pager: [phone]    ________________________________________________________________________  Velora Heckler GI MD note:  I personally examined the patient, reviewed the data and agree with the assessment and plan described above.  He has had UGI bleeding but given very fresh coronary stenting I recommend PPI twice daily (already started IV BID) and observation rather than proceeding straight to invasive testing.  Can restart plavix tomorrow.  If he has obvious overt rebleeding then EGD.  Trend CBC.  Ok to resume heart healthy  diet.   Owens Loffler, MD [facility] Pager [phone]

## 2016-08-23 NOTE — H&P (Signed)
History and Physical    Glenn Reilly XBD:[id number] DOB: [birth year] DOA: 08/23/2016   PCP: Townsend Roger, MD   Patient coming from:  Home    Chief Complaint: GI Bleed   HPI: JOSPH Reilly is a 83 y.o. male with extensive medical history listed below,  transferred from [facility] for evaluation of melanotic stools. In review, patient had a recent catheterization at [facility] for 3 V disease with total occlusion of the pLAD, requiring dual antiplatelet therapy for 12 months with Plavix. However, he was only able to take 2 doses, as he began to experience multiple melanotic stools today. He denies an history of severe anemia, or history of GIB. He denies  epistaxis, hematemesis, hematuria. Appetite is normal. He reports diffuse abdominal discomfort . Denies fevers, chills, night sweats, vision changes, or mucositis. Denies any respiratory complaints. Denies any chest pain or palpitations at this time . Denies lower extremity swelling. Denies nausea, Denies abnormal skin rashes, or neuropathy. Plavix was held and transfer was arranged due to no GI coverage at the facility.   ED Course:  BP (!) 135/53 (BP Location: Left Arm)   Pulse 75   Temp 98 F (36.7 C) (Oral)   Resp 16   Ht '5\' 8"'$  (1.727 m)   Wt 59.4 kg (131 lb)   SpO2 100%   BMI 19.92 kg/m    Started Protonix drip at [facility] was 10.5 (13 on Monday)   Review of Systems: As per HPI otherwise 10 point review of systems negative.   Past Medical History:  Diagnosis Date  . Asbestosis ([place])   . Asthma   . CAD, [place] PCI 2004 Cath 11/05/2013 with 50% LMain, 75% prox LAD, 80% prox LCx, 50% RCA with normal EF  08/20/16 Cath with PCI DES--> RCA  . Carotid artery disease ([place])   . Cataract   . Complication of anesthesia    lt side throat parlysis  . COPD (chronic obstructive pulmonary disease) ([place])   . COPD with emphysema ([place]) 01/13/2010   Spirometry 10/19/2013 is reviewed. This  reveals FEV1 44% predicted FVC 47% predicted and FEF 25 7537% predicted   . Detached retina   . Dysphagia as late effect of stroke    Developed as complication of stroke following left carotid endarterectomy with documented left hypoglossal nerve paralysis   . Factor 5 Leiden mutation, heterozygous ([place])   . 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 HTN (hypertension)   . Hyperlipemia   . HYPERLIPIDEMIA-MIXED 01/13/2010   Qualifier: Diagnosis of  By: Olevia Perches, MD, Glenetta Hew   . HYPERTENSION, BENIGN 01/13/2010   Qualifier: Diagnosis of  By: Olevia Perches, MD, Glenetta Hew   . Hypothyroidism   . Lung cancer ([place]) 2001   lower right lobe surgery to remove cancer, no chemo or radiation  . Lung mass 10/19/2013   CT of the chest 10/12/2013 reveals an enlarging left upper lobe irregular nodule. Now 1.4 compared to 1.0 cm in diameter when compared to March 2015 CT scan. There is calcified pleural plaques seen bilaterally. There is subpleural scarring seen as well. Previous right lower lobectomy has been demonstrated.  PET-CT scan from June 2015 is reviewed this shows increased uptake (SUV > 8) in left upper   .  PVD 01/13/2010   Qualifier: Diagnosis of  By: Olevia Perches, MD, Glenetta Hew   . PVD (peripheral vascular disease) ([place]) 2011   carotid endarterectomy, pv stents  . Radiation 9/1, 9/3, 01/19/14   Left upper lung 3 fractions  . S/P CABG x 2 11/20/2013   Off-pump CABG x2 using LIMA to LAD, SVG to OM1, EVH via right thigh  . Stroke [facility]) 07/2009   left carotid endarterectomy    Past Surgical History:  Procedure Laterality Date  . APPENDECTOMY    . BIOPSY Left 11/20/2013   Procedure: BIOPSY Left Upper Lobe;  Surgeon: Rexene Alberts, MD;  Location: [place];  Service: Open Heart Surgery;  Laterality: Left;  . CAROTID ENDARTERECTOMY Left 2011    [facility] - complicated by post-op stroke  . CORONARY ARTERY BYPASS GRAFT N/A 11/20/2013   Procedure: OFF PUMP CORONARY ARTERY BYPASS GRAFTING (CABG) x2: LIMA to LAD, SVG to Obtuse Marginal 1.;  Surgeon: Rexene Alberts, MD;  Location: [place];  Service: Open Heart Surgery;  Laterality: N/A;  . CORONARY STENT INTERVENTION N/A 08/20/2016   Procedure: Coronary Stent Intervention;  Surgeon: Sherren Mocha, MD;  Location: [place] CV LAB;  Service: Cardiovascular;  Laterality: N/A;  . FRACTURE SURGERY Left    foot  . HEMORRHOID SURGERY    . ILIAC ARTERY STENT Bilateral 2011   [facility] - performed for asymptomatic iliac artery disease  . INTRAOPERATIVE TRANSESOPHAGEAL ECHOCARDIOGRAM N/A 11/20/2013   Procedure: INTRAOPERATIVE TRANSESOPHAGEAL ECHOCARDIOGRAM;  Surgeon: Rexene Alberts, MD;  Location: [place];  Service: Open Heart Surgery;  Laterality: N/A;  . LEFT HEART CATH AND CORONARY ANGIOGRAPHY N/A 08/20/2016   Procedure: Left Heart Cath and Coronary Angiography;  Surgeon: Sherren Mocha, MD;  Location: [place] CV LAB;  Service: Cardiovascular;  Laterality: N/A;  . LEFT HEART CATHETERIZATION WITH CORONARY ANGIOGRAM N/A 11/05/2013   Procedure: LEFT HEART CATHETERIZATION WITH CORONARY ANGIOGRAM;  Surgeon: Lorretta Harp, MD;  Location: [facility] CATH LAB;  Service: Cardiovascular;  Laterality: N/A;  . 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 PERCUTANEOUS CORONARY STENT INTERVENTION (PCI-S)  1999   RCA  . PROSTATE SURGERY     TURP  . VIDEO ASSISTED THORACOSCOPY (VATS)/EMPYEMA Right 07/13/1999   drainage of post-operative empyema following RLLobectomy    Social History Social History   Social History  . Marital status: Widowed    Spouse name: N/A  . Number of children: 1  . Years of education: N/A   Occupational History  . 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 Smoking status: Former Smoker    Packs/day: 1.00    Years: 30.00    Types: Cigarettes    Quit date: 03/03/1979  . Smokeless tobacco: Current User    Types: Chew  . Alcohol use No  . Drug use: No  . Sexual activity: Not on file   Other Topics Concern  . Not on file   Social History Narrative  . No narrative on file     Allergies  Allergen Reactions  . Demerol [Meperidine] Anaphylaxis  . Statins Other (See Comments)    Myalgia     Family History  Problem Relation Age of Onset  . Heart attack Mother   . Heart attack Father   . Cancer Brother     esophagus and lung  .  Cancer Brother     lung      Prior to Admission medications   Medication Sig Start Date End Date Taking? Authorizing Provider  albuterol (PROVENTIL HFA;VENTOLIN HFA) 108 (90 BASE) MCG/ACT inhaler Inhale 2 puffs into the lungs every 6 (six) hours as needed for wheezing or shortness of breath.    Historical Provider, MD  amLODipine (NORVASC) 5 MG tablet Take 1 tablet (5 mg total) by mouth daily. 08/15/16 11/13/16  Lorretta Harp, MD  aspirin 81 MG tablet Take 81 mg by mouth at bedtime.     Historical Provider, MD  clopidogrel (PLAVIX) 75 MG tablet Take 1 tablet (75 mg total) by mouth daily. 08/20/16   Cheryln Manly, NP  clopidogrel (PLAVIX) 75 MG tablet Take 1 tablet (75 mg total) by mouth daily with breakfast. 08/21/16   Cheryln Manly, NP  Coenzyme Q10 (CO Q 10 PO) Take 1 tablet by mouth daily.    Historical Provider, MD  fluticasone (FLOVENT HFA) 110 MCG/ACT inhaler Inhale 1 puff into the lungs 2 (two) times daily.    Historical Provider, MD  furosemide (LASIX) 40 MG tablet Take 1 tablet (40 mg total) by mouth daily. 08/15/16   Almyra Deforest, PA  ipratropium (ATROVENT) 0.03 % nasal spray Place 1 spray into both nostrils 2 (two) times daily.  07/03/14   Historical Provider, MD  ipratropium-albuterol (DUONEB) 0.5-2.5 (3) MG/3ML SOLN Take 3 mLs by nebulization 4 (four) times daily.     Historical  Provider, MD  levothyroxine (SYNTHROID, LEVOTHROID) 75 MCG tablet Take 75 mcg by mouth daily before breakfast.    Historical Provider, MD  lisinopril (PRINIVIL,ZESTRIL) 20 MG tablet Take 1 tablet (20 mg total) by mouth daily. 08/15/16   Lorretta Harp, MD  mometasone-formoterol (DULERA) 200-5 MCG/ACT AERO Inhale 2 puffs into the lungs 2 (two) times daily.    Historical Provider, MD  montelukast (SINGULAIR) 10 MG tablet Take 10 mg by mouth at bedtime. 07/06/16   Historical Provider, MD  MUCINEX 600 MG 12 hr tablet Take 600 mg by mouth 2 (two) times daily. 08/07/16   Historical Provider, MD  predniSONE (DELTASONE) 10 MG tablet Take 10 mg by mouth daily with breakfast.    Historical Provider, MD    Physical Exam:  Vitals:   08/23/16 1302  BP: (!) 135/53  Pulse: 75  Resp: 16  Temp: 98 F (36.7 C)  TempSrc: Oral  SpO2: 100%  Weight: 59.4 kg (131 lb)  Height: '5\' 8"'$  (1.727 m)   Constitutional: NAD, calm, comfortable  Eyes: PERRL, lids and conjunctivae normal ENMT: Mucous membranes are moist, without exudate or lesions  Neck: normal, supple, no masses, no thyromegaly Respiratory: clear to auscultation bilaterally, no wheezing, no crackles. Normal respiratory effort  Cardiovascular: Regular rate and rhythm, no murmurs, rubs or gallops. No extremity edema. 2+ pedal pulses. No carotid bruits.  Abdomen: Soft, diffuse mild  Tenderness on palpation , No hepatosplenomegaly. Bowel sounds positive.  Musculoskeletal: no clubbing / cyanosis. Multiple areas of echymosesMoves all extremities Skin: no jaundice, No lesions. Very dry  Neurologic: Sensation intact  Strength equal in all extremities, tongue deviated to R  Psychiatric:   Alert and oriented x 3. Normal mood.     Labs on Admission: I have personally reviewed following labs and imaging studies  CBC: No results for input(s): WBC, NEUTROABS, HGB, HCT, MCV, PLT in the last 168 hours.  Basic Metabolic Panel:  Recent Labs Lab 08/20/16 0552    NA 142  K 4.6  CL 108  CO2 26  GLUCOSE 79  BUN 24*  CREATININE 1.27*  CALCIUM 9.9    GFR: Estimated Creatinine Clearance: 35.7 mL/min (A) (by C-G formula based on SCr of 1.27 mg/dL (H)).  Liver Function Tests: No results for input(s): AST, ALT, ALKPHOS, BILITOT, PROT, ALBUMIN in the last 168 hours. No results for input(s): LIPASE, AMYLASE in the last 168 hours. No results for input(s): AMMONIA in the last 168 hours.  Coagulation Profile: No results for input(s): INR, PROTIME in the last 168 hours.  Cardiac Enzymes: No results for input(s): CKTOTAL, CKMB, CKMBINDEX, TROPONINI in the last 168 hours.  BNP (last 3 results) No results for input(s): PROBNP in the last 8760 hours.  HbA1C: No results for input(s): HGBA1C in the last 72 hours.  CBG: No results for input(s): GLUCAP in the last 168 hours.  Lipid Profile: No results for input(s): CHOL, HDL, LDLCALC, TRIG, CHOLHDL, LDLDIRECT in the last 72 hours.  Thyroid Function Tests: No results for input(s): TSH, T4TOTAL, FREET4, T3FREE, THYROIDAB in the last 72 hours.  Anemia Panel: No results for input(s): VITAMINB12, FOLATE, FERRITIN, TIBC, IRON, RETICCTPCT in the last 72 hours.  Urine analysis:    Component Value Date/Time   COLORURINE YELLOW 11/17/2013 [place] 11/17/2013 1604   LABSPEC 1.017 11/17/2013 1604   PHURINE 6.0 11/17/2013 1604   GLUCOSEU NEGATIVE 11/17/2013 1604   HGBUR NEGATIVE 11/17/2013 1604   BILIRUBINUR NEGATIVE 11/17/2013 [place] 11/17/2013 1604   PROTEINUR NEGATIVE 11/17/2013 1604   UROBILINOGEN 0.2 11/17/2013 1604   NITRITE NEGATIVE 11/17/2013 1604   LEUKOCYTESUR NEGATIVE 11/17/2013 1604    Sepsis Labs: '@LABRCNTIP'$ (procalcitonin:4,lacticidven:4) )No results found for this or any previous visit (from the past 240 hour(s)).   Radiological Exams on Admission: No results found.  EKG: Independently reviewed.  Assessment/Plan Active Problems:   GIB  (gastrointestinal bleeding)   Lower GI bleed  Likely due to Plavix Hb 10.5, was 13  BUN  24   Admit to telemetry Cycle CBC every 6 hours  Check PT/INR/PTT Type and screen complete  NPO, advance to Clear liquids only Normal saline IV fluid  Protonix '40mg'$  IV per  GI eval Hold all NSAIDs including preadmission aspirin GI consult   CAD s/p cardiac cath with stent on 4/9 , d/c's on PLavix, now with Plavix on hold due to GIB,   patient is cardiac pain free at this time. Will notify Dr. Gwenlyn Found of the patient's admission. Any therapeutic changes as per Cards discretion   Chronic kidney disease stage 3   Current Cr 1.27, at baseline  Lab Results  Component Value Date   CREATININE 1.27 (H) 08/20/2016   CREATININE 1.64 (H) 08/15/2016   CREATININE 1.20 (H) 02/01/2015   IVF Repeat CMET in am  History of lung cancer/COPD without exacerbation  Continue home meds   Hypothyroidism: Continue home Synthroid  History of F V leiden mutation/ History of stroke  . Patient was on ASA and PLavix, which are on hold for today due to GIB. Currently on SCDs. Monitor very closely   DVT prophylaxis:  SCD's    Code Status:   Full  Family Communication:  Discussed with patient Disposition Plan: Expect patient to be discharged to home after condition improves Consults called:   GI  Gerre Couch) . Informed Dr. Gwenlyn Found (Cards) of the patient's admission  Admission status:Tele Obs    Rondel Jumbo, PA-C [facility]   08/23/2016, 2:29 PM

## 2016-08-23 NOTE — Progress Notes (Addendum)
Pt admitted to room 5W16 from [facility] in stable condition. Family at bedside.   Per [facility] admissions, NP has been paged for orders.

## 2016-08-24 DIAGNOSIS — I129 Hypertensive chronic kidney disease with stage 1 through stage 4 chronic kidney disease, or unspecified chronic kidney disease: Secondary | ICD-10-CM | POA: Diagnosis present

## 2016-08-24 DIAGNOSIS — Z85118 Personal history of other malignant neoplasm of bronchus and lung: Secondary | ICD-10-CM | POA: Diagnosis not present

## 2016-08-24 DIAGNOSIS — Z955 Presence of coronary angioplasty implant and graft: Secondary | ICD-10-CM | POA: Diagnosis not present

## 2016-08-24 DIAGNOSIS — K921 Melena: Secondary | ICD-10-CM | POA: Diagnosis not present

## 2016-08-24 DIAGNOSIS — K219 Gastro-esophageal reflux disease without esophagitis: Secondary | ICD-10-CM | POA: Diagnosis present

## 2016-08-24 DIAGNOSIS — Z801 Family history of malignant neoplasm of trachea, bronchus and lung: Secondary | ICD-10-CM | POA: Diagnosis not present

## 2016-08-24 DIAGNOSIS — Z7951 Long term (current) use of inhaled steroids: Secondary | ICD-10-CM | POA: Diagnosis not present

## 2016-08-24 DIAGNOSIS — K254 Chronic or unspecified gastric ulcer with hemorrhage: Secondary | ICD-10-CM | POA: Diagnosis not present

## 2016-08-24 DIAGNOSIS — K449 Diaphragmatic hernia without obstruction or gangrene: Secondary | ICD-10-CM | POA: Diagnosis present

## 2016-08-24 DIAGNOSIS — I2510 Atherosclerotic heart disease of native coronary artery without angina pectoris: Secondary | ICD-10-CM

## 2016-08-24 DIAGNOSIS — Z7952 Long term (current) use of systemic steroids: Secondary | ICD-10-CM | POA: Diagnosis not present

## 2016-08-24 DIAGNOSIS — Z7982 Long term (current) use of aspirin: Secondary | ICD-10-CM | POA: Diagnosis not present

## 2016-08-24 DIAGNOSIS — D500 Iron deficiency anemia secondary to blood loss (chronic): Secondary | ICD-10-CM | POA: Diagnosis present

## 2016-08-24 DIAGNOSIS — Z7902 Long term (current) use of antithrombotics/antiplatelets: Secondary | ICD-10-CM | POA: Diagnosis not present

## 2016-08-24 DIAGNOSIS — N183 Chronic kidney disease, stage 3 (moderate): Secondary | ICD-10-CM | POA: Diagnosis present

## 2016-08-24 DIAGNOSIS — Z800 Family history of malignant neoplasm of digestive organs: Secondary | ICD-10-CM | POA: Diagnosis not present

## 2016-08-24 DIAGNOSIS — E782 Mixed hyperlipidemia: Secondary | ICD-10-CM | POA: Diagnosis present

## 2016-08-24 DIAGNOSIS — I739 Peripheral vascular disease, unspecified: Secondary | ICD-10-CM | POA: Diagnosis present

## 2016-08-24 DIAGNOSIS — Z951 Presence of aortocoronary bypass graft: Secondary | ICD-10-CM | POA: Diagnosis not present

## 2016-08-24 DIAGNOSIS — K922 Gastrointestinal hemorrhage, unspecified: Secondary | ICD-10-CM | POA: Diagnosis not present

## 2016-08-24 DIAGNOSIS — C3412 Malignant neoplasm of upper lobe, left bronchus or lung: Secondary | ICD-10-CM | POA: Diagnosis present

## 2016-08-24 DIAGNOSIS — J439 Emphysema, unspecified: Secondary | ICD-10-CM | POA: Diagnosis present

## 2016-08-24 DIAGNOSIS — E039 Hypothyroidism, unspecified: Secondary | ICD-10-CM | POA: Diagnosis present

## 2016-08-24 DIAGNOSIS — Z87891 Personal history of nicotine dependence: Secondary | ICD-10-CM | POA: Diagnosis not present

## 2016-08-24 DIAGNOSIS — D6851 Activated protein C resistance: Secondary | ICD-10-CM | POA: Diagnosis present

## 2016-08-24 DIAGNOSIS — Z8249 Family history of ischemic heart disease and other diseases of the circulatory system: Secondary | ICD-10-CM | POA: Diagnosis not present

## 2016-08-24 LAB — COMPREHENSIVE METABOLIC PANEL
ALBUMIN: 2.6 g/dL — AB (ref 3.5–5.0)
ALK PHOS: 44 U/L (ref 38–126)
ALT: 16 U/L — AB (ref 17–63)
ANION GAP: 3 — AB (ref 5–15)
AST: 19 U/L (ref 15–41)
BUN: 21 mg/dL — AB (ref 6–20)
CALCIUM: 9 mg/dL (ref 8.9–10.3)
CO2: 26 mmol/L (ref 22–32)
CREATININE: 1.32 mg/dL — AB (ref 0.61–1.24)
Chloride: 113 mmol/L — ABNORMAL HIGH (ref 101–111)
GFR calc Af Amer: 55 mL/min — ABNORMAL LOW (ref >60)
GFR calc non Af Amer: 47 mL/min — ABNORMAL LOW (ref >60)
GLUCOSE: 71 mg/dL (ref 65–99)
Potassium: 4.2 mmol/L (ref 3.5–5.1)
SODIUM: 142 mmol/L (ref 135–145)
Total Bilirubin: 0.6 mg/dL (ref 0.3–1.2)
Total Protein: 4.6 g/dL — ABNORMAL LOW (ref 6.5–8.1)

## 2016-08-24 LAB — CBC
HCT: 30.6 % — ABNORMAL LOW (ref 39.0–52.0)
HEMATOCRIT: 33.4 % — AB (ref 39.0–52.0)
HEMATOCRIT: 31.2 % — AB (ref 39.0–52.0)
HEMOGLOBIN: 9.5 g/dL — AB (ref 13.0–17.0)
HEMOGLOBIN: 10 g/dL — AB (ref 13.0–17.0)
Hemoglobin: 10.3 g/dL — ABNORMAL LOW (ref 13.0–17.0)
MCH: 28.5 pg (ref 26.0–34.0)
MCH: 28.5 pg (ref 26.0–34.0)
MCH: 29.1 pg (ref 26.0–34.0)
MCHC: 31 g/dL (ref 30.0–36.0)
MCHC: 30.8 g/dL (ref 30.0–36.0)
MCHC: 32.1 g/dL (ref 30.0–36.0)
MCV: 91.9 fL (ref 78.0–100.0)
MCV: 92.5 fL (ref 78.0–100.0)
MCV: 90.7 fL (ref 78.0–100.0)
PLATELETS: 177 10*3/uL (ref 150–400)
Platelets: 159 10*3/uL (ref 150–400)
Platelets: 174 10*3/uL (ref 150–400)
RBC: 3.33 MIL/uL — AB (ref 4.22–5.81)
RBC: 3.61 MIL/uL — ABNORMAL LOW (ref 4.22–5.81)
RBC: 3.44 MIL/uL — AB (ref 4.22–5.81)
RDW: 16.3 % — ABNORMAL HIGH (ref 11.5–15.5)
RDW: 16.2 % — AB (ref 11.5–15.5)
RDW: 16.5 % — ABNORMAL HIGH (ref 11.5–15.5)
WBC: 5 10*3/uL (ref 4.0–10.5)
WBC: 6.5 10*3/uL (ref 4.0–10.5)
WBC: 4.8 10*3/uL (ref 4.0–10.5)

## 2016-08-24 LAB — PROTIME-INR
INR: 1.24
Prothrombin Time: 15.7 seconds — ABNORMAL HIGH (ref 11.4–15.2)

## 2016-08-24 LAB — TROPONIN I: Troponin I: 0.03 ng/mL (ref <0.03)

## 2016-08-24 MED ORDER — PANTOPRAZOLE SODIUM 40 MG PO TBEC
40.0000 mg | DELAYED_RELEASE_TABLET | Freq: Two times a day (BID) | ORAL | Status: DC
  Administered 2016-08-24: 40 mg via ORAL
  Filled 2016-08-24: qty 1

## 2016-08-24 MED ORDER — POLYETHYLENE GLYCOL 3350 17 GM/SCOOP PO POWD
1.0000 | Freq: Once | ORAL | Status: AC
  Administered 2016-08-24: 255 g via ORAL
  Filled 2016-08-24: qty 255

## 2016-08-24 MED ORDER — POLYETHYLENE GLYCOL 3350 17 G PO PACK: 17.0000 g | PACK | Freq: Every day | ORAL | Status: DC | PRN

## 2016-08-24 NOTE — Progress Notes (Signed)
Rn confirmed with Dr Gwenlyn Found with cardiology about patient receiving plavix this AM. Dr Gwenlyn Found stated that he was deferring to the primary team and that he was OK with patient having dose today or tomorrow. RN notified Dr Allyson Sabal with primary team and she stated OK to give AM dose.

## 2016-08-24 NOTE — Progress Notes (Signed)
Daily Rounding Note  08/24/2016, 10:44 AM  LOS: 0 days   SUBJECTIVE:   Chief complaint: black stools.  Last BM, black/formed, yesterday AM.   No dizziness or weakness or SOB.    Walking in hall without issues.   Tolerating solids.  Hoping to have BM so asking for Miralax, which he uses prn ~ once per week at home.  Looking forward to discharge, whenever this is.   OBJECTIVE:         Vital signs in last 24 hours:    Temp:  [98 F (36.7 C)] 98 F (36.7 C) (04/13 0500) Pulse Rate:  [68-79] 75 (04/13 0643) Resp:  [16] 16 (04/13 0500) BP: (99-176)/(45-66) 145/62 (04/13 0643) SpO2:  [97 %-100 %] 97 % (04/13 0847) Weight:  [59 kg (130 lb 1.6 oz)-59.4 kg (131 lb)] 59 kg (130 lb 1.6 oz) (04/13 0647) Last BM Date: 08/23/16 Filed Weights   08/23/16 1302 08/24/16 0647  Weight: 59.4 kg (131 lb) 59 kg (130 lb 1.6 oz)   General: pleasant, somewhat frail.   Comfortable.  No distress Heart: RRR Chest: clear bil.  No labored breathing or cough Abdomen: soft, NT, active BS  Extremities: no CCE.   Neuro/Psych:  Fully alert and oriented.    Lab Results:  Recent Labs  08/23/16 2015 08/24/16 0212 08/24/16 1016  WBC 6.1 5.0 6.5  HGB 10.4* 9.5* 10.3*  HCT 32.6* 30.6* 33.4*  PLT 161 159 177   BMET  Recent Labs  08/23/16 1451 08/24/16 0212  NA 143 142  K 4.3 4.2  CL 112* 113*  CO2 26 26  GLUCOSE 81 71  BUN 24* 21*  CREATININE 1.30* 1.32*  CALCIUM 9.3 9.0   LFT  Recent Labs  08/23/16 1451 08/24/16 0212  PROT 4.7* 4.6*  ALBUMIN 2.8* 2.6*  AST 21 19  ALT 18 16*  ALKPHOS 44 44  BILITOT 0.8 0.6   PT/INR  Recent Labs  08/24/16 0212  LABPROT 15.7*  INR 1.24   Hepatitis Panel No results for input(s): HEPBSAG, HCVAB, HEPAIGM, HEPBIGM in the last 72 hours.  Studies/Results: Acute Abdominal Series  Result Date: 08/23/2016 CLINICAL DATA:  Melanotic stools, history of lung carcinoma EXAM: DG ABDOMEN ACUTE W/  1V CHEST COMPARISON:  CT abdomen pelvis of 08/23/2016 and chest x-ray of 08/24/2006, PET-CT of 06/21/2016, CT chest of 06/11/2016 FINDINGS: Right apical pleural-parenchymal scarring remains. Linear scarring also is noted in the left upper lung. A nodular opacity medially in the right upper lung field appears to correspond to a calcified plaque by CT. No new parenchymal infiltrate or pleural effusion is seen. The heart is within normal limits in size. The bones are osteopenic. Supine and erect views of the abdomen show no bowel obstruction. No free air is seen on the erect view. Some contrast is noted in the renal collecting systems and in the bladder from today's CT scan. The bones are osteopenic. Common iliac artery stents are present. IMPRESSION: 1. Chronic changes on chest x-ray.  No active lung disease. 2. No bowel obstruction.  No free air. Electronically Signed   By: Ivar Drape M.D.   On: 08/23/2016 16:58   Scheduled Meds: . amLODipine  5 mg Oral Daily  . clopidogrel  75 mg Oral Daily  . furosemide  40 mg Oral Daily  . guaiFENesin  600 mg Oral BID  . ipratropium  1 spray Each Nare BID  . levothyroxine  75 mcg  Oral QAC breakfast  . lisinopril  20 mg Oral Daily  . mometasone-formoterol  2 puff Inhalation BID  . montelukast  10 mg Oral QHS  . pantoprazole (PROTONIX) IV  40 mg Intravenous Q12H  . predniSONE  10 mg Oral Q breakfast  . sodium chloride flush  3 mL Intravenous Q12H   Continuous Infusions: PRN Meds:.acetaminophen **OR** acetaminophen, albuterol, bisacodyl, HYDROcodone-acetaminophen, ketorolac, magnesium citrate, nitroGLYCERIN, ondansetron **OR** ondansetron (ZOFRAN) IV, senna-docusate, traZODone   ASSESMENT:   *  [place], blood loss anemia.  Started Plavix post cardiac stent 4/9.  Factor V leiden deficiency.  Excessive bleeding at left wrist post cath and nose bleed 2 days ago.  Continues on Plavix for fresh stent.  IV BID Protonix started 4/13, 3 doses so far.  Hgb stable,  down 2.5 grams from level 9 days ago, pre cath.   *  LUL lung cancer, recurrent post radiation.    PLAN   *  Miralax added.    *  Switch to po BID Protonix. Take PPI (per formulary preference) BID for 2 weeks, then drop to 1x daily permanently    Azucena Freed  08/24/2016, 10:44 AM Pager: [phone]   ________________________________________________________________________  Velora Heckler GI MD note:  I personally examined the patient, reviewed the data and agree with the assessment and plan described above.  NO BM in 36-48 hours now.  The bleeding has clearly stopped. I suspect minor gastric erosion, ulcer, AVM.  I agree with PPI twice daily for 2 weeks then once daily indefinitely.  If no overt new bleeding overnight then he is safe for d/c.  He understands the first BM will probably not be normal appearing and that is probably just old blood coming out finally.   Please call or page with any further questions or concerns.  Dr. Collene Mares is covering [place] [facility] this weekend.   Owens Loffler, MD [facility] Pager [phone]

## 2016-08-24 NOTE — Progress Notes (Signed)
[facility] PROGRESS NOTE  Glenn Reilly [facility]GH:[id number] DOB: 04/28/1929 DOA: 08/23/2016   PCP: Townsend Roger, MD     Assessment/Plan: Active Problems:   GIB (gastrointestinal bleeding)   Glenn Reilly is a 83 y.o. male with extensive medical history listed below,  transferred from [facility] for evaluation of melanotic stools. In review, patient had a recent catheterization at [facility] for 3 V disease with total occlusion of the pLAD, requiring dual antiplatelet therapy for 12 months with Plavix. However, he was only able to take 2 doses, as he began to experience multiple melanotic stools today.  GI and cardiology were consulted  Assessment and plan  Lower GI bleed  Likely due to Plavix Hb 10.5, was 13  BUN  24  Continue telemetry Hemoglobin 12.8>10.4>9.5 INR 1.1 Type and screen complete Per GI patient has a history of upper GI bleeding-started on PPI twice a day, may need EGD Hold all NSAIDs including preadmission aspirin Cardiology consulted to help with antiplatelet therapy in the setting of GI bleeding, aspirin on hold, okay with Dr. Gwenlyn Found to continue Plavix   CAD s/p cardiac cath with stent on 4/9 , d/c's on PLavix, now with Plavix on hold due to GIB,   patient is cardiac pain free at this time. Cardiology can follow peripherally   Chronic kidney disease stage 3   Current Cr 1.27, at baseline  Recent Labs       Lab Results  Component Value Date   CREATININE 1.27 (H) 08/20/2016   CREATININE 1.64 (H) 08/15/2016   CREATININE 1.20 (H) 02/01/2015     DC IV fluids and the patient is on Lasix Repeat CMET in am   Lung cancer.  Secor RUL lobectomy 2001.  Radiation for LUL squamous cell cancer 2015.  Recurrence of LUL cancer this 05/2016, being followed by oncology/pulmonary Dr Hinton Rao.    COPD without exacerbation  Continue home meds   Hypothyroidism: Continue home Synthroid  History of F V leiden mutation/ History of stroke  .  Patient was on ASA and PLavix, which are on hold for today due to GIB. Now restarted Plavix     DVT prophylaxsis SCDs  Code Status:  Full code    Family Communication: Discussed in detail with the patient, all imaging results, lab results explained to the patient   Disposition Plan:  As per GI/cardiology      Consultants:  Cardiology  GI    Procedures:  None  Antibiotics: Anti-infectives    None         HPI/Subjective: Chief complaint: black stools.  Last BM, black/formed, yesterday AM.  Objective: Vitals:   08/24/16 0500 08/24/16 0501 08/24/16 0643 08/24/16 0647  BP: (!) 176/66 (!) 169/48 (!) 145/62   Pulse: 72 68 75   Resp: 16     Temp: 98 F (36.7 C)     TempSrc:      SpO2: 97%     Weight:    59 kg (130 lb 1.6 oz)  Height:        Intake/Output Summary (Last 24 hours) at 08/24/16 0846 Last data filed at 08/24/16 0504  Gross per 24 hour  Intake              150 ml  Output             1225 ml  Net            -1075 ml  Exam:  Examination:  General exam: Appears calm and comfortable  Respiratory system: Clear to auscultation. Respiratory effort normal. Cardiovascular system: S1 & S2 heard, RRR. No JVD, murmurs, rubs, gallops or clicks. No pedal edema. Gastrointestinal system: Abdomen is nondistended, soft and nontender. No organomegaly or masses felt. Normal bowel sounds heard. Central nervous system: Alert and oriented. No focal neurological deficits. Extremities: Symmetric 5 x 5 power. Skin: No rashes, lesions or ulcers Psychiatry: Judgement and insight appear normal. Mood & affect appropriate.     Data Reviewed: I have personally reviewed following labs and imaging studies  Micro Results No results found for this or any previous visit (from the past 240 hour(s)).  Radiology Reports Acute Abdominal Series  Result Date: 08/23/2016 CLINICAL DATA:  Melanotic stools, history of lung carcinoma EXAM: DG ABDOMEN ACUTE W/ 1V CHEST  COMPARISON:  CT abdomen pelvis of 08/23/2016 and chest x-ray of 08/24/2006, PET-CT of 06/21/2016, CT chest of 06/11/2016 FINDINGS: Right apical pleural-parenchymal scarring remains. Linear scarring also is noted in the left upper lung. A nodular opacity medially in the right upper lung field appears to correspond to a calcified plaque by CT. No new parenchymal infiltrate or pleural effusion is seen. The heart is within normal limits in size. The bones are osteopenic. Supine and erect views of the abdomen show no bowel obstruction. No free air is seen on the erect view. Some contrast is noted in the renal collecting systems and in the bladder from today's CT scan. The bones are osteopenic. Common iliac artery stents are present. IMPRESSION: 1. Chronic changes on chest x-ray.  No active lung disease. 2. No bowel obstruction.  No free air. Electronically Signed   By: Ivar Drape M.D.   On: 08/23/2016 16:58     CBC  Recent Labs Lab 08/23/16 2015 08/24/16 0212  WBC 6.1 5.0  HGB 10.4* 9.5*  HCT 32.6* 30.6*  PLT 161 159  MCV 91.8 91.9  MCH 29.3 28.5  MCHC 31.9 31.0  RDW 16.6* 16.3*    Chemistries   Recent Labs Lab 08/20/16 0552 08/23/16 1451 08/24/16 0212  NA 142 143 142  K 4.6 4.3 4.2  CL 108 112* 113*  CO2 '26 26 26  '$ GLUCOSE 79 81 71  BUN 24* 24* 21*  CREATININE 1.27* 1.30* 1.32*  CALCIUM 9.9 9.3 9.0  AST  --  21 19  ALT  --  18 16*  ALKPHOS  --  44 44  BILITOT  --  0.8 0.6   ------------------------------------------------------------------------------------------------------------------ estimated creatinine clearance is 34.1 mL/min (A) (by C-G formula based on SCr of 1.32 mg/dL (H)). ------------------------------------------------------------------------------------------------------------------ No results for input(s): HGBA1C in the last 72 hours. ------------------------------------------------------------------------------------------------------------------ No results for  input(s): CHOL, HDL, LDLCALC, TRIG, CHOLHDL, LDLDIRECT in the last 72 hours. ------------------------------------------------------------------------------------------------------------------ No results for input(s): TSH, T4TOTAL, T3FREE, THYROIDAB in the last 72 hours.  Invalid input(s): FREET3 ------------------------------------------------------------------------------------------------------------------ No results for input(s): VITAMINB12, FOLATE, FERRITIN, TIBC, IRON, RETICCTPCT in the last 72 hours.  Coagulation profile  Recent Labs Lab 08/24/16 0212  INR 1.24    No results for input(s): DDIMER in the last 72 hours.  Cardiac Enzymes  Recent Labs Lab 08/23/16 1451 08/23/16 2015 08/24/16 0212  TROPONINI 0.03* 0.03* 0.03*   ------------------------------------------------------------------------------------------------------------------ Invalid input(s): POCBNP   CBG: No results for input(s): GLUCAP in the last 168 hours.     Studies: Acute Abdominal Series  Result Date: 08/23/2016 CLINICAL DATA:  Melanotic stools, history of lung carcinoma EXAM: DG ABDOMEN ACUTE W/ 1V CHEST  COMPARISON:  CT abdomen pelvis of 08/23/2016 and chest x-ray of 08/24/2006, PET-CT of 06/21/2016, CT chest of 06/11/2016 FINDINGS: Right apical pleural-parenchymal scarring remains. Linear scarring also is noted in the left upper lung. A nodular opacity medially in the right upper lung field appears to correspond to a calcified plaque by CT. No new parenchymal infiltrate or pleural effusion is seen. The heart is within normal limits in size. The bones are osteopenic. Supine and erect views of the abdomen show no bowel obstruction. No free air is seen on the erect view. Some contrast is noted in the renal collecting systems and in the bladder from today's CT scan. The bones are osteopenic. Common iliac artery stents are present. IMPRESSION: 1. Chronic changes on chest x-ray.  No active lung disease. 2.  No bowel obstruction.  No free air. Electronically Signed   By: Ivar Drape M.D.   On: 08/23/2016 16:58      Lab Results  Component Value Date   HGBA1C 5.7 (H) 11/17/2013   Lab Results  Component Value Date   CREATININE 1.32 (H) 08/24/2016       Scheduled Meds: . amLODipine  5 mg Oral Daily  . clopidogrel  75 mg Oral Daily  . furosemide  40 mg Oral Daily  . guaiFENesin  600 mg Oral BID  . ipratropium  1 spray Each Nare BID  . levothyroxine  75 mcg Oral QAC breakfast  . lisinopril  20 mg Oral Daily  . mometasone-formoterol  2 puff Inhalation BID  . montelukast  10 mg Oral QHS  . pantoprazole (PROTONIX) IV  40 mg Intravenous Q12H  . predniSONE  10 mg Oral Q breakfast  . sodium chloride flush  3 mL Intravenous Q12H   Continuous Infusions: . sodium chloride 75 mL/hr at 08/24/16 0406     LOS: 0 days    Time spent: >30 MINS    Reyne Dumas  [facility] Pager [phone]. If 7PM-7AM, please contact night-coverage at [email or web address], password [facility] 08/24/2016, 8:46 AM  LOS: 0 days

## 2016-08-24 NOTE — Plan of Care (Signed)
Problem: Activity: Goal: Risk for activity intolerance will decrease Outcome: Completed/Met Date Met: 08/24/16 Walked in hall several times today with family and alone

## 2016-08-24 NOTE — Consult Note (Signed)
CARDIOLOGY CONSULT NOTE   [name] MRN: [id number] DOB/AGE: Nov 18, 1928 83 y.o.  Admit date: 08/23/2016  Primary Physician   Townsend Roger, MD Primary Cardiologist   Dr. Gwenlyn Found Reason for Consultation  GI bleed on plavix  Requesting Physician  Dr. Nada Maclachlan  HPI: Glenn Reilly is a 83 y.o. male who is being seen today for the evaluation of GI bleed on plavix at the request of Dr. Nada Maclachlan. He has hx of CAD s/p CABG 11/20/2013 LIMA to LAD and SVG to circumflex, PAD s/p bilateral iliac stenting remotely, carotid artery disease,  factor V Leiden mutation, heterozygous, hypertension, hyperlipidemia, h/o lung CA s/p R lower lobectomyand COPD.  He initially underwent cardiac catheterization on 11/05/2013 which showed 50% left main, 70% proximal LAD, 75-80% left circumflex lesion. CT surgery was recommended, and he subsequently underwent the off-pump two-vessel CABG with LIMA to the distal LAD, SVG to OM1 by Dr. Roxy Manns on 11/20/2013. The patient was evaluated by Dr. Gwenlyn Found in September 2016 for increasing dyspnea on exertion, lower extremity edema and upper extremity discomfort. His Lasix was increased. Echocardiogram obtained on 01/26/2015 showed EF 96-78%, grade 1 diastolic dysfunction, peak PA pressure 19 mmHg. Myoview obtained on 01/25/2015 showed normal perfusion, EF 55-65%, low risk study. Lower extremity venous ultrasound was negative for blood clot. Carotid ultrasound performed in the same month showed moderate right ICA stenosis 40-59%, recommend 12 month follow-up.  Seen in March 2017, he was complaining some occasional chest pressure. Repeat Myoview obtained on 08/09/2015 showed EF 61%, no obvious ischemia, overall low risk study. He had a negative CT angiogram of the chest on 08/20/2015 at [facility]. He had a repeat carotid Doppler on 01/26/2016 which showed moderate bilateral carotid artery stenosis, one year repeat image was recommended. More recently, it appears he was seen at  [facility] again, CT of the chest obtained in January was concerning for recurrent lung cancer.  Seen in clinic 08/15/16 for symptoms concerning for unstable angina. Very much reminiscent of the previous angina. Subsequently underwent LHC 08/20/16 with Dr. Burt Knack showing severe 3V disease with patent LIMA to LAD and SVG to 1st OM. S/p PCI of in-stent restenosis of the mRCA with normal LVEDP. Plan for DAPT with ASA/Plavix. Did have re-bleeding with TR band, seen by Dr. Burt Knack with bleeding controlled after removal of TR band.   He has  nosebleed on Tuesday 4/10. Last dose of ASA a.m. off Wednesday 4/11. Patient noted dark-like looking stool Wednesday evening and Thursday morning leading to evaluation at [facility]. Other complaints include abdominal pain. No improvement on Zantac x 1. He denies any chest pain, shortness of breath, palpitations, lower extremity edema, orthopnea, PND, syncope, fever or chills. He is transferred to [facility] for further evaluation. The patient was seen by GI and plan to restart Plavix tomorrow. Treat medically with PPI. Concerning of UGI. Follow closely.  Hgb was 12.8 on 4/4 pre cath. 10.4 yesterday and today 9.5. He hasn't had any BM since 4/11 pm. EKG with NSR and TWI in inferior lateral leads - similar to prior EKG- personally reviewed. Telemetry showed sinus rhythm -personally reviewed. Troponin 0.03 x 3.     Past Medical History:  Diagnosis Date  . Asbestosis ([place])   . Asthma   . CAD, [place] PCI 2004 Cath 11/05/2013 with 50% LMain, 75% prox LAD, 80% prox LCx, 50% RCA with normal EF  08/20/16 Cath with PCI DES--> RCA  . Carotid  artery disease ([place])   . Cataract   . Complication of anesthesia    lt side throat parlysis  . COPD with emphysema ([place]) 01/13/2010   Spirometry 10/19/2013 is reviewed. This reveals FEV1 44% predicted FVC 47% predicted and FEF 25 7537% predicted   . Detached retina   . Dysphagia as late effect of stroke     Developed as complication of stroke following left carotid endarterectomy with documented left hypoglossal nerve paralysis   . Factor 5 Leiden mutation, heterozygous ([place])   . GI bleed 08/2016  . 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 HTN (hypertension)   . Hyperlipemia   . HYPERLIPIDEMIA-MIXED 01/13/2010   Qualifier: Diagnosis of  By: Olevia Perches, MD, Glenetta Hew   . HYPERTENSION, BENIGN 01/13/2010   Qualifier: Diagnosis of  By: Olevia Perches, MD, Glenetta Hew   . Hypothyroidism   . Lung cancer ([place]) 2001   lower right lobe surgery to remove cancer, no chemo or radiation  . Lung mass 10/19/2013   CT of the chest 10/12/2013 reveals an enlarging left upper lobe irregular nodule. Now 1.4 compared to 1.0 cm in diameter when compared to March 2015 CT scan. There is calcified pleural plaques seen bilaterally. There is subpleural scarring seen as well. Previous right lower lobectomy has been demonstrated.  PET-CT scan from June 2015 is reviewed this shows increased uptake (SUV > 8) in left upper   . PVD 01/13/2010   Qualifier: Diagnosis of  By: Olevia Perches, MD, Glenetta Hew   . PVD (peripheral vascular disease) ([place]) 2011   carotid endarterectomy, pv stents  . Radiation 9/1, 9/3, 01/19/14   Left upper lung 3 fractions  . S/P CABG x 2 11/20/2013   Off-pump CABG x2 using LIMA to LAD, SVG to OM1, EVH via right thigh  . Stroke [facility]) 07/2009   left carotid endarterectomy     Past Surgical History:  Procedure Laterality Date  . APPENDECTOMY    . BIOPSY Left 11/20/2013   Procedure: BIOPSY Left Upper Lobe;  Surgeon: Rexene Alberts, MD;  Location: [place];  Service: Open Heart Surgery;  Laterality: Left;  . CAROTID ENDARTERECTOMY Left 2011   [facility] - complicated by post-op stroke  . CORONARY ARTERY BYPASS GRAFT N/A 11/20/2013   Procedure: OFF  PUMP CORONARY ARTERY BYPASS GRAFTING (CABG) x2: LIMA to LAD, SVG to Obtuse Marginal 1.;  Surgeon: Rexene Alberts, MD;  Location: [place];  Service: Open Heart Surgery;  Laterality: N/A;  . CORONARY STENT INTERVENTION N/A 08/20/2016   Procedure: Coronary Stent Intervention;  Surgeon: Sherren Mocha, MD;  Location: [place] CV LAB;  Service: Cardiovascular;  Laterality: N/A;  . FRACTURE SURGERY Left    foot  . HEMORRHOID SURGERY    . ILIAC ARTERY STENT Bilateral 2011   [facility] - performed for asymptomatic iliac artery disease  . INTRAOPERATIVE TRANSESOPHAGEAL ECHOCARDIOGRAM N/A 11/20/2013   Procedure: INTRAOPERATIVE TRANSESOPHAGEAL ECHOCARDIOGRAM;  Surgeon: Rexene Alberts, MD;  Location: [place];  Service: Open Heart Surgery;  Laterality: N/A;  . LEFT HEART CATH AND CORONARY ANGIOGRAPHY N/A 08/20/2016   Procedure: Left Heart Cath and Coronary Angiography;  Surgeon: Sherren Mocha, MD;  Location: [place] CV LAB;  Service: Cardiovascular;  Laterality: N/A;  . LEFT HEART CATHETERIZATION WITH CORONARY ANGIOGRAM N/A 11/05/2013  Procedure: LEFT HEART CATHETERIZATION WITH CORONARY ANGIOGRAM;  Surgeon: Lorretta Harp, MD;  Location: [facility] CATH LAB;  Service: Cardiovascular;  Laterality: N/A;  . 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 PERCUTANEOUS CORONARY STENT INTERVENTION (PCI-S)  1999   RCA  . PROSTATE SURGERY     TURP  . VIDEO ASSISTED THORACOSCOPY (VATS)/EMPYEMA Right 07/13/1999   drainage of post-operative empyema following RLLobectomy    Allergies  Allergen Reactions  . Demerol [Meperidine] Anaphylaxis  . Statins Other (See Comments)    Myalgia     I have reviewed the patient's current medications . amLODipine  5 mg Oral Daily  . clopidogrel  75 mg Oral Daily  . furosemide  40 mg Oral Daily  . guaiFENesin  600 mg Oral BID  . ipratropium  1 spray Each Nare BID  . levothyroxine  75 mcg Oral QAC  breakfast  . lisinopril  20 mg Oral Daily  . mometasone-formoterol  2 puff Inhalation BID  . montelukast  10 mg Oral QHS  . pantoprazole (PROTONIX) IV  40 mg Intravenous Q12H  . predniSONE  10 mg Oral Q breakfast  . sodium chloride flush  3 mL Intravenous Q12H    acetaminophen **OR** acetaminophen, albuterol, bisacodyl, HYDROcodone-acetaminophen, ketorolac, magnesium citrate, nitroGLYCERIN, ondansetron **OR** ondansetron (ZOFRAN) IV, senna-docusate, traZODone  Prior to Admission medications   Medication Sig Start Date End Date Taking? Authorizing Provider  albuterol (PROVENTIL HFA;VENTOLIN HFA) 108 (90 BASE) MCG/ACT inhaler Inhale 2 puffs into the lungs every 6 (six) hours as needed for wheezing or shortness of breath.   Yes Historical Provider, MD  amLODipine (NORVASC) 5 MG tablet Take 1 tablet (5 mg total) by mouth daily. 08/15/16 11/13/16 Yes Lorretta Harp, MD  aspirin 81 MG tablet Take 81 mg by mouth at bedtime.    Yes Historical Provider, MD  clopidogrel (PLAVIX) 75 MG tablet Take 1 tablet (75 mg total) by mouth daily. 08/20/16  Yes Cheryln Manly, NP  Coenzyme Q10 (CO Q 10 PO) Take 1 tablet by mouth daily.   Yes Historical Provider, MD  furosemide (LASIX) 40 MG tablet Take 1 tablet (40 mg total) by mouth daily. Patient taking differently: Take 20 mg by mouth daily.  08/15/16  Yes Almyra Deforest, PA  ipratropium (ATROVENT) 0.03 % nasal spray Place 2 sprays into both nostrils 2 (two) times daily as needed for allergies. 08/17/16  Yes Historical Provider, MD  ipratropium-albuterol (DUONEB) 0.5-2.5 (3) MG/3ML SOLN Take 3 mLs by nebulization 4 (four) times daily.    Yes Historical Provider, MD  levothyroxine (SYNTHROID, LEVOTHROID) 75 MCG tablet Take 75 mcg by mouth daily before breakfast.   Yes Historical Provider, MD  lisinopril (PRINIVIL,ZESTRIL) 20 MG tablet Take 1 tablet (20 mg total) by mouth daily. 08/15/16  Yes Lorretta Harp, MD  mometasone-formoterol (DULERA) 200-5 MCG/ACT AERO Inhale 2  puffs into the lungs 2 (two) times daily.   Yes Historical Provider, MD  montelukast (SINGULAIR) 10 MG tablet Take 10 mg by mouth at bedtime. 07/06/16  Yes Historical Provider, MD  pantoprazole (PROTONIX) 40 MG injection Inject 80 mg into the vein once.   Yes Historical Provider, MD  predniSONE (DELTASONE) 10 MG tablet Take 10 mg by mouth daily with breakfast.   Yes Historical Provider, MD     Social History   Social History  . Marital status: Widowed    Spouse name: N/A  . Number of  children: 1  . Years of education: N/A   Occupational History  . 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 Smoking status: Former Smoker    Packs/day: 1.00    Years: 30.00    Types: Cigarettes    Quit date: 03/03/1979  . Smokeless tobacco: Current User    Types: Chew  . Alcohol use No  . Drug use: No  . Sexual activity: Not on file   Other Topics Concern  . Not on file   Social History Narrative  . No narrative on file    Family Status  Relation Status  . Mother Deceased  . Father Deceased  . Brother Deceased  . Brother Deceased  . Brother   . Brother    Family History  Problem Relation Age of Onset  . Heart attack Mother   . Heart attack Father   . Cancer Brother     esophagus and lung  . Cancer Brother     lung     ROS:  Full 14 point review of systems complete and found to be negative unless listed above.  Physical Exam: Blood pressure (!) 145/62, pulse 75, temperature 98 F (36.7 C), resp. rate 16, height '5\' 8"'$  (1.727 m), weight 130 lb 1.6 oz (59 kg), SpO2 97 %.  General: Well developed, well nourished, male in no acute distress Head: Eyes PERRLA, No xanthomas. Normocephalic and atraumatic, oropharynx without edema or exudate.  Lungs: Resp regular and unlabored, CTA. Heart: RRR no s3, s4, or murmurs..   Neck: No carotid bruits. No lymphadenopathy.  JVD. Abdomen: Bowel sounds present, abdomen soft and non-tender without masses  or hernias noted. Msk:  No spine or cva tenderness. No weakness, no joint deformities or effusions. Extremities: No clubbing, cyanosis or edema. DP/PT/Radials 2+ and equal bilaterally.multiple bruise/ecchymosis on upper extremity.  Neuro: Alert and oriented X 3. No focal deficits noted. Psych:  Good affect, responds appropriately Skin: No rashes or lesions noted.  Labs:   Lab Results  Component Value Date   WBC 5.0 08/24/2016   HGB 9.5 (L) 08/24/2016   HCT 30.6 (L) 08/24/2016   MCV 91.9 08/24/2016   PLT 159 08/24/2016    Recent Labs  08/24/16 0212  INR 1.24    Recent Labs Lab 08/24/16 0212  NA 142  K 4.2  CL 113*  CO2 26  BUN 21*  CREATININE 1.32*  CALCIUM 9.0  PROT 4.6*  BILITOT 0.6  ALKPHOS 44  ALT 16*  AST 19  GLUCOSE 71  ALBUMIN 2.6*   Magnesium  Date Value Ref Range Status  11/21/2013 2.6 (H) 1.5 - 2.5 mg/dL Final    Recent Labs  08/23/16 1451 08/23/16 2015 08/24/16 0212  TROPONINI 0.03* 0.03* 0.03*   No results for input(s): TROPIPOC in the last 72 hours. Pro B Natriuretic peptide (BNP)  Date/Time Value Ref Range Status  04/21/2014 04:04 PM 140.0 (H) 0.0 - 100.0 pg/mL Final   No results found for: CHOL, HDL, LDLCALC, TRIG Lab Results  Component Value Date   DDIMER (H) 01/18/2010    0.61        AT THE INHOUSE ESTABLISHED CUTOFF VALUE OF 0.48 ug/mL FEU, THIS ASSAY HAS BEEN DOCUMENTED IN THE LITERATURE TO HAVE A SENSITIVITY AND NEGATIVE PREDICTIVE VALUE OF AT LEAST 98 TO 99%.  THE TEST RESULT SHOULD BE CORRELATED WITH AN ASSESSMENT OF THE CLINICAL PROBABILITY OF DVT / VTE.   No results  found for: LIPASE, AMYLASE TSH  Date/Time Value Ref Range Status  04/21/2014 04:04 PM 0.98 0.35 - 4.50 uIU/mL Final   No results found for: VITAMINB12, FOLATE, FERRITIN, TIBC, IRON, RETICCTPCT  Echo: 07/2016 Left ventricular function of 60-65%.  Cath 08/2016 Coronary Stent Intervention  Left Heart Cath and Coronary Angiography  Conclusion   1.  Severe three-vessel coronary artery disease with total occlusion of the proximal LAD, moderately severe stenosis of the proximal circumflex, and severe in-stent restenosis in the RCA 2. Status post aortocoronary bypass surgery with continued patency of the LIMA to LAD and saphenous vein graft to first OM 3. 6 vessel PCI of severe in-stent restenosis of the mid RCA 4. Normal LVEDP (left ventriculography deferred because of chronic kidney disease)  Recommendations: Dual antiplatelet therapy 12 months as tolerated, if necessary could interrupt dual antiplatelet therapy after 3 months. Same day discharge after at least 6 hours of IV fluids. Repeat metabolic panel in 37-62 hours as outpatient.     Radiology:  Acute Abdominal Series  Result Date: 08/23/2016 CLINICAL DATA:  Melanotic stools, history of lung carcinoma EXAM: DG ABDOMEN ACUTE W/ 1V CHEST COMPARISON:  CT abdomen pelvis of 08/23/2016 and chest x-ray of 08/24/2006, PET-CT of 06/21/2016, CT chest of 06/11/2016 FINDINGS: Right apical pleural-parenchymal scarring remains. Linear scarring also is noted in the left upper lung. A nodular opacity medially in the right upper lung field appears to correspond to a calcified plaque by CT. No new parenchymal infiltrate or pleural effusion is seen. The heart is within normal limits in size. The bones are osteopenic. Supine and erect views of the abdomen show no bowel obstruction. No free air is seen on the erect view. Some contrast is noted in the renal collecting systems and in the bladder from today's CT scan. The bones are osteopenic. Common iliac artery stents are present. IMPRESSION: 1. Chronic changes on chest x-ray.  No active lung disease. 2. No bowel obstruction.  No free air. Electronically Signed   By: Ivar Drape M.D.   On: 08/23/2016 16:58    ASSESSMENT AND PLAN:     1. CAD s/p CABG and recent PCI of in-stent restenosis of the mRCA  08/20/16 - Treated with DAPT with ASA and plavix.  - He was  given Plavix yesterday @ 2127 and Schedule today @ 10am, however not on ASA. Dr. Eugenia Pancoast 08/23/16 note says "Can restart plavix tomorrow". Seem need to start ASA today/tomorrow. Will defer to MD.  - No chest chest. Troponin 0.03 x 3. EKG without acute findings.   2. GI Bleed - suspected upper GI. Lorenda Cahill by Dr. Edison Nasuti. As above. No BM since PM of 4/11.  3.Acute anemia - HGb trending down (Hgb was 12.8 on 4/4 pre cath. 10.4 yesterday and today 9.5.). Follow daily.  - he was previously on either ASA or plavix without any bleeding issue. Never on DAPT. He has chronic hx of easily bruising.   4. FactorV Leiden mutation, heterozygous.   5. Recurrence of LUL cancer 05/2016 - followed by Dr. Scherrie Merritts.    SignedLeanor Kail, PA 08/24/2016, 9:41 AM Pager [phone]  Co-Sign MD  Agree with note by Robbie Lis PA-C  Agree with note by Robbie Lis PA-C. 83 year old thin and frail-appearing Caucasian male well known to me. He had remote coronary artery bypass grafting. He recently had intervention earlier this week by Dr. Burt Knack because of accelerated angina and had restenting of his mid RCA for in-stent restenosis. He was begun on aspirin and  Plavix. Apparently he had epistaxis and melena with a decline in his hemoglobin. This has since stopped. He has been off antibiotics therapy for 24-48 hours and has remained stable. He denies chest pain. His exam is benign. He has been seen by the GI service. I agree with restarting enalapril drugs tomorrow and assess for rebleeding. He is at high risk" stent thrombosis" with stopping antiplatelet therapy with recently placed drug-eluting stent.   Lorretta Harp, M.D., [place], [facility], [place] [address] [place], [place]  [address]  [phone] 08/24/2016 1:07 PM

## 2016-08-25 LAB — COMPREHENSIVE METABOLIC PANEL
ALK PHOS: 50 U/L (ref 38–126)
ALT: 18 U/L (ref 17–63)
AST: 24 U/L (ref 15–41)
Albumin: 2.9 g/dL — ABNORMAL LOW (ref 3.5–5.0)
Anion gap: 6 (ref 5–15)
BUN: 20 mg/dL (ref 6–20)
CALCIUM: 9.1 mg/dL (ref 8.9–10.3)
CO2: 26 mmol/L (ref 22–32)
CREATININE: 1.57 mg/dL — AB (ref 0.61–1.24)
Chloride: 110 mmol/L (ref 101–111)
GFR, EST AFRICAN AMERICAN: 45 mL/min — AB (ref >60)
GFR, EST NON AFRICAN AMERICAN: 39 mL/min — AB (ref >60)
Glucose, Bld: 91 mg/dL (ref 65–99)
Potassium: 4.3 mmol/L (ref 3.5–5.1)
SODIUM: 142 mmol/L (ref 135–145)
Total Bilirubin: 0.6 mg/dL (ref 0.3–1.2)
Total Protein: 5.2 g/dL — ABNORMAL LOW (ref 6.5–8.1)

## 2016-08-25 MED ORDER — ASPIRIN 81 MG PO TABS: 81.0000 mg | ORAL_TABLET | Freq: Every day | ORAL | 0 refills | Status: AC

## 2016-08-25 MED ORDER — PANTOPRAZOLE SODIUM 40 MG PO TBEC: 40.0000 mg | DELAYED_RELEASE_TABLET | Freq: Two times a day (BID) | ORAL | 1 refills | Status: AC

## 2016-08-25 MED ORDER — FUROSEMIDE 40 MG PO TABS: 40.0000 mg | ORAL_TABLET | Freq: Every day | ORAL | Status: DC

## 2016-08-25 MED ORDER — LISINOPRIL 20 MG PO TABS: 20.0000 mg | ORAL_TABLET | Freq: Every day | ORAL | 3 refills | Status: AC

## 2016-08-25 NOTE — Care Management Note (Signed)
Case Management Note  Patient Details  Name: Glenn Reilly MRN: [id number] Date of Birth: 01/03/1929  Subjective/Objective:                 Patient with order to DC to home. No CM consult, orders, or needs identified at this time.    Action/Plan:  DC to home.  Expected Discharge Date:  08/25/16               Expected Discharge Plan:  Home/Self Care  In-House Referral:     Discharge planning Services  CM Consult  Post Acute Care Choice:    Choice offered to:     DME Arranged:    DME Agency:     HH Arranged:    HH Agency:     Status of Service:  Completed, signed off  If discussed at [employer] of Stay Meetings, dates discussed:    Additional Comments:  Carles Collet, RN 08/25/2016, 9:54 AM

## 2016-08-25 NOTE — Progress Notes (Signed)
Patient discharge instruction reviewed,  Glenn Reilly daughter in law, and patient. They both verbalized understanding using teach back IV removed, catheter tip intact, skin intact with scatter bruising, patient has cell phone and upper and lower dentures on person.

## 2016-08-25 NOTE — Discharge Summary (Signed)
Physician Discharge Summary  BRITTAIN HOSIE MRN: [id number] DOB/AGE: [birth year] 83 y.o.  PCP: Townsend Roger, MD   Admit date: 08/23/2016 Discharge date: 08/25/2016  Discharge Diagnoses:    Active Problems:   GIB (gastrointestinal bleeding)   Melanotic stools    Follow-up recommendations Follow-up with PCP in 3-5 days , including all  additional recommended appointments as below Follow-up CBC, CMP in 3-5 days PPI twice daily for 2 weeks then once daily       Current Discharge Medication List    START taking these medications   Details  pantoprazole (PROTONIX) 40 MG tablet Take 1 tablet (40 mg total) by mouth 2 (two) times daily. Qty: 60 tablet, Refills: 1      CONTINUE these medications which have CHANGED   Details  aspirin 81 MG tablet Take 1 tablet (81 mg total) by mouth at bedtime. Qty: 30 tablet, Refills: 0    furosemide (LASIX) 40 MG tablet Take 1 tablet (40 mg total) by mouth daily. Qty: 30 tablet    lisinopril (PRINIVIL,ZESTRIL) 20 MG tablet Take 1 tablet (20 mg total) by mouth daily. Qty: 30 tablet, Refills: 3      CONTINUE these medications which have NOT CHANGED   Details  albuterol (PROVENTIL HFA;VENTOLIN HFA) 108 (90 BASE) MCG/ACT inhaler Inhale 2 puffs into the lungs every 6 (six) hours as needed for wheezing or shortness of breath.    amLODipine (NORVASC) 5 MG tablet Take 1 tablet (5 mg total) by mouth daily. Qty: 30 tablet, Refills: 3    clopidogrel (PLAVIX) 75 MG tablet Take 1 tablet (75 mg total) by mouth daily. Qty: 30 tablet, Refills: 0    Coenzyme Q10 (CO Q 10 PO) Take 1 tablet by mouth daily.    ipratropium (ATROVENT) 0.03 % nasal spray Place 2 sprays into both nostrils 2 (two) times daily as needed for allergies. Refills: 5    ipratropium-albuterol (DUONEB) 0.5-2.5 (3) MG/3ML SOLN Take 3 mLs by nebulization 4 (four) times daily.     levothyroxine (SYNTHROID, LEVOTHROID) 75 MCG tablet Take 75 mcg by mouth daily before breakfast.     mometasone-formoterol (DULERA) 200-5 MCG/ACT AERO Inhale 2 puffs into the lungs 2 (two) times daily.    montelukast (SINGULAIR) 10 MG tablet Take 10 mg by mouth at bedtime.    predniSONE (DELTASONE) 10 MG tablet Take 10 mg by mouth daily with breakfast.      STOP taking these medications     pantoprazole (PROTONIX) 40 MG injection      MUCINEX 600 MG 12 hr tablet          Discharge Condition: Stable Discharge Instructions Get Medicines reviewed and adjusted: Please take all your medications with you for your next visit with your Primary MD  Please request your Primary MD to go over all hospital tests and procedure/radiological results at the follow up, please ask your Primary MD to get all Hospital records sent to his/her office.  If you experience worsening of your admission symptoms, develop shortness of breath, life threatening emergency, suicidal or homicidal thoughts you must seek medical attention immediately by calling 911 or calling your MD immediately if symptoms less severe.  You must read complete instructions/literature along with all the possible adverse reactions/side effects for all the Medicines you take and that have been prescribed to you. Take any new Medicines after you have completely understood and accpet all the possible adverse reactions/side effects.   Do not drive when taking Pain medications.  Do not take more than prescribed Pain, Sleep and Anxiety Medications  Special Instructions: If you have smoked or chewed Tobacco in the last 2 yrs please stop smoking, stop any regular Alcohol and or any Recreational drug use.  Wear Seat belts while driving.  Please note  You were cared for by a hospitalist during your hospital stay. Once you are discharged, your primary care physician will handle any further medical issues. Please note that NO REFILLS for any discharge medications will be authorized once you are discharged, as it is imperative that you  return to your primary care physician (or establish a relationship with a primary care physician if you do not have one) for your aftercare needs so that they can reassess your need for medications and monitor your lab values.     Allergies  Allergen Reactions  . Demerol [Meperidine] Anaphylaxis  . Statins Other (See Comments)    Myalgia       Disposition: 01-Home or Self Care   Consults:  GI, cardiology     Significant Diagnostic Studies:  Acute Abdominal Series  Result Date: 08/23/2016 CLINICAL DATA:  Melanotic stools, history of lung carcinoma EXAM: DG ABDOMEN ACUTE W/ 1V CHEST COMPARISON:  CT abdomen pelvis of 08/23/2016 and chest x-ray of 08/24/2006, PET-CT of 06/21/2016, CT chest of 06/11/2016 FINDINGS: Right apical pleural-parenchymal scarring remains. Linear scarring also is noted in the left upper lung. A nodular opacity medially in the right upper lung field appears to correspond to a calcified plaque by CT. No new parenchymal infiltrate or pleural effusion is seen. The heart is within normal limits in size. The bones are osteopenic. Supine and erect views of the abdomen show no bowel obstruction. No free air is seen on the erect view. Some contrast is noted in the renal collecting systems and in the bladder from today's CT scan. The bones are osteopenic. Common iliac artery stents are present. IMPRESSION: 1. Chronic changes on chest x-ray.  No active lung disease. 2. No bowel obstruction.  No free air. Electronically Signed   By: Ivar Drape M.D.   On: 08/23/2016 16:58        Filed Weights   08/23/16 1302 08/24/16 0647 08/25/16 0525  Weight: 59.4 kg (131 lb) 59 kg (130 lb 1.6 oz) 58.6 kg (129 lb 1.6 oz)     Microbiology: No results found for this or any previous visit (from the past 240 hour(s)).     Blood Culture No results found for: SDES, SPECREQUEST, CULT, REPTSTATUS    Labs: Results for orders placed or performed during the hospital encounter of  08/23/16 (from the past 48 hour(s))  Comprehensive metabolic panel     Status: Abnormal   Collection Time: 08/23/16  2:51 PM  Result Value Ref Range   Sodium 143 135 - 145 mmol/L   Potassium 4.3 3.5 - 5.1 mmol/L   Chloride 112 (H) 101 - 111 mmol/L   CO2 26 22 - 32 mmol/L   Glucose, Bld 81 65 - 99 mg/dL   BUN 24 (H) 6 - 20 mg/dL   Creatinine, Ser 1.30 (H) 0.61 - 1.24 mg/dL   Calcium 9.3 8.9 - 10.3 mg/dL   Total Protein 4.7 (L) 6.5 - 8.1 g/dL   Albumin 2.8 (L) 3.5 - 5.0 g/dL   AST 21 15 - 41 U/L   ALT 18 17 - 63 U/L   Alkaline Phosphatase 44 38 - 126 U/L   Total Bilirubin 0.8 0.3 - 1.2 mg/dL  GFR calc non Af Amer 48 (L) >60 mL/min   GFR calc Af Amer 56 (L) >60 mL/min    Comment: (NOTE) The eGFR has been calculated using the CKD EPI equation. This calculation has not been validated in all clinical situations. eGFR's persistently <60 mL/min signify possible Chronic Kidney Disease.    Anion gap 5 5 - 15  Troponin I     Status: Abnormal   Collection Time: 08/23/16  2:51 PM  Result Value Ref Range   Troponin I 0.03 (HH) <0.03 ng/mL    Comment: CRITICAL RESULT CALLED TO, READ BACK BY AND VERIFIED WITH: UKalman Shan RN [phone] GREEN R   Type and screen [place]     Status: None   Collection Time: 08/23/16  2:57 PM  Result Value Ref Range   ABO/RH(D) A POS    Antibody Screen NEG    Sample Expiration 08/26/2016   CBC     Status: Abnormal   Collection Time: 08/23/16  8:15 PM  Result Value Ref Range   WBC 6.1 4.0 - 10.5 K/uL   RBC 3.55 (L) 4.22 - 5.81 MIL/uL   Hemoglobin 10.4 (L) 13.0 - 17.0 g/dL   HCT 32.6 (L) 39.0 - 52.0 %   MCV 91.8 78.0 - 100.0 fL   MCH 29.3 26.0 - 34.0 pg   MCHC 31.9 30.0 - 36.0 g/dL   RDW 16.6 (H) 11.5 - 15.5 %   Platelets 161 150 - 400 K/uL  Troponin I (q 6hr x 3)     Status: Abnormal   Collection Time: 08/23/16  8:15 PM  Result Value Ref Range   Troponin I 0.03 (HH) <0.03 ng/mL    Comment: CRITICAL VALUE NOTED.  VALUE IS  CONSISTENT WITH PREVIOUSLY REPORTED AND CALLED VALUE.  CBC     Status: Abnormal   Collection Time: 08/24/16  2:12 AM  Result Value Ref Range   WBC 5.0 4.0 - 10.5 K/uL   RBC 3.33 (L) 4.22 - 5.81 MIL/uL   Hemoglobin 9.5 (L) 13.0 - 17.0 g/dL   HCT 30.6 (L) 39.0 - 52.0 %   MCV 91.9 78.0 - 100.0 fL   MCH 28.5 26.0 - 34.0 pg   MCHC 31.0 30.0 - 36.0 g/dL   RDW 16.3 (H) 11.5 - 15.5 %   Platelets 159 150 - 400 K/uL  Comprehensive metabolic panel     Status: Abnormal   Collection Time: 08/24/16  2:12 AM  Result Value Ref Range   Sodium 142 135 - 145 mmol/L   Potassium 4.2 3.5 - 5.1 mmol/L   Chloride 113 (H) 101 - 111 mmol/L   CO2 26 22 - 32 mmol/L   Glucose, Bld 71 65 - 99 mg/dL   BUN 21 (H) 6 - 20 mg/dL   Creatinine, Ser 1.32 (H) 0.61 - 1.24 mg/dL   Calcium 9.0 8.9 - 10.3 mg/dL   Total Protein 4.6 (L) 6.5 - 8.1 g/dL   Albumin 2.6 (L) 3.5 - 5.0 g/dL   AST 19 15 - 41 U/L   ALT 16 (L) 17 - 63 U/L   Alkaline Phosphatase 44 38 - 126 U/L   Total Bilirubin 0.6 0.3 - 1.2 mg/dL   GFR calc non Af Amer 47 (L) >60 mL/min   GFR calc Af Amer 55 (L) >60 mL/min    Comment: (NOTE) The eGFR has been calculated using the CKD EPI equation. This calculation has not been validated in all clinical situations. eGFR's persistently <60  mL/min signify possible Chronic Kidney Disease.    Anion gap 3 (L) 5 - 15  Protime-INR     Status: Abnormal   Collection Time: 08/24/16  2:12 AM  Result Value Ref Range   Prothrombin Time 15.7 (H) 11.4 - 15.2 seconds   INR 1.24   Troponin I (q 6hr x 3)     Status: Abnormal   Collection Time: 08/24/16  2:12 AM  Result Value Ref Range   Troponin I 0.03 (HH) <0.03 ng/mL    Comment: CRITICAL VALUE NOTED.  VALUE IS CONSISTENT WITH PREVIOUSLY REPORTED AND CALLED VALUE.  CBC     Status: Abnormal   Collection Time: 08/24/16 10:16 AM  Result Value Ref Range   WBC 6.5 4.0 - 10.5 K/uL   RBC 3.61 (L) 4.22 - 5.81 MIL/uL   Hemoglobin 10.3 (L) 13.0 - 17.0 g/dL   HCT 33.4 (L) 39.0  - 52.0 %   MCV 92.5 78.0 - 100.0 fL   MCH 28.5 26.0 - 34.0 pg   MCHC 30.8 30.0 - 36.0 g/dL   RDW 16.2 (H) 11.5 - 15.5 %   Platelets 177 150 - 400 K/uL  CBC     Status: Abnormal   Collection Time: 08/24/16  8:52 PM  Result Value Ref Range   WBC 4.8 4.0 - 10.5 K/uL   RBC 3.44 (L) 4.22 - 5.81 MIL/uL   Hemoglobin 10.0 (L) 13.0 - 17.0 g/dL   HCT 31.2 (L) 39.0 - 52.0 %   MCV 90.7 78.0 - 100.0 fL   MCH 29.1 26.0 - 34.0 pg   MCHC 32.1 30.0 - 36.0 g/dL   RDW 16.5 (H) 11.5 - 15.5 %   Platelets 174 150 - 400 K/uL  Comprehensive metabolic panel     Status: Abnormal   Collection Time: 08/25/16  5:28 AM  Result Value Ref Range   Sodium 142 135 - 145 mmol/L   Potassium 4.3 3.5 - 5.1 mmol/L   Chloride 110 101 - 111 mmol/L   CO2 26 22 - 32 mmol/L   Glucose, Bld 91 65 - 99 mg/dL   BUN 20 6 - 20 mg/dL   Creatinine, Ser 1.57 (H) 0.61 - 1.24 mg/dL   Calcium 9.1 8.9 - 10.3 mg/dL   Total Protein 5.2 (L) 6.5 - 8.1 g/dL   Albumin 2.9 (L) 3.5 - 5.0 g/dL   AST 24 15 - 41 U/L   ALT 18 17 - 63 U/L   Alkaline Phosphatase 50 38 - 126 U/L   Total Bilirubin 0.6 0.3 - 1.2 mg/dL   GFR calc non Af Amer 39 (L) >60 mL/min   GFR calc Af Amer 45 (L) >60 mL/min    Comment: (NOTE) The eGFR has been calculated using the CKD EPI equation. This calculation has not been validated in all clinical situations. eGFR's persistently <60 mL/min signify possible Chronic Kidney Disease.    Anion gap 6 5 - 15     Lipid Panel  No results found for: CHOL, TRIG, HDL, CHOLHDL, VLDL, LDLCALC, LDLDIRECT   Lab Results  Component Value Date   HGBA1C 5.7 (H) 11/17/2013      HPI :  Glenn Reilly is a 83 y.o. male who is being seen today for the evaluation of GI bleed on plavix at the request of Dr. Nada Maclachlan. He has hx of CAD s/p CABG 11/20/2013 LIMA to LAD and SVG to circumflex, PAD s/p bilateral iliac stenting remotely, carotid artery disease, factor V Leiden mutation, heterozygous,  hypertension, hyperlipidemia, h/o lung  CA s/p R lower lobectomyand COPD.transferred from [facility] for evaluation of melanotic stools. In review, patient had a recent catheterization at [facility] for 3 V disease with total occlusion of the pLAD, requiring dual antiplatelet therapy for 12 months with Plavix. However, he was only able to take 2 doses, as he began to experience multiple melanotic stools today.  GI and cardiology were consulted  HOSPITAL COURSE:   Lower GI bleed Likely due to aspirin/Plavix  Gastroenterology was consulted, they suspected minor gastric erosion, ulcer, AVM Telemetry uneventful Hemoglobin 12.8>10.4>9.5>10.0 prior to discharge INR 1.1 Patient did not require any transfusions Per GI patient has a history of upper GI bleeding-started on PPI twice a day,  PPI twice daily for 2 weeks then once daily  Hold all NSAIDs including preadmission aspirin Cardiology consulted to help with antiplatelet therapy in the setting of GI bleeding, aspirin on hold for 1 week, okay with Dr. Gwenlyn Found to continue Plavix,He is at high risk" stent thrombosis" with stopping antiplatelet therapy with recently placed drug-eluting stent.   CAD s/p cardiac cath with stent on 4/9 ,  patient is cardiac pain free at this time. Cardiology was consulted during this admission   Chronic kidney disease stage 3 Current Cr 1.27, at baseline  Recent Labs       Lab Results  Component Value Date   CREATININE 1.27 (H) 08/20/2016   CREATININE 1.64 (H) 08/15/2016   CREATININE 1.20 (H) 02/01/2015     Creatinine slightly increased from 1.32>1.57 Repeat CMET in am   Lung cancer. [place] RUL lobectomy 2001. Radiation for LUL squamous cell cancer 2015. Recurrence of LUL cancer this 05/2016, being followed by oncology/pulmonary Dr Hinton Rao.    COPD without exacerbation  Continue home meds   Hypothyroidism: Continue home Synthroid  History of F V leiden mutation/ History of stroke . Patient was on ASA and PLavix,  aspirin has a one-week and Plavix has been restarted in the setting of GIB.       Discharge Exam:   Blood pressure 138/63, pulse 75, temperature 98 F (36.7 C), temperature source Oral, resp. rate 16, height '5\' 8"'  (1.727 m), weight 58.6 kg (129 lb 1.6 oz), SpO2 100 %.   General exam: Appears calm and comfortable  Respiratory system: Clear to auscultation. Respiratory effort normal. Cardiovascular system: S1 & S2 heard, RRR. No JVD, murmurs, rubs, gallops or clicks. No pedal edema. Gastrointestinal system: Abdomen is nondistended, soft and nontender. No organomegaly or masses felt. Normal bowel sounds heard. Central nervous system: Alert and oriented. No focal neurological deficits. Extremities: Symmetric 5 x 5 power. Skin: No rashes, lesions or ulcers Psychiatry: Judgement and insight appear normal. Mood & affect appropriate.   Follow-up Information    Townsend Roger, MD. Call.   Specialty:  Internal Medicine Why:  Hospital follow-up in 3-5 days Contact information: [address] [place] [address] [phone]           Signed: Reyne Dumas 08/25/2016, 8:31 AM        Time spent >45 mins

## 2016-08-27 ENCOUNTER — Ambulatory Visit: Payer: Medicare Other | Admitting: Physician Assistant

## 2016-08-27 DIAGNOSIS — D649 Anemia, unspecified: Secondary | ICD-10-CM | POA: Diagnosis not present

## 2016-08-27 DIAGNOSIS — I2510 Atherosclerotic heart disease of native coronary artery without angina pectoris: Secondary | ICD-10-CM | POA: Diagnosis not present

## 2016-09-03 DIAGNOSIS — D649 Anemia, unspecified: Secondary | ICD-10-CM | POA: Diagnosis not present

## 2016-09-03 DIAGNOSIS — I2510 Atherosclerotic heart disease of native coronary artery without angina pectoris: Secondary | ICD-10-CM | POA: Diagnosis not present

## 2016-09-03 DIAGNOSIS — M2540 Effusion, unspecified joint: Secondary | ICD-10-CM | POA: Diagnosis not present

## 2016-09-05 DIAGNOSIS — I509 Heart failure, unspecified: Secondary | ICD-10-CM | POA: Diagnosis not present

## 2016-09-05 DIAGNOSIS — Z77090 Contact with and (suspected) exposure to asbestos: Secondary | ICD-10-CM | POA: Diagnosis not present

## 2016-09-05 DIAGNOSIS — R970 Elevated carcinoembryonic antigen [CEA]: Secondary | ICD-10-CM | POA: Diagnosis not present

## 2016-09-05 DIAGNOSIS — D509 Iron deficiency anemia, unspecified: Secondary | ICD-10-CM | POA: Diagnosis not present

## 2016-09-05 DIAGNOSIS — C3412 Malignant neoplasm of upper lobe, left bronchus or lung: Secondary | ICD-10-CM | POA: Diagnosis not present

## 2016-09-05 DIAGNOSIS — E538 Deficiency of other specified B group vitamins: Secondary | ICD-10-CM | POA: Diagnosis not present

## 2016-09-05 DIAGNOSIS — Z8673 Personal history of transient ischemic attack (TIA), and cerebral infarction without residual deficits: Secondary | ICD-10-CM | POA: Diagnosis not present

## 2016-09-05 DIAGNOSIS — K922 Gastrointestinal hemorrhage, unspecified: Secondary | ICD-10-CM | POA: Diagnosis not present

## 2016-09-05 DIAGNOSIS — Z85118 Personal history of other malignant neoplasm of bronchus and lung: Secondary | ICD-10-CM | POA: Diagnosis not present

## 2016-09-05 DIAGNOSIS — M810 Age-related osteoporosis without current pathological fracture: Secondary | ICD-10-CM | POA: Diagnosis not present

## 2016-09-05 DIAGNOSIS — N189 Chronic kidney disease, unspecified: Secondary | ICD-10-CM | POA: Diagnosis not present

## 2016-09-05 DIAGNOSIS — D631 Anemia in chronic kidney disease: Secondary | ICD-10-CM | POA: Diagnosis not present

## 2016-09-05 DIAGNOSIS — I2510 Atherosclerotic heart disease of native coronary artery without angina pectoris: Secondary | ICD-10-CM | POA: Diagnosis not present

## 2016-09-07 ENCOUNTER — Encounter: Payer: Self-pay | Admitting: Cardiovascular Disease

## 2016-09-07 ENCOUNTER — Other Ambulatory Visit: Payer: Self-pay | Admitting: Cardiovascular Disease

## 2016-09-07 ENCOUNTER — Ambulatory Visit (INDEPENDENT_AMBULATORY_CARE_PROVIDER_SITE_OTHER): Payer: Medicare Other | Admitting: Cardiovascular Disease

## 2016-09-07 DIAGNOSIS — I200 Unstable angina: Secondary | ICD-10-CM | POA: Diagnosis not present

## 2016-09-07 DIAGNOSIS — I779 Disorder of arteries and arterioles, unspecified: Secondary | ICD-10-CM

## 2016-09-07 DIAGNOSIS — I739 Peripheral vascular disease, unspecified: Secondary | ICD-10-CM

## 2016-09-07 DIAGNOSIS — I2510 Atherosclerotic heart disease of native coronary artery without angina pectoris: Secondary | ICD-10-CM | POA: Diagnosis not present

## 2016-09-07 DIAGNOSIS — I10 Essential (primary) hypertension: Secondary | ICD-10-CM

## 2016-09-07 NOTE — Assessment & Plan Note (Signed)
History of coronary artery disease status post remote PCI in 1999 and again in 2004 by Dr. Jane Canary. He also underwent coronary artery bypass grafting by Dr. Roxy Manns 11/20/13 with a LIMA to his LAD and a vein to the circumflex. He saw Geraldine Solar in the office 08/15/16 with left arm pain similar to his previous symptoms and ultimately underwent cardiac catheterization performed by Dr. Burt Knack 08/20/16 revealing a 80% "in-stent restenosis" in the proximal to mid RCA stent which was then restented. His grafts were intact. He has no further symptoms.

## 2016-09-07 NOTE — Progress Notes (Signed)
09/07/2016 Glenn Reilly   08/30/1928  [id number]  Primary Physician Townsend Roger, MD Primary Cardiologist: Lorretta Harp MD Renae Gloss  HPI:  Glenn Reilly is an 83 year old thin appearing widowed Caucasian male father of one son who accompanies him today. I last saw him in the office 12/01/15.Marland Kitchen He was referred by Dr. Vassie Loll Eyk His cardiac risk factor profile is remarkable for remote tobacco abuse, hyperlipidemia and hypertension. He has never had a heart attack but did have a stroke in 2011 at the time of left carotid endarterectomy. He said coronary artery disease with stenting in 1999 performed by Dr. Eustace Quail and again 2004. His left calf because k patent stent with moderate LAD and circumflex disease. He does have diastolic dysfunction. He has had lung cancer and right lung resection by Dr. Baldemar Friday in 2001. On Thursday night after mowing his lawn on a riding lower developed back pain rating to his chest associated with dyspnea on exertion. This resolved throughout the night has not recurred. He did have apparently inferior T-wave inversion on EKG today which was new. A Myoview stress test was normal as was a 2-D echo echocardiogram. Lower extremity arterial Doppler studies showed ankle-brachial indices of greater than one bilaterally with a suggestion of mild to moderate iliac disease. He developed exertional left upper extremity discomfort with dyspnea over the last month which is fairly reproducible and similar to his prior symptoms before coronary intervention. Based on this, I performed outpatient cardiac catheterization on 11/05/13 in the right radial approach. I demonstrated 50% left main, high-grade proximal LAD, ostial circumflex and moderate mid RCA disease with normal LV function. Based on this, I suggested coronary artery bypass grafting is the best revascularization strategy especially in light of his pulmonary nodule which could potentially be biopsied at  the time of surgery. This was performed by Dr. Roxy Manns on 11/20/13 with a LIMA to his LAD and a vein to the circumflex coronary artery. He also had a Tru-Cut needle biopsy of his left upper lobe nodule which turned out to be some squamous cell carcinoma. He has received radiation therapy for this. He did participate in cardiac rehabilitation. A chest CT showed diminished size of his nodule. He was hospitalized for pneumonia and was told he had "congestive heart failure. A BNP was mildly elevated. He did have lower extremity edema and his Lasix was increased. A subsequent 2-D echo performed in March of last year showed preserved LV function with diastolic dysfunction. He saw Almyra Deforest [facility] in the office recently complaining of chest pain and a Myoview stress test was performed which showed normal LV systolic function with normal perfusion. His chest pain has subsequently resolved. He was hospitalized at [facility] last month with pneumonia and flu. He had some increased lotion edema which responded to adjusting his diuretic dose. Since I saw him in the office a year ago he did develop left upper extremity discomfort similar to his previous coronary symptoms and saw Almyra Deforest in the office on 08/15/16 range for him to undergo outpatient cardiac cath by Dr. Burt Knack 08/20/16. He was found to have an 80% "in-stent restenosis" within the previously placed RCA stent which was restented. He was discharged on the same day. His symptoms have resolved.   Current Outpatient Prescriptions  Medication Sig Dispense Refill  . albuterol (PROVENTIL HFA;VENTOLIN HFA) 108 (90 BASE) MCG/ACT inhaler Inhale 2 puffs into the lungs every 6 (six) hours as needed for  wheezing or shortness of breath.    Marland Kitchen amLODipine (NORVASC) 5 MG tablet Take 1 tablet (5 mg total) by mouth daily. 30 tablet 3  . aspirin 81 MG tablet Take 1 tablet (81 mg total) by mouth at bedtime. 30 tablet 0  . clopidogrel (PLAVIX) 75 MG tablet Take 1 tablet (75 mg total)  by mouth daily. 30 tablet 0  . Coenzyme Q10 (CO Q 10 PO) Take 1 tablet by mouth daily.    . furosemide (LASIX) 40 MG tablet Take 1 tablet (40 mg total) by mouth daily. (Patient taking differently: Take 20 mg by mouth daily. ) 30 tablet   . ipratropium (ATROVENT) 0.03 % nasal spray Place 2 sprays into both nostrils 2 (two) times daily as needed for allergies.  5  . ipratropium-albuterol (DUONEB) 0.5-2.5 (3) MG/3ML SOLN Take 3 mLs by nebulization 4 (four) times daily.     Marland Kitchen levothyroxine (SYNTHROID, LEVOTHROID) 75 MCG tablet Take 75 mcg by mouth daily before breakfast.    . lisinopril (PRINIVIL,ZESTRIL) 20 MG tablet Take 1 tablet (20 mg total) by mouth daily. 30 tablet 3  . mometasone-formoterol (DULERA) 200-5 MCG/ACT AERO Inhale 2 puffs into the lungs 2 (two) times daily.    . montelukast (SINGULAIR) 10 MG tablet Take 10 mg by mouth at bedtime.    . pantoprazole (PROTONIX) 40 MG tablet Take 1 tablet (40 mg total) by mouth 2 (two) times daily. 60 tablet 1  . predniSONE (DELTASONE) 10 MG tablet Take 10 mg by mouth daily with breakfast.     No current facility-administered medications for this visit.     Allergies  Allergen Reactions  . Demerol [Meperidine] Anaphylaxis  . Statins Other (See Comments)    Myalgia     Social History   Social History  . Marital status: Widowed    Spouse name: N/A  . Number of children: 1  . Years of education: N/A   Occupational History  . 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 Smoking status: Former Smoker    Packs/day: 1.00    Years: 30.00    Types: Cigarettes    Quit date: 03/03/1979  . Smokeless tobacco: Current User    Types: Chew  . Alcohol use No  . Drug use: No  . Sexual activity: Not on file   Other Topics Concern  . Not on file   Social History Narrative  . No narrative on file     Review of Systems: General: negative for chills, fever, night sweats or weight changes.    Cardiovascular: negative for chest pain, dyspnea on exertion, edema, orthopnea, palpitations, paroxysmal nocturnal dyspnea or shortness of breath Dermatological: negative for rash Respiratory: negative for cough or wheezing Urologic: negative for hematuria Abdominal: negative for nausea, vomiting, diarrhea, bright red blood per rectum, melena, or hematemesis Neurologic: negative for visual changes, syncope, or dizziness All other systems reviewed and are otherwise negative except as noted above.    Blood pressure 122/60, pulse 80, height '5\' 8"'$  (1.727 m), weight 132 lb (59.9 kg).  General appearance: alert and no distress Neck: no adenopathy, no carotid bruit, no JVD, supple, symmetrical, trachea midline and thyroid not enlarged, symmetric, no tenderness/mass/nodules Lungs: clear to auscultation bilaterally Heart: regular rate and rhythm, S1, S2 normal, no murmur, click, rub or gallop Extremities: extremities normal, atraumatic, no cyanosis or edema  EKG not performed today  ASSESSMENT AND PLAN:   HYPERLIPIDEMIA-MIXED History of hyperlipidemia  on red yeast rice followed by his PCP  HYPERTENSION, BENIGN History of hypertension blood pressure measured at 120/60. He is on amlodipine, and lisinopril. Continue current meds at current dosing  CAD, NATIVE VESSEL History of coronary artery disease status post remote PCI in 1999 and again in 2004 by Dr. Jane Canary. He also underwent coronary artery bypass grafting by Dr. Roxy Manns 11/20/13 with a LIMA to his LAD and a vein to the circumflex. He saw Geraldine Solar in the office 08/15/16 with left arm pain similar to his previous symptoms and ultimately underwent cardiac catheterization performed by Dr. Burt Knack 08/20/16 revealing a 80% "in-stent restenosis" in the proximal to mid RCA stent which was then restented. His grafts were intact. He has no further symptoms.  Peripheral vascular disease History of peripheral arterial disease status post iliac stenting in  the past.  Carotid artery disease History of carotid artery disease status post carotid endarterectomy performed remotely. His last carotid Dopplers performed 02/01/15 revealed a widely patent left endarterectomy site moderate right ICA stenosis which we will need to repeat.      Lorretta Harp MD FACP,FACC,FAHA, [place] 09/07/2016 10:05 AM

## 2016-09-07 NOTE — Assessment & Plan Note (Signed)
History of peripheral arterial disease status post iliac stenting in the past.

## 2016-09-07 NOTE — Assessment & Plan Note (Signed)
History of carotid artery disease status post carotid endarterectomy performed remotely. His last carotid Dopplers performed 02/01/15 revealed a widely patent left endarterectomy site moderate right ICA stenosis which we will need to repeat.

## 2016-09-07 NOTE — Patient Instructions (Signed)

## 2016-09-07 NOTE — Assessment & Plan Note (Signed)
History of hyperlipidemia on red yeast rice followed by his PCP 

## 2016-09-07 NOTE — Assessment & Plan Note (Signed)
History of hypertension blood pressure measured at 120/60. He is on amlodipine, and lisinopril. Continue current meds at current dosing

## 2016-09-12 ENCOUNTER — Ambulatory Visit: Payer: Medicare Other | Admitting: Cardiovascular Disease

## 2016-09-12 DIAGNOSIS — C3412 Malignant neoplasm of upper lobe, left bronchus or lung: Secondary | ICD-10-CM | POA: Diagnosis not present

## 2016-09-12 DIAGNOSIS — D509 Iron deficiency anemia, unspecified: Secondary | ICD-10-CM | POA: Diagnosis not present

## 2016-09-18 DIAGNOSIS — M19041 Primary osteoarthritis, right hand: Secondary | ICD-10-CM | POA: Diagnosis not present

## 2016-09-18 DIAGNOSIS — M79641 Pain in right hand: Secondary | ICD-10-CM | POA: Diagnosis not present

## 2016-09-18 DIAGNOSIS — M109 Gout, unspecified: Secondary | ICD-10-CM | POA: Diagnosis not present

## 2016-09-18 DIAGNOSIS — M7989 Other specified soft tissue disorders: Secondary | ICD-10-CM | POA: Diagnosis not present

## 2016-09-18 DIAGNOSIS — M2550 Pain in unspecified joint: Secondary | ICD-10-CM | POA: Diagnosis not present

## 2016-09-18 DIAGNOSIS — M1990 Unspecified osteoarthritis, unspecified site: Secondary | ICD-10-CM | POA: Diagnosis not present

## 2016-09-18 DIAGNOSIS — M19042 Primary osteoarthritis, left hand: Secondary | ICD-10-CM | POA: Diagnosis not present

## 2016-09-19 DIAGNOSIS — D509 Iron deficiency anemia, unspecified: Secondary | ICD-10-CM | POA: Diagnosis not present

## 2016-10-11 DIAGNOSIS — M109 Gout, unspecified: Secondary | ICD-10-CM | POA: Diagnosis not present

## 2016-10-11 DIAGNOSIS — M7989 Other specified soft tissue disorders: Secondary | ICD-10-CM | POA: Diagnosis not present

## 2016-10-11 DIAGNOSIS — M1990 Unspecified osteoarthritis, unspecified site: Secondary | ICD-10-CM | POA: Diagnosis not present

## 2016-10-11 DIAGNOSIS — E790 Hyperuricemia without signs of inflammatory arthritis and tophaceous disease: Secondary | ICD-10-CM | POA: Diagnosis not present

## 2016-10-11 DIAGNOSIS — M79641 Pain in right hand: Secondary | ICD-10-CM | POA: Diagnosis not present

## 2016-10-22 ENCOUNTER — Other Ambulatory Visit: Payer: Self-pay | Admitting: Radiation Oncology

## 2016-10-22 DIAGNOSIS — R911 Solitary pulmonary nodule: Secondary | ICD-10-CM | POA: Diagnosis not present

## 2016-10-22 DIAGNOSIS — C3492 Malignant neoplasm of unspecified part of left bronchus or lung: Secondary | ICD-10-CM | POA: Diagnosis not present

## 2016-10-22 DIAGNOSIS — C3491 Malignant neoplasm of unspecified part of right bronchus or lung: Secondary | ICD-10-CM

## 2016-10-23 NOTE — Progress Notes (Signed)
Department of Radiation Oncology  Phone:  [phone] Fax:        [phone]   Name: Glenn Reilly MRN: [id number]  DOB: 1929/03/25  Date: 10/24/2016  Follow Up Visit Note:  Diagnosis: History of Stage I NSCLC, squamous cell carcinoma of the LUL and Stage IA NSCLC of the RUL  Narrative: Glenn Reilly, is a pleasant 83 y.o. gentleman with a history of a Stage IA NSCLC, large cell, of the RUL treated with prior lobectomy in 2001, and with a history of Stage IA NSCLC, squamous cell carcinoma, of the LUL S/P SBRT with Dr. Pablo Ledger in 2015. He has been followed in surveillance with Dr. Hinton Rao in [place]. He has been followed closely for an elevated CEA between 5-7.6 in the past two years. He was found to have an elevated CEA of 8.5 ng/mL on 06/07/2016. Accordingly the patient underwent CT chest with contrast on 06/11/2016 which revealed a "new mass-like opacity in the left upper lobe in the area of prior radiation therapy." He had a PET at [facility] in February 2018 and the lesion seen on CT has an SUV of 2.5 in the left hilum and a right chronic effusion that did not appear hypermetabolic. He met with Dr. Lisbeth Renshaw on 06/21/16 to review his PET scan and we discussed close follow up evaluation with repeat CT in 4 months. He went for this study yesterday and this revealed the lesion measuring 2.4 x 2.8 cm and similar in appearance to previous scans when compared to his CTs and PET scan. He comes to review this today. Of note his blood work was drawn this morning and he is going to be worked up for a Hgb of 9.5. He had a CEA drawn but this won't result until tomorrow.  Dosimetry records from prior treatment   Summary and Interval since last radiation: 2 years, 9 months  01/12/2014-01/19/2014 SBRT Treatment:  54 Gy in 3 fractions to the left upper lobe   Past Medical History:  Past Medical History:  Diagnosis Date  . Asbestosis ([place])   . Asthma   . CAD, [place] PCI 2004  Cath 11/05/2013 with 50% LMain, 75% prox LAD, 80% prox LCx, 50% RCA with normal EF  08/20/16 Cath with PCI DES--> RCA  . Carotid artery disease ([place])   . Cataract   . Complication of anesthesia    lt side throat parlysis  . COPD with emphysema ([place]) 01/13/2010   Spirometry 10/19/2013 is reviewed. This reveals FEV1 44% predicted FVC 47% predicted and FEF 25 7537% predicted   . Detached retina   . Dysphagia as late effect of stroke    Developed as complication of stroke following left carotid endarterectomy with documented left hypoglossal nerve paralysis   . Factor 5 Leiden mutation, heterozygous ([place])   . GI bleed 08/2016  . 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 HTN (hypertension)   . Hyperlipemia   . HYPERLIPIDEMIA-MIXED 01/13/2010   Qualifier: Diagnosis of  By: Olevia Perches, MD, Glenetta Hew   . HYPERTENSION, BENIGN 01/13/2010   Qualifier: Diagnosis of  By: Olevia Perches, MD, Glenetta Hew   . Hypothyroidism   . Lung cancer ([place]) 2001   lower right lobe surgery to remove cancer, no chemo or radiation  . Lung mass 10/19/2013  CT of the chest 10/12/2013 reveals an enlarging left upper lobe irregular nodule. Now 1.4 compared to 1.0 cm in diameter when compared to March 2015 CT scan. There is calcified pleural plaques seen bilaterally. There is subpleural scarring seen as well. Previous right lower lobectomy has been demonstrated.  PET-CT scan from June 2015 is reviewed this shows increased uptake (SUV > 8) in left upper   . PVD 01/13/2010   Qualifier: Diagnosis of  By: Olevia Perches, MD, Glenetta Hew   . PVD (peripheral vascular disease) ([place]) 2011   carotid endarterectomy, pv stents  . Radiation 9/1, 9/3, 01/19/14   Left upper lung 3 fractions  . S/P CABG x 2 11/20/2013   Off-pump CABG x2 using LIMA to LAD, SVG to OM1, EVH via right thigh  . Stroke  [facility]) 07/2009   left carotid endarterectomy    Past Surgical History: Past Surgical History:  Procedure Laterality Date  . APPENDECTOMY    . BIOPSY Left 11/20/2013   Procedure: BIOPSY Left Upper Lobe;  Surgeon: Rexene Alberts, MD;  Location: [place];  Service: Open Heart Surgery;  Laterality: Left;  . CAROTID ENDARTERECTOMY Left 2011   [facility] - complicated by post-op stroke  . CORONARY ARTERY BYPASS GRAFT N/A 11/20/2013   Procedure: OFF PUMP CORONARY ARTERY BYPASS GRAFTING (CABG) x2: LIMA to LAD, SVG to Obtuse Marginal 1.;  Surgeon: Rexene Alberts, MD;  Location: [place];  Service: Open Heart Surgery;  Laterality: N/A;  . CORONARY STENT INTERVENTION N/A 08/20/2016   Procedure: Coronary Stent Intervention;  Surgeon: Sherren Mocha, MD;  Location: [place] CV LAB;  Service: Cardiovascular;  Laterality: N/A;  . FRACTURE SURGERY Left    foot  . HEMORRHOID SURGERY    . ILIAC ARTERY STENT Bilateral 2011   [facility] - performed for asymptomatic iliac artery disease  . INTRAOPERATIVE TRANSESOPHAGEAL ECHOCARDIOGRAM N/A 11/20/2013   Procedure: INTRAOPERATIVE TRANSESOPHAGEAL ECHOCARDIOGRAM;  Surgeon: Rexene Alberts, MD;  Location: [place];  Service: Open Heart Surgery;  Laterality: N/A;  . LEFT HEART CATH AND CORONARY ANGIOGRAPHY N/A 08/20/2016   Procedure: Left Heart Cath and Coronary Angiography;  Surgeon: Sherren Mocha, MD;  Location: [place] CV LAB;  Service: Cardiovascular;  Laterality: N/A;  . LEFT HEART CATHETERIZATION WITH CORONARY ANGIOGRAM N/A 11/05/2013   Procedure: LEFT HEART CATHETERIZATION WITH CORONARY ANGIOGRAM;  Surgeon: Lorretta Harp, MD;  Location: [facility] CATH LAB;  Service: Cardiovascular;  Laterality: N/A;  . 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 PERCUTANEOUS CORONARY STENT INTERVENTION (PCI-S)  1999   RCA  . PROSTATE SURGERY     TURP  . VIDEO ASSISTED THORACOSCOPY  (VATS)/EMPYEMA Right 07/13/1999   drainage of post-operative empyema following RLLobectomy    Social History:  Social History   Social History  . Marital status: Widowed    Spouse name: N/A  . Number of children: 1  . Years of education: N/A   Occupational History  . 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 Smoking status: Former Smoker    Packs/day: 1.00    Years: 30.00    Types: Cigarettes    Quit date: 03/03/1979  . Smokeless tobacco: Current User    Types: Chew  . Alcohol use No  . Drug use: No  . Sexual activity: Not on file   Other Topics Concern  .  Not on file   Social History Narrative  . No narrative on file  The patient is widowed and comes with his son and daughter in law.  Family History: Family History  Problem Relation Age of Onset  . Heart attack Mother   . Heart attack Father   . Cancer Brother        esophagus and lung  . Cancer Brother        lung   Medications: Current Outpatient Prescriptions  Medication Sig Dispense Refill  . albuterol (PROVENTIL HFA;VENTOLIN HFA) 108 (90 BASE) MCG/ACT inhaler Inhale 2 puffs into the lungs every 6 (six) hours as needed for wheezing or shortness of breath.    Marland Kitchen amLODipine (NORVASC) 5 MG tablet Take 1 tablet (5 mg total) by mouth daily. 30 tablet 3  . aspirin 81 MG tablet Take 1 tablet (81 mg total) by mouth at bedtime. 30 tablet 0  . clopidogrel (PLAVIX) 75 MG tablet Take 1 tablet (75 mg total) by mouth daily. 30 tablet 0  . Coenzyme Q10 (CO Q 10 PO) Take 1 tablet by mouth daily.    . furosemide (LASIX) 40 MG tablet Take 1 tablet (40 mg total) by mouth daily. (Patient taking differently: Take 20 mg by mouth daily. ) 30 tablet   . ipratropium (ATROVENT) 0.03 % nasal spray Place 2 sprays into both nostrils 2 (two) times daily as needed for allergies.  5  . ipratropium-albuterol (DUONEB) 0.5-2.5 (3) MG/3ML SOLN Take 3 mLs by nebulization 4 (four) times daily.     Marland Kitchen  levothyroxine (SYNTHROID, LEVOTHROID) 75 MCG tablet Take 75 mcg by mouth daily before breakfast.    . lisinopril (PRINIVIL,ZESTRIL) 20 MG tablet Take 1 tablet (20 mg total) by mouth daily. 30 tablet 3  . mometasone-formoterol (DULERA) 200-5 MCG/ACT AERO Inhale 2 puffs into the lungs 2 (two) times daily.    . montelukast (SINGULAIR) 10 MG tablet Take 10 mg by mouth at bedtime.    . pantoprazole (PROTONIX) 40 MG tablet Take 1 tablet (40 mg total) by mouth 2 (two) times daily. 60 tablet 1  . predniSONE (DELTASONE) 10 MG tablet Take 10 mg by mouth daily with breakfast.     No current facility-administered medications for this encounter.     Allergies:  Allergies  Allergen Reactions  . Demerol [Meperidine] Anaphylaxis  . Statins Other (See Comments)    Myalgia       Physical Exam:  Wt Readings from Last 3 Encounters:  09/07/16 132 lb (59.9 kg)  08/25/16 129 lb 1.6 oz (58.6 kg)  08/20/16 132 lb (59.9 kg)   Temp Readings from Last 3 Encounters:  08/25/16 98 F (36.7 C) (Oral)  08/20/16 97.8 F (36.6 C) (Oral)  06/21/16 97.8 F (36.6 C) (Oral)   BP Readings from Last 3 Encounters:  09/07/16 122/60  08/25/16 138/63  08/20/16 (!) 183/78   Pulse Readings from Last 3 Encounters:  09/07/16 80  08/25/16 75  08/20/16 69    In general this is a well appearing caucasian male in no acute distress. He is alert and oriented x4 and appropriate throughout the examination. HEENT reveals that the patient is normocephalic, atraumatic. EOMs are intact. PERRLA. Skin is intact without any evidence of gross lesions but is still notable for bruising and hemosiderin deposition of the upper extremities. He has edema of the right hand consistent with his known history of gout. Cardiopulmonary assessment is negative for acute distress and he exhibits normal effort.  IMPRESSION/PLAN: 1.  Stage IA, NSCLC, squamous cell carcinoma of the left upper lobe. The patient appears to be clinically and  radiographically stable. We discussed the imaging results and provided a copy to the patient. We will plan to repeat a CT scan in 4 months, and if stable, increase the interval. He is in agreement and will contact us sooner if he has questions or concerns. 2.  Stage IA, NSCLC, large cell carcinoma of the right upper lobe. The patient continues to be radiographically NED. We will continue to follow this expectantly. 3. Gout of the RUE. The patient will follow up with his rheumatologist, and we will follow this expectantly.   In a visit lasting 30 minutes, greater than 50% of the time was spent face to face discussing the results and his history, and coordinating the patient's care.       Carola Rhine, PAC

## 2016-10-23 NOTE — Progress Notes (Signed)
  FUN  Squamous cell carcinoma of left upper lung seeing 4 months after repeat CT chest with contrast.  Radiation SBRT Left upper  Lobe Lung  with Dr. Pablo Ledger  : 9/1/215; 01/14/2014; 01/19/2014, 54 Gy in 3 fractions   See attached CT chest with contrast done at [facility] 10-22-16.  Hx stroke  2012, Asbestos exposure  Widowed, 1 son,  Wt Readings from Last 3 Encounters:  10/24/16 133 lb 3.2 oz (60.4 kg)  09/07/16 132 lb (59.9 kg)  08/25/16 129 lb 1.6 oz (58.6 kg)  BP (!) 122/57   Pulse 85   Temp 98 F (36.7 C) (Oral)   Resp 18   Ht 5\' 8"  (1.727 m)   Wt 133 lb 3.2 oz (60.4 kg)   SpO2 100%   BMI 20.25 kg/m

## 2016-10-24 ENCOUNTER — Ambulatory Visit
Admission: RE | Admit: 2016-10-24 | Discharge: 2016-10-24 | Disposition: A | Discharge status: Home / Self Care | Payer: Medicare Other | Source: Ambulatory Visit | Attending: Radiation Oncology | Admitting: Radiation Oncology

## 2016-10-24 ENCOUNTER — Encounter: Payer: Self-pay | Admitting: Radiation Oncology

## 2016-10-24 ENCOUNTER — Institutional Professional Consult (permissible substitution): Payer: Self-pay | Admitting: Radiation Oncology

## 2016-10-24 VITALS — BP 122/57 | HR 85 | Temp 98.0°F | Resp 18 | Ht 68.0 in | Wt 133.2 lb

## 2016-10-24 DIAGNOSIS — Z8673 Personal history of transient ischemic attack (TIA), and cerebral infarction without residual deficits: Secondary | ICD-10-CM | POA: Diagnosis not present

## 2016-10-24 DIAGNOSIS — Z85118 Personal history of other malignant neoplasm of bronchus and lung: Secondary | ICD-10-CM | POA: Diagnosis not present

## 2016-10-24 DIAGNOSIS — C3490 Malignant neoplasm of unspecified part of unspecified bronchus or lung: Secondary | ICD-10-CM

## 2016-10-24 DIAGNOSIS — C3491 Malignant neoplasm of unspecified part of right bronchus or lung: Secondary | ICD-10-CM | POA: Diagnosis not present

## 2016-10-24 DIAGNOSIS — M109 Gout, unspecified: Secondary | ICD-10-CM | POA: Diagnosis not present

## 2016-10-24 DIAGNOSIS — I25810 Atherosclerosis of coronary artery bypass graft(s) without angina pectoris: Secondary | ICD-10-CM | POA: Diagnosis not present

## 2016-10-24 DIAGNOSIS — Z923 Personal history of irradiation: Secondary | ICD-10-CM | POA: Diagnosis not present

## 2016-10-24 DIAGNOSIS — R970 Elevated carcinoembryonic antigen [CEA]: Secondary | ICD-10-CM | POA: Diagnosis not present

## 2016-10-24 DIAGNOSIS — N189 Chronic kidney disease, unspecified: Secondary | ICD-10-CM | POA: Diagnosis not present

## 2016-10-24 DIAGNOSIS — C3412 Malignant neoplasm of upper lobe, left bronchus or lung: Secondary | ICD-10-CM | POA: Diagnosis not present

## 2016-10-24 DIAGNOSIS — D631 Anemia in chronic kidney disease: Secondary | ICD-10-CM | POA: Diagnosis not present

## 2016-10-24 DIAGNOSIS — M810 Age-related osteoporosis without current pathological fracture: Secondary | ICD-10-CM | POA: Diagnosis not present

## 2016-10-24 DIAGNOSIS — Z77090 Contact with and (suspected) exposure to asbestos: Secondary | ICD-10-CM | POA: Diagnosis not present

## 2016-10-24 DIAGNOSIS — I509 Heart failure, unspecified: Secondary | ICD-10-CM | POA: Diagnosis not present

## 2016-10-24 DIAGNOSIS — E538 Deficiency of other specified B group vitamins: Secondary | ICD-10-CM | POA: Diagnosis not present

## 2016-10-24 DIAGNOSIS — E860 Dehydration: Secondary | ICD-10-CM | POA: Diagnosis not present

## 2016-10-24 DIAGNOSIS — D649 Anemia, unspecified: Secondary | ICD-10-CM | POA: Diagnosis not present

## 2016-10-24 NOTE — Addendum Note (Signed)
Encounter addended by: Malena Edman, RN on: 10/24/2016  3:24 PM<BR>    Actions taken: Charge Capture section accepted

## 2016-10-25 ENCOUNTER — Telehealth: Payer: Self-pay | Admitting: Cardiovascular Disease

## 2016-10-25 DIAGNOSIS — I25810 Atherosclerosis of coronary artery bypass graft(s) without angina pectoris: Secondary | ICD-10-CM | POA: Diagnosis not present

## 2016-10-25 DIAGNOSIS — M109 Gout, unspecified: Secondary | ICD-10-CM | POA: Diagnosis not present

## 2016-10-25 NOTE — Telephone Encounter (Signed)
Received records from [facility] for appointment on 10/26/16 with Dr Gwenlyn Found.  Records put with Dr Kennon Holter schedule for 10/26/16. lp

## 2016-10-26 ENCOUNTER — Telehealth: Payer: Self-pay | Admitting: *Deleted

## 2016-10-26 ENCOUNTER — Encounter: Payer: Self-pay | Admitting: Cardiovascular Disease

## 2016-10-26 ENCOUNTER — Ambulatory Visit (INDEPENDENT_AMBULATORY_CARE_PROVIDER_SITE_OTHER): Payer: Medicare Other | Admitting: Cardiovascular Disease

## 2016-10-26 VITALS — BP 172/70 | HR 77 | Ht 68.0 in | Wt 131.0 lb

## 2016-10-26 DIAGNOSIS — R079 Chest pain, unspecified: Secondary | ICD-10-CM | POA: Diagnosis not present

## 2016-10-26 DIAGNOSIS — I200 Unstable angina: Secondary | ICD-10-CM | POA: Diagnosis not present

## 2016-10-26 DIAGNOSIS — Z951 Presence of aortocoronary bypass graft: Secondary | ICD-10-CM

## 2016-10-26 NOTE — Progress Notes (Signed)
10/26/2016 [place]   1929-01-03  [id number]  Primary Physician Townsend Roger, MD Primary Cardiologist: Lorretta Harp MD Renae Gloss  HPI:    Glenn Reilly is an 83 year old thin appearing widowed Caucasian male father of one son who accompanies him today. I last saw him in the office 09/07/16.Marland Kitchen He was referred by Dr. Vassie Loll Eyk His cardiac risk factor profile is remarkable for remote tobacco abuse, hyperlipidemia and hypertension. He has never had a heart attack but did have a stroke in 2011 at the time of left carotid endarterectomy. He said coronary artery disease with stenting in 1999 performed by Dr. Eustace Quail and again 2004. His left calf because k patent stent with moderate LAD and circumflex disease. He does have diastolic dysfunction. He has had lung cancer and right lung resection by Dr. Baldemar Friday in 2001. On Thursday night after mowing his lawn on a riding lower developed back pain rating to his chest associated with dyspnea on exertion. This resolved throughout the night has not recurred. He did have apparently inferior T-wave inversion on EKG today which was new. A Myoview stress test was normal as was a 2-D echo echocardiogram. Lower extremity arterial Doppler studies showed ankle-brachial indices of greater than one bilaterally with a suggestion of mild to moderate iliac disease. He developed exertional left upper extremity discomfort with dyspnea over the last month which is fairly reproducible and similar to his prior symptoms before coronary intervention. Based on this, I performed outpatient cardiac catheterization on 11/05/13 in the right radial approach. I demonstrated 50% left main, high-grade proximal LAD, ostial circumflex and moderate mid RCA disease with normal LV function. Based on this, I suggested coronary artery bypass grafting is the best revascularization strategy especially in light of his pulmonary nodule which could potentially be biopsied  at the time of surgery. This was performed by Dr. Roxy Manns on 11/20/13 with a LIMA to his LAD and a vein to the circumflex coronary artery. He also had a Tru-Cut needle biopsy of his left upper lobe nodule which turned out to be some squamous cell carcinoma. He has received radiation therapy for this. He did participate in cardiac rehabilitation. A chest CT showed diminished size of his nodule. He was hospitalized for pneumonia and was told he had "congestive heart failure. A BNP was mildly elevated. He did have lower extremity edema and his Lasix was increased. A subsequent 2-D echo performed in March of last year showed preserved LV function with diastolic dysfunction. He saw Almyra Deforest [facility] in the office recently complaining of chest pain and a Myoview stress test was performed which showed normal LV systolic function with normal perfusion. His chest pain has subsequently resolved. He was hospitalized at [facility] last month with pneumonia and flu. He had some increased lotion edema which responded to adjusting his diuretic dose. Since I saw him in the office a year ago he did develop left upper extremity discomfort similar to his previous coronary symptoms and saw Almyra Deforest in the office on 08/15/16 range for him to undergo outpatient cardiac cath by Dr. Burt Knack 08/20/16. He was found to have an 80% "in-stent restenosis" within the previously placed RCA stent which was restented. He was discharged on the same day. His symptoms resolved after that however several weeks later he had recurrent symptoms similar to his pre-stent symptoms with chest pain, shortness of breath and left arm pain. These have become progressively worse.   Current  Outpatient Prescriptions  Medication Sig Dispense Refill  . albuterol (PROVENTIL HFA;VENTOLIN HFA) 108 (90 BASE) MCG/ACT inhaler Inhale 2 puffs into the lungs every 6 (six) hours as needed for wheezing or shortness of breath.    . allopurinol (ZYLOPRIM) 100 MG tablet Take 100 mg  by mouth 2 (two) times daily.    Marland Kitchen amLODipine (NORVASC) 5 MG tablet Take 1 tablet (5 mg total) by mouth daily. 30 tablet 3  . aspirin 81 MG tablet Take 1 tablet (81 mg total) by mouth at bedtime. 30 tablet 0  . clopidogrel (PLAVIX) 75 MG tablet Take 1 tablet (75 mg total) by mouth daily. 30 tablet 0  . Coenzyme Q10 (CO Q 10 PO) Take 1 tablet by mouth daily.    . colchicine 0.6 MG tablet Take 0.6 mg by mouth daily. Taking one every other day    . furosemide (LASIX) 40 MG tablet Take 1 tablet (40 mg total) by mouth daily. (Patient taking differently: Take 20 mg by mouth daily. ) 30 tablet   . ipratropium (ATROVENT) 0.03 % nasal spray Place 2 sprays into both nostrils 2 (two) times daily as needed for allergies.  5  . ipratropium-albuterol (DUONEB) 0.5-2.5 (3) MG/3ML SOLN Take 3 mLs by nebulization 4 (four) times daily.     Marland Kitchen levothyroxine (SYNTHROID, LEVOTHROID) 75 MCG tablet Take 75 mcg by mouth daily before breakfast.    . lisinopril (PRINIVIL,ZESTRIL) 20 MG tablet Take 1 tablet (20 mg total) by mouth daily. 30 tablet 3  . mometasone-formoterol (DULERA) 200-5 MCG/ACT AERO Inhale 2 puffs into the lungs 2 (two) times daily.    . montelukast (SINGULAIR) 10 MG tablet Take 10 mg by mouth at bedtime.    . pantoprazole (PROTONIX) 40 MG tablet Take 1 tablet (40 mg total) by mouth 2 (two) times daily. 60 tablet 1  . predniSONE (DELTASONE) 10 MG tablet Take 10 mg by mouth daily with breakfast. Taking steroid taper 40 mg for one week 30 mg x one week, 20 mg for a week then return to 10 mg daily     No current facility-administered medications for this visit.     Allergies  Allergen Reactions  . Demerol [Meperidine] Anaphylaxis  . Statins Other (See Comments)    Myalgia     Social History   Social History  . Marital status: Widowed    Spouse name: N/A  . Number of children: 1  . Years of education: N/A   Occupational History  . 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 Smoking status: Former Smoker    Packs/day: 1.00    Years: 30.00    Types: Cigarettes    Quit date: 03/03/1979  . Smokeless tobacco: Current User    Types: Chew  . Alcohol use No  . Drug use: No  . Sexual activity: Not on file   Other Topics Concern  . Not on file   Social History Narrative  . No narrative on file     Review of Systems: General: negative for chills, fever, night sweats or weight changes.  Cardiovascular: negative for chest pain, dyspnea on exertion, edema, orthopnea, palpitations, paroxysmal nocturnal dyspnea or shortness of breath Dermatological: negative for rash Respiratory: negative for cough or wheezing Urologic: negative for hematuria Abdominal: negative for nausea, vomiting, diarrhea, bright red blood per rectum, melena, or hematemesis Neurologic: negative for visual changes, syncope, or dizziness All other systems reviewed and are otherwise  negative except as noted above.    Blood pressure (!) 172/70, pulse 77, height 5\' 8"  (1.727 m), weight 131 lb (59.4 kg).  General appearance: alert and no distress Neck: no adenopathy, no carotid bruit, no JVD, supple, symmetrical, trachea midline and thyroid not enlarged, symmetric, no tenderness/mass/nodules Lungs: clear to auscultation bilaterally Heart: regular rate and rhythm, S1, S2 normal, no murmur, click, rub or gallop Extremities: extremities normal, atraumatic, no cyanosis or edema  EKG sinus rhythm at 77 with nonspecific ST-T wave changes. There were T-wave inversions in the inferolateral leads. I personally reviewed this EKG.  ASSESSMENT AND PLAN:   S/P Off-pump CABG x 2 Glenn Reilly had coronary artery bypass grafting by Dr. Roxy Manns 11/20/13 with a LIMA to his LAD and a vein to the circumflex coronary artery. Because of recurrent symptoms he underwent cardiac catheterization by Dr. Burt Knack 08/20/16 revealing patent grafts with 80% "in-stent restenosis within his previously placed  proximal RCA stent. This was restented with a 2.75 mm x 20 mV energy drug-eluting stent with an excellent result. When I saw him back in the office 09/07/16 he was feeling clinically improved. Over the last month however he's noticed increasing chest pain, shortness of breath and left arm pain, similar to his symptoms prior to his stent procedure on 09/07/16. Based on this, I'm going to repeat his cardiac cardiac catheterization on Monday. I am going to start him on metoprolol 12 mg by mouth twice a day. Apparently he is intolerant to oral nitrates.      Lorretta Harp MD FACP,FACC,FAHA, [facility] 10/26/2016 5:08 PM

## 2016-10-26 NOTE — Telephone Encounter (Signed)
XXXX 

## 2016-10-26 NOTE — Assessment & Plan Note (Addendum)
Glenn Reilly had coronary artery bypass grafting by Dr. Roxy Manns 11/20/13 with a LIMA to his LAD and a vein to the circumflex coronary artery. Because of recurrent symptoms he underwent cardiac catheterization by Dr. Burt Knack 08/20/16 revealing patent grafts with 80% "in-stent restenosis within his previously placed proximal RCA stent. This was restented with a 2.75 mm x 20 mV energy drug-eluting stent with an excellent result. When I saw him back in the office 09/07/16 he was feeling clinically improved. Over the last month however he's noticed increasing chest pain, shortness of breath and left arm pain, similar to his symptoms prior to his stent procedure on 09/07/16. Based on this, I'm going to repeat his cardiac cardiac catheterization on Monday. I am going to start him on metoprolol 12 mg by mouth twice a day. Apparently he is intolerant to oral nitrates.

## 2016-10-26 NOTE — Telephone Encounter (Signed)
CALLED PATIENT TO INFORM OF STAT LABS ON 02/27/17 @ 2:30 PM @ [place] @ 3:30 PM @ [place] - 2 HRS. PRIOR TO TEST, SPOKE WITH PATIENT AND HE IS AWARE OF THESE APPTS

## 2016-10-26 NOTE — Patient Instructions (Signed)
   [place] [address] [place] [address] Dept: [phone] Loc: [place]  10/26/2016  You are scheduled for a Cardiac Catheterization on Monday, June 18 with Dr. Quay Burow.  1. Please arrive at the [facility] (Main Entrance A) at [facility]: [place], [place] [address] at 1:30 PM (two hours before your procedure to ensure your preparation). Free valet parking service is available.   Special note: Every effort is made to have your procedure done on time. Please understand that emergencies sometimes delay scheduled procedures.  2. Diet: Do not eat or drink anything after midnight prior to your procedure except sips of water to take medications.  3. Labs: You will need to have blood drawn on Saturday, June 16 at [place], [place] [address]  Open from 8am-12pm.  4. Medication instructions in preparation for your procedure:  On the morning of your procedure, take your Plavix/Clopidogrel and any morning medicines NOT listed above.  You may use sips of water.  5. Plan for one night stay--bring personal belongings. 6. Bring a current list of your medications and current insurance cards. 7. You MUST have a responsible person to drive you home. 8. Someone MUST be with you the first 24 hours after you arrive home or your discharge will be delayed. 9. Please wear clothes that are easy to get on and off and wear slip-on shoes.  Thank you for allowing [place] to care for you!   -- [facility]

## 2016-10-27 DIAGNOSIS — R079 Chest pain, unspecified: Secondary | ICD-10-CM | POA: Diagnosis not present

## 2016-10-28 LAB — CBC WITH DIFFERENTIAL/PLATELET
BASOS ABS: 0 10*3/uL (ref 0.0–0.2)
Basos: 0 %
EOS (ABSOLUTE): 0 10*3/uL (ref 0.0–0.4)
Eos: 0 %
HEMOGLOBIN: 8.9 g/dL — AB (ref 13.0–17.7)
Hematocrit: 29.6 % — ABNORMAL LOW (ref 37.5–51.0)
Immature Grans (Abs): 0.1 10*3/uL (ref 0.0–0.1)
Immature Granulocytes: 1 %
LYMPHS ABS: 0.5 10*3/uL — AB (ref 0.7–3.1)
Lymphs: 4 %
MCH: 30.5 pg (ref 26.6–33.0)
MCHC: 30.1 g/dL — ABNORMAL LOW (ref 31.5–35.7)
MCV: 101 fL — AB (ref 79–97)
MONOCYTES: 2 %
Monocytes Absolute: 0.3 10*3/uL (ref 0.1–0.9)
NEUTROS ABS: 12.4 10*3/uL — AB (ref 1.4–7.0)
Neutrophils: 93 %
Platelets: 212 10*3/uL (ref 150–379)
RBC: 2.92 x10E6/uL — ABNORMAL LOW (ref 4.14–5.80)
RDW: 20.7 % — ABNORMAL HIGH (ref 12.3–15.4)
WBC: 13.3 10*3/uL — ABNORMAL HIGH (ref 3.4–10.8)

## 2016-10-28 LAB — BASIC METABOLIC PANEL
BUN / CREAT RATIO: 25 — AB (ref 10–24)
BUN: 40 mg/dL — AB (ref 8–27)
CHLORIDE: 105 mmol/L (ref 96–106)
CO2: 22 mmol/L (ref 20–29)
Calcium: 9.5 mg/dL (ref 8.6–10.2)
Creatinine, Ser: 1.63 mg/dL — ABNORMAL HIGH (ref 0.76–1.27)
GFR calc non Af Amer: 38 mL/min/{1.73_m2} — ABNORMAL LOW (ref >59)
GFR, EST AFRICAN AMERICAN: 44 mL/min/{1.73_m2} — AB (ref >59)
Glucose: 167 mg/dL — ABNORMAL HIGH (ref 65–99)
POTASSIUM: 4.2 mmol/L (ref 3.5–5.2)
Sodium: 143 mmol/L (ref 134–144)

## 2016-10-28 LAB — APTT: aPTT: 23 s — ABNORMAL LOW (ref 24–33)

## 2016-10-28 LAB — TSH: TSH: 0.404 u[IU]/mL — ABNORMAL LOW (ref 0.450–4.500)

## 2016-10-28 LAB — PROTIME-INR
INR: 1.1 (ref 0.8–1.2)
Prothrombin Time: 11.9 s (ref 9.1–12.0)

## 2016-10-29 ENCOUNTER — Ambulatory Visit (HOSPITAL_COMMUNITY)
Admission: RE | Admit: 2016-10-29 | Discharge: 2016-10-29 | Disposition: A | Discharge status: Home / Self Care | Payer: Medicare Other | Source: Ambulatory Visit | Attending: Cardiovascular Disease | Admitting: Cardiovascular Disease

## 2016-10-29 ENCOUNTER — Encounter (HOSPITAL_COMMUNITY)
Admission: RE | Disposition: A | Discharge status: Home / Self Care | Payer: Self-pay | Source: Ambulatory Visit | Attending: Cardiovascular Disease

## 2016-10-29 DIAGNOSIS — Z8673 Personal history of transient ischemic attack (TIA), and cerebral infarction without residual deficits: Secondary | ICD-10-CM | POA: Diagnosis not present

## 2016-10-29 DIAGNOSIS — Z951 Presence of aortocoronary bypass graft: Secondary | ICD-10-CM | POA: Insufficient documentation

## 2016-10-29 DIAGNOSIS — I110 Hypertensive heart disease with heart failure: Secondary | ICD-10-CM | POA: Diagnosis not present

## 2016-10-29 DIAGNOSIS — Z79899 Other long term (current) drug therapy: Secondary | ICD-10-CM | POA: Diagnosis not present

## 2016-10-29 DIAGNOSIS — I509 Heart failure, unspecified: Secondary | ICD-10-CM | POA: Insufficient documentation

## 2016-10-29 DIAGNOSIS — I2510 Atherosclerotic heart disease of native coronary artery without angina pectoris: Secondary | ICD-10-CM | POA: Insufficient documentation

## 2016-10-29 DIAGNOSIS — Z7982 Long term (current) use of aspirin: Secondary | ICD-10-CM | POA: Diagnosis not present

## 2016-10-29 DIAGNOSIS — E785 Hyperlipidemia, unspecified: Secondary | ICD-10-CM | POA: Diagnosis not present

## 2016-10-29 DIAGNOSIS — Z885 Allergy status to narcotic agent status: Secondary | ICD-10-CM | POA: Insufficient documentation

## 2016-10-29 DIAGNOSIS — Z720 Tobacco use: Secondary | ICD-10-CM | POA: Insufficient documentation

## 2016-10-29 DIAGNOSIS — Z7902 Long term (current) use of antithrombotics/antiplatelets: Secondary | ICD-10-CM | POA: Insufficient documentation

## 2016-10-29 DIAGNOSIS — Z888 Allergy status to other drugs, medicaments and biological substances status: Secondary | ICD-10-CM | POA: Insufficient documentation

## 2016-10-29 DIAGNOSIS — R079 Chest pain, unspecified: Secondary | ICD-10-CM

## 2016-10-29 DIAGNOSIS — I2583 Coronary atherosclerosis due to lipid rich plaque: Secondary | ICD-10-CM | POA: Diagnosis not present

## 2016-10-29 DIAGNOSIS — I25118 Atherosclerotic heart disease of native coronary artery with other forms of angina pectoris: Secondary | ICD-10-CM | POA: Diagnosis present

## 2016-10-29 DIAGNOSIS — Z85118 Personal history of other malignant neoplasm of bronchus and lung: Secondary | ICD-10-CM | POA: Insufficient documentation

## 2016-10-29 HISTORY — PX: LEFT HEART CATH AND CORONARY ANGIOGRAPHY: CATH118249

## 2016-10-29 SURGERY — LEFT HEART CATH AND CORONARY ANGIOGRAPHY
Anesthesia: LOCAL

## 2016-10-29 MED ORDER — ONDANSETRON HCL 4 MG/2ML IJ SOLN: 4.0000 mg | Freq: Four times a day (QID) | INTRAMUSCULAR | Status: DC | PRN

## 2016-10-29 MED ORDER — LIDOCAINE HCL (PF) 1 % IJ SOLN
INTRAMUSCULAR | Status: AC
  Filled 2016-10-29: qty 30

## 2016-10-29 MED ORDER — MORPHINE SULFATE (PF) 4 MG/ML IV SOLN: 2.0000 mg | INTRAVENOUS | Status: DC | PRN

## 2016-10-29 MED ORDER — CLOPIDOGREL BISULFATE 75 MG PO TABS: 75.0000 mg | ORAL_TABLET | Freq: Every day | ORAL | Status: DC

## 2016-10-29 MED ORDER — IOPAMIDOL (ISOVUE-370) INJECTION 76%
INTRAVENOUS | Status: AC
  Filled 2016-10-29: qty 100

## 2016-10-29 MED ORDER — HEPARIN (PORCINE) IN NACL 2-0.9 UNIT/ML-% IJ SOLN
INTRAMUSCULAR | Status: AC
  Filled 2016-10-29: qty 1000

## 2016-10-29 MED ORDER — LIDOCAINE HCL (PF) 1 % IJ SOLN
INTRAMUSCULAR | Status: DC | PRN
  Administered 2016-10-29: 20 mL

## 2016-10-29 MED ORDER — SODIUM CHLORIDE 0.9% FLUSH: 3.0000 mL | INTRAVENOUS | Status: DC | PRN

## 2016-10-29 MED ORDER — ASPIRIN 81 MG PO CHEW: 81.0000 mg | CHEWABLE_TABLET | ORAL | Status: DC

## 2016-10-29 MED ORDER — MIDAZOLAM HCL 2 MG/2ML IJ SOLN
INTRAMUSCULAR | Status: AC
  Filled 2016-10-29: qty 2

## 2016-10-29 MED ORDER — SODIUM CHLORIDE 0.9 % IV SOLN: INTRAVENOUS | Status: AC

## 2016-10-29 MED ORDER — IOPAMIDOL (ISOVUE-370) INJECTION 76%
INTRAVENOUS | Status: DC | PRN
Start: 2016-10-29 — End: 2016-10-29
  Administered 2016-10-29: 85 mL via INTRA_ARTERIAL

## 2016-10-29 MED ORDER — SODIUM CHLORIDE 0.9 % WEIGHT BASED INFUSION: 1.0000 mL/kg/h | INTRAVENOUS | Status: DC

## 2016-10-29 MED ORDER — MIDAZOLAM HCL 2 MG/2ML IJ SOLN
INTRAMUSCULAR | Status: DC | PRN
  Administered 2016-10-29: 1 mg via INTRAVENOUS

## 2016-10-29 MED ORDER — IOPAMIDOL (ISOVUE-370) INJECTION 76%
INTRAVENOUS | Status: AC
  Filled 2016-10-29: qty 50

## 2016-10-29 MED ORDER — ASPIRIN 81 MG PO CHEW: 81.0000 mg | CHEWABLE_TABLET | Freq: Every day | ORAL | Status: DC

## 2016-10-29 MED ORDER — SODIUM CHLORIDE 0.9 % WEIGHT BASED INFUSION
3.0000 mL/kg/h | INTRAVENOUS | Status: DC
  Administered 2016-10-29: 3 mL/kg/h via INTRAVENOUS

## 2016-10-29 MED ORDER — HEPARIN (PORCINE) IN NACL 2-0.9 UNIT/ML-% IJ SOLN
INTRAMUSCULAR | Status: AC | PRN
  Administered 2016-10-29: 1000 mL

## 2016-10-29 MED ORDER — SODIUM CHLORIDE 0.9 % IV SOLN: 250.0000 mL | INTRAVENOUS | Status: DC | PRN

## 2016-10-29 MED ORDER — SODIUM CHLORIDE 0.9% FLUSH: 3.0000 mL | Freq: Two times a day (BID) | INTRAVENOUS | Status: DC

## 2016-10-29 MED ORDER — HYDRALAZINE HCL 20 MG/ML IJ SOLN
10.0000 mg | INTRAMUSCULAR | Status: DC | PRN
Start: 2016-10-29 — End: 2016-10-29

## 2016-10-29 MED ORDER — ACETAMINOPHEN 325 MG PO TABS: 650.0000 mg | ORAL_TABLET | ORAL | Status: DC | PRN

## 2016-10-29 MED ORDER — FENTANYL CITRATE (PF) 100 MCG/2ML IJ SOLN
INTRAMUSCULAR | Status: AC
  Filled 2016-10-29: qty 2

## 2016-10-29 MED ORDER — FENTANYL CITRATE (PF) 100 MCG/2ML IJ SOLN
INTRAMUSCULAR | Status: DC | PRN
  Administered 2016-10-29: 25 ug via INTRAVENOUS

## 2016-10-29 SURGICAL SUPPLY — 9 items
CATH INFINITI 5FR AL1 (CATHETERS) ×2 IMPLANT
CATH INFINITI 5FR MULTPACK ANG (CATHETERS) ×2 IMPLANT
DEVICE CLOSURE MYNXGRIP 5F (Vascular Products) ×2 IMPLANT
KIT HEART LEFT (KITS) ×2 IMPLANT
PACK CARDIAC CATHETERIZATION (CUSTOM PROCEDURE TRAY) ×2 IMPLANT
SHEATH PINNACLE 5F 10CM (SHEATH) ×2 IMPLANT
TRANSDUCER W/STOPCOCK (MISCELLANEOUS) ×2 IMPLANT
TUBING CIL FLEX 10 FLL-RA (TUBING) ×2 IMPLANT
WIRE EMERALD 3MM-J .035X150CM (WIRE) ×2 IMPLANT

## 2016-10-29 NOTE — Interval H&P Note (Signed)
Cath Lab Visit (complete for each Cath Lab visit)  Clinical Evaluation Leading to the Procedure:   ACS: No.  Non-ACS:    Anginal Classification: CCS III  Anti-ischemic medical therapy: Maximal Therapy (2 or more classes of medications)  Non-Invasive Test Results: No non-invasive testing performed  Prior CABG: Previous CABG      History and Physical Interval Note:  10/29/2016 3:32 PM  Glenn Reilly  has presented today for surgery, with the diagnosis of chest pain  The various methods of treatment have been discussed with the patient and family. After consideration of risks, benefits and other options for treatment, the patient has consented to  Procedure(s): Left Heart Cath and Coronary Angiography (N/A) as a surgical intervention .  The patient's history has been reviewed, patient examined, no change in status, stable for surgery.  I have reviewed the patient's chart and labs.  Questions were answered to the patient's satisfaction.     Quay Burow

## 2016-10-29 NOTE — Discharge Instructions (Signed)

## 2016-10-29 NOTE — H&P (View-Only) (Signed)
10/26/2016 [place]   Aug 28, 1928  [id number]  Primary Physician Townsend Roger, MD Primary Cardiologist: Lorretta Harp MD Renae Gloss  HPI:    Glenn Reilly is an 83 year old thin appearing widowed Caucasian male father of one son who accompanies him today. I last saw him in the office 09/07/16.Marland Kitchen He was referred by Dr. Vassie Loll Eyk His cardiac risk factor profile is remarkable for remote tobacco abuse, hyperlipidemia and hypertension. He has never had a heart attack but did have a stroke in 2011 at the time of left carotid endarterectomy. He said coronary artery disease with stenting in 1999 performed by Dr. Eustace Quail and again 2004. His left calf because k patent stent with moderate LAD and circumflex disease. He does have diastolic dysfunction. He has had lung cancer and right lung resection by Dr. Baldemar Friday in 2001. On Thursday night after mowing his lawn on a riding lower developed back pain rating to his chest associated with dyspnea on exertion. This resolved throughout the night has not recurred. He did have apparently inferior T-wave inversion on EKG today which was new. A Myoview stress test was normal as was a 2-D echo echocardiogram. Lower extremity arterial Doppler studies showed ankle-brachial indices of greater than one bilaterally with a suggestion of mild to moderate iliac disease. He developed exertional left upper extremity discomfort with dyspnea over the last month which is fairly reproducible and similar to his prior symptoms before coronary intervention. Based on this, I performed outpatient cardiac catheterization on 11/05/13 in the right radial approach. I demonstrated 50% left main, high-grade proximal LAD, ostial circumflex and moderate mid RCA disease with normal LV function. Based on this, I suggested coronary artery bypass grafting is the best revascularization strategy especially in light of his pulmonary nodule which could potentially be biopsied  at the time of surgery. This was performed by Dr. Roxy Manns on 11/20/13 with a LIMA to his LAD and a vein to the circumflex coronary artery. He also had a Tru-Cut needle biopsy of his left upper lobe nodule which turned out to be some squamous cell carcinoma. He has received radiation therapy for this. He did participate in cardiac rehabilitation. A chest CT showed diminished size of his nodule. He was hospitalized for pneumonia and was told he had "congestive heart failure. A BNP was mildly elevated. He did have lower extremity edema and his Lasix was increased. A subsequent 2-D echo performed in March of last year showed preserved LV function with diastolic dysfunction. He saw Almyra Deforest [facility] in the office recently complaining of chest pain and a Myoview stress test was performed which showed normal LV systolic function with normal perfusion. His chest pain has subsequently resolved. He was hospitalized at [facility] last month with pneumonia and flu. He had some increased lotion edema which responded to adjusting his diuretic dose. Since I saw him in the office a year ago he did develop left upper extremity discomfort similar to his previous coronary symptoms and saw Almyra Deforest in the office on 08/15/16 range for him to undergo outpatient cardiac cath by Dr. Burt Knack 08/20/16. He was found to have an 80% "in-stent restenosis" within the previously placed RCA stent which was restented. He was discharged on the same day. His symptoms resolved after that however several weeks later he had recurrent symptoms similar to his pre-stent symptoms with chest pain, shortness of breath and left arm pain. These have become progressively worse.   Current  Outpatient Prescriptions  Medication Sig Dispense Refill  . albuterol (PROVENTIL HFA;VENTOLIN HFA) 108 (90 BASE) MCG/ACT inhaler Inhale 2 puffs into the lungs every 6 (six) hours as needed for wheezing or shortness of breath.    . allopurinol (ZYLOPRIM) 100 MG tablet Take 100 mg  by mouth 2 (two) times daily.    Marland Kitchen amLODipine (NORVASC) 5 MG tablet Take 1 tablet (5 mg total) by mouth daily. 30 tablet 3  . aspirin 81 MG tablet Take 1 tablet (81 mg total) by mouth at bedtime. 30 tablet 0  . clopidogrel (PLAVIX) 75 MG tablet Take 1 tablet (75 mg total) by mouth daily. 30 tablet 0  . Coenzyme Q10 (CO Q 10 PO) Take 1 tablet by mouth daily.    . colchicine 0.6 MG tablet Take 0.6 mg by mouth daily. Taking one every other day    . furosemide (LASIX) 40 MG tablet Take 1 tablet (40 mg total) by mouth daily. (Patient taking differently: Take 20 mg by mouth daily. ) 30 tablet   . ipratropium (ATROVENT) 0.03 % nasal spray Place 2 sprays into both nostrils 2 (two) times daily as needed for allergies.  5  . ipratropium-albuterol (DUONEB) 0.5-2.5 (3) MG/3ML SOLN Take 3 mLs by nebulization 4 (four) times daily.     Marland Kitchen levothyroxine (SYNTHROID, LEVOTHROID) 75 MCG tablet Take 75 mcg by mouth daily before breakfast.    . lisinopril (PRINIVIL,ZESTRIL) 20 MG tablet Take 1 tablet (20 mg total) by mouth daily. 30 tablet 3  . mometasone-formoterol (DULERA) 200-5 MCG/ACT AERO Inhale 2 puffs into the lungs 2 (two) times daily.    . montelukast (SINGULAIR) 10 MG tablet Take 10 mg by mouth at bedtime.    . pantoprazole (PROTONIX) 40 MG tablet Take 1 tablet (40 mg total) by mouth 2 (two) times daily. 60 tablet 1  . predniSONE (DELTASONE) 10 MG tablet Take 10 mg by mouth daily with breakfast. Taking steroid taper 40 mg for one week 30 mg x one week, 20 mg for a week then return to 10 mg daily     No current facility-administered medications for this visit.     Allergies  Allergen Reactions  . Demerol [Meperidine] Anaphylaxis  . Statins Other (See Comments)    Myalgia     Social History   Social History  . Marital status: Widowed    Spouse name: N/A  . Number of children: 1  . Years of education: N/A   Occupational History  . 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 Smoking status: Former Smoker    Packs/day: 1.00    Years: 30.00    Types: Cigarettes    Quit date: 03/03/1979  . Smokeless tobacco: Current User    Types: Chew  . Alcohol use No  . Drug use: No  . Sexual activity: Not on file   Other Topics Concern  . Not on file   Social History Narrative  . No narrative on file     Review of Systems: General: negative for chills, fever, night sweats or weight changes.  Cardiovascular: negative for chest pain, dyspnea on exertion, edema, orthopnea, palpitations, paroxysmal nocturnal dyspnea or shortness of breath Dermatological: negative for rash Respiratory: negative for cough or wheezing Urologic: negative for hematuria Abdominal: negative for nausea, vomiting, diarrhea, bright red blood per rectum, melena, or hematemesis Neurologic: negative for visual changes, syncope, or dizziness All other systems reviewed and are otherwise  negative except as noted above.    Blood pressure (!) 172/70, pulse 77, height 5\' 8"  (1.727 m), weight 131 lb (59.4 kg).  General appearance: alert and no distress Neck: no adenopathy, no carotid bruit, no JVD, supple, symmetrical, trachea midline and thyroid not enlarged, symmetric, no tenderness/mass/nodules Lungs: clear to auscultation bilaterally Heart: regular rate and rhythm, S1, S2 normal, no murmur, click, rub or gallop Extremities: extremities normal, atraumatic, no cyanosis or edema  EKG sinus rhythm at 77 with nonspecific ST-T wave changes. There were T-wave inversions in the inferolateral leads. I personally reviewed this EKG.  ASSESSMENT AND PLAN:   S/P Off-pump CABG x 2 Glenn Reilly had coronary artery bypass grafting by Dr. Roxy Manns 11/20/13 with a LIMA to his LAD and a vein to the circumflex coronary artery. Because of recurrent symptoms he underwent cardiac catheterization by Dr. Burt Knack 08/20/16 revealing patent grafts with 80% "in-stent restenosis within his previously placed  proximal RCA stent. This was restented with a 2.75 mm x 20 mV energy drug-eluting stent with an excellent result. When I saw him back in the office 09/07/16 he was feeling clinically improved. Over the last month however he's noticed increasing chest pain, shortness of breath and left arm pain, similar to his symptoms prior to his stent procedure on 09/07/16. Based on this, I'm going to repeat his cardiac cardiac catheterization on Monday. I am going to start him on metoprolol 12 mg by mouth twice a day. Apparently he is intolerant to oral nitrates.      Lorretta Harp MD FACP,FACC,FAHA, [facility] 10/26/2016 5:08 PM

## 2016-10-30 ENCOUNTER — Encounter (HOSPITAL_COMMUNITY): Payer: Self-pay | Admitting: Cardiovascular Disease

## 2016-10-30 DIAGNOSIS — M79641 Pain in right hand: Secondary | ICD-10-CM | POA: Diagnosis not present

## 2016-10-30 DIAGNOSIS — R404 Transient alteration of awareness: Secondary | ICD-10-CM | POA: Diagnosis not present

## 2016-10-30 DIAGNOSIS — R531 Weakness: Secondary | ICD-10-CM | POA: Diagnosis not present

## 2016-10-30 DIAGNOSIS — R42 Dizziness and giddiness: Secondary | ICD-10-CM | POA: Diagnosis not present

## 2016-10-30 DIAGNOSIS — M791 Myalgia: Secondary | ICD-10-CM | POA: Diagnosis not present

## 2016-10-31 ENCOUNTER — Telehealth: Payer: Self-pay

## 2016-10-31 ENCOUNTER — Ambulatory Visit: Payer: Medicare Other | Admitting: Radiation Oncology

## 2016-10-31 ENCOUNTER — Ambulatory Visit: Payer: Medicare Other

## 2016-10-31 NOTE — Telephone Encounter (Signed)
Pt s/p cath 10/29/2016.  Made f/u appt with Dr. Gwenlyn Found for 11/13/2016 @ 1000 am.  Called Pt with appt time.  Pt indicates understanding.

## 2016-11-08 ENCOUNTER — Telehealth: Payer: Self-pay | Admitting: Cardiovascular Disease

## 2016-11-08 DIAGNOSIS — E790 Hyperuricemia without signs of inflammatory arthritis and tophaceous disease: Secondary | ICD-10-CM | POA: Diagnosis not present

## 2016-11-08 DIAGNOSIS — M109 Gout, unspecified: Secondary | ICD-10-CM | POA: Diagnosis not present

## 2016-11-08 DIAGNOSIS — M7989 Other specified soft tissue disorders: Secondary | ICD-10-CM | POA: Diagnosis not present

## 2016-11-08 DIAGNOSIS — M79641 Pain in right hand: Secondary | ICD-10-CM | POA: Diagnosis not present

## 2016-11-08 MED ORDER — METOPROLOL TARTRATE 25 MG PO TABS: 12.5000 mg | ORAL_TABLET | Freq: Two times a day (BID) | ORAL | 5 refills | Status: DC

## 2016-11-08 NOTE — Telephone Encounter (Signed)
New Message     New prescription for elevated heart rate was never called in Friday , please call pt     [facility] [address]

## 2016-11-08 NOTE — Telephone Encounter (Signed)
Left msg to return call

## 2016-11-08 NOTE — Telephone Encounter (Signed)
Pt of Dr. Gwenlyn Found  Returned call to discuss request. Daughter states that the pt was instructed to start on new med at his last OV (10/26/16).  Reviewed Dr. Kennon Holter notes - he had been recommended to initiate metoprolol 12.5mg  BID but this wasn't sent in.  I put in rx order, w daughter aware [facility] receive [facility] be [facility]. She's aware I'll inform Dr. Gwenlyn Found. She notes pt doing well post-cath but did have an ER visit the day following cath d/t weakness/low energy. Reports he's since doing well, and all labwork drawn at ER visit was baseline. Reports pt HR still elevated, aware the metoprolol should help w this.  He has post-cath f/u visit w Dr. Gwenlyn Found on 11/13/16.  Routed for further recommendations.

## 2016-11-13 ENCOUNTER — Ambulatory Visit: Payer: Medicare Other | Admitting: Cardiovascular Disease

## 2016-11-13 ENCOUNTER — Encounter: Payer: Self-pay | Admitting: *Deleted

## 2016-11-13 DIAGNOSIS — R0602 Shortness of breath: Secondary | ICD-10-CM | POA: Diagnosis not present

## 2016-11-13 DIAGNOSIS — R531 Weakness: Secondary | ICD-10-CM | POA: Diagnosis not present

## 2016-11-14 ENCOUNTER — Telehealth: Payer: Self-pay | Admitting: Cardiology

## 2016-11-14 NOTE — Telephone Encounter (Signed)
Pt's daughter in law called pt has blurred vision can hardly see color of shirt, they think it is metoprolol but I explained I cannot say that for sure, could be a stroke - told them they should go to ER.

## 2016-11-15 DIAGNOSIS — T8131XA Disruption of external operation (surgical) wound, not elsewhere classified, initial encounter: Secondary | ICD-10-CM | POA: Diagnosis not present

## 2016-11-15 DIAGNOSIS — T8522XA Displacement of intraocular lens, initial encounter: Secondary | ICD-10-CM | POA: Diagnosis not present

## 2016-11-15 DIAGNOSIS — H4020X3 Unspecified primary angle-closure glaucoma, severe stage: Secondary | ICD-10-CM | POA: Diagnosis not present

## 2016-11-15 DIAGNOSIS — H578 Other specified disorders of eye and adnexa: Secondary | ICD-10-CM | POA: Diagnosis not present

## 2016-11-15 DIAGNOSIS — Z01818 Encounter for other preprocedural examination: Secondary | ICD-10-CM | POA: Diagnosis not present

## 2016-11-15 DIAGNOSIS — H44419 Flat anterior chamber hypotony of unspecified eye: Secondary | ICD-10-CM | POA: Diagnosis not present

## 2016-11-15 DIAGNOSIS — H31301 Unspecified choroidal hemorrhage, right eye: Secondary | ICD-10-CM | POA: Diagnosis not present

## 2016-11-20 DIAGNOSIS — Z09 Encounter for follow-up examination after completed treatment for conditions other than malignant neoplasm: Secondary | ICD-10-CM | POA: Diagnosis not present

## 2016-11-20 DIAGNOSIS — H3561 Retinal hemorrhage, right eye: Secondary | ICD-10-CM | POA: Diagnosis not present

## 2016-11-20 DIAGNOSIS — H578 Other specified disorders of eye and adnexa: Secondary | ICD-10-CM | POA: Diagnosis not present

## 2016-11-20 DIAGNOSIS — H4311 Vitreous hemorrhage, right eye: Secondary | ICD-10-CM | POA: Diagnosis not present

## 2016-11-20 DIAGNOSIS — T8131XA Disruption of external operation (surgical) wound, not elsewhere classified, initial encounter: Secondary | ICD-10-CM | POA: Diagnosis not present

## 2016-11-20 DIAGNOSIS — H2101 Hyphema, right eye: Secondary | ICD-10-CM | POA: Diagnosis not present

## 2016-11-21 DIAGNOSIS — R531 Weakness: Secondary | ICD-10-CM | POA: Diagnosis not present

## 2016-11-21 DIAGNOSIS — H547 Unspecified visual loss: Secondary | ICD-10-CM | POA: Diagnosis not present

## 2016-12-04 DIAGNOSIS — K922 Gastrointestinal hemorrhage, unspecified: Secondary | ICD-10-CM | POA: Diagnosis not present

## 2016-12-04 DIAGNOSIS — N189 Chronic kidney disease, unspecified: Secondary | ICD-10-CM | POA: Diagnosis not present

## 2016-12-04 DIAGNOSIS — R970 Elevated carcinoembryonic antigen [CEA]: Secondary | ICD-10-CM | POA: Diagnosis not present

## 2016-12-04 DIAGNOSIS — Z8673 Personal history of transient ischemic attack (TIA), and cerebral infarction without residual deficits: Secondary | ICD-10-CM

## 2016-12-04 DIAGNOSIS — J449 Chronic obstructive pulmonary disease, unspecified: Secondary | ICD-10-CM | POA: Diagnosis not present

## 2016-12-04 DIAGNOSIS — C3412 Malignant neoplasm of upper lobe, left bronchus or lung: Secondary | ICD-10-CM | POA: Diagnosis not present

## 2016-12-04 DIAGNOSIS — D509 Iron deficiency anemia, unspecified: Secondary | ICD-10-CM | POA: Diagnosis not present

## 2016-12-04 DIAGNOSIS — I2510 Atherosclerotic heart disease of native coronary artery without angina pectoris: Secondary | ICD-10-CM | POA: Diagnosis not present

## 2016-12-04 DIAGNOSIS — B001 Herpesviral vesicular dermatitis: Secondary | ICD-10-CM | POA: Diagnosis not present

## 2016-12-04 DIAGNOSIS — H547 Unspecified visual loss: Secondary | ICD-10-CM | POA: Diagnosis not present

## 2016-12-04 DIAGNOSIS — Z85118 Personal history of other malignant neoplasm of bronchus and lung: Secondary | ICD-10-CM | POA: Diagnosis not present

## 2016-12-04 DIAGNOSIS — E860 Dehydration: Secondary | ICD-10-CM | POA: Diagnosis not present

## 2016-12-04 DIAGNOSIS — I509 Heart failure, unspecified: Secondary | ICD-10-CM | POA: Diagnosis not present

## 2016-12-04 DIAGNOSIS — Z77090 Contact with and (suspected) exposure to asbestos: Secondary | ICD-10-CM

## 2016-12-04 DIAGNOSIS — M810 Age-related osteoporosis without current pathological fracture: Secondary | ICD-10-CM | POA: Diagnosis not present

## 2016-12-10 DIAGNOSIS — H179 Unspecified corneal scar and opacity: Secondary | ICD-10-CM | POA: Diagnosis not present

## 2016-12-13 ENCOUNTER — Telehealth: Payer: Self-pay | Admitting: Cardiovascular Disease

## 2016-12-13 NOTE — Telephone Encounter (Signed)
Rec'd from [place] forwarded 63 pages to Dr. Quay Burow

## 2016-12-17 DIAGNOSIS — D045 Carcinoma in situ of skin of trunk: Secondary | ICD-10-CM | POA: Diagnosis not present

## 2016-12-17 DIAGNOSIS — L570 Actinic keratosis: Secondary | ICD-10-CM | POA: Diagnosis not present

## 2016-12-17 DIAGNOSIS — D044 Carcinoma in situ of skin of scalp and neck: Secondary | ICD-10-CM | POA: Diagnosis not present

## 2016-12-26 DIAGNOSIS — Z85118 Personal history of other malignant neoplasm of bronchus and lung: Secondary | ICD-10-CM | POA: Diagnosis not present

## 2016-12-26 DIAGNOSIS — I2510 Atherosclerotic heart disease of native coronary artery without angina pectoris: Secondary | ICD-10-CM | POA: Diagnosis not present

## 2016-12-26 DIAGNOSIS — C3412 Malignant neoplasm of upper lobe, left bronchus or lung: Secondary | ICD-10-CM | POA: Diagnosis not present

## 2016-12-26 DIAGNOSIS — E860 Dehydration: Secondary | ICD-10-CM | POA: Diagnosis not present

## 2016-12-26 DIAGNOSIS — D509 Iron deficiency anemia, unspecified: Secondary | ICD-10-CM | POA: Diagnosis not present

## 2016-12-26 DIAGNOSIS — H547 Unspecified visual loss: Secondary | ICD-10-CM | POA: Diagnosis not present

## 2016-12-26 DIAGNOSIS — R970 Elevated carcinoembryonic antigen [CEA]: Secondary | ICD-10-CM | POA: Diagnosis not present

## 2016-12-26 DIAGNOSIS — N189 Chronic kidney disease, unspecified: Secondary | ICD-10-CM | POA: Diagnosis not present

## 2016-12-26 DIAGNOSIS — M810 Age-related osteoporosis without current pathological fracture: Secondary | ICD-10-CM | POA: Diagnosis not present

## 2016-12-26 DIAGNOSIS — I509 Heart failure, unspecified: Secondary | ICD-10-CM | POA: Diagnosis not present

## 2016-12-26 DIAGNOSIS — Z77090 Contact with and (suspected) exposure to asbestos: Secondary | ICD-10-CM | POA: Diagnosis not present

## 2017-01-07 DIAGNOSIS — H179 Unspecified corneal scar and opacity: Secondary | ICD-10-CM | POA: Diagnosis not present

## 2017-01-07 DIAGNOSIS — H1789 Other corneal scars and opacities: Secondary | ICD-10-CM | POA: Diagnosis not present

## 2017-01-07 DIAGNOSIS — H578 Other specified disorders of eye and adnexa: Secondary | ICD-10-CM | POA: Diagnosis not present

## 2017-01-07 DIAGNOSIS — T8522XA Displacement of intraocular lens, initial encounter: Secondary | ICD-10-CM | POA: Diagnosis not present

## 2017-01-09 ENCOUNTER — Other Ambulatory Visit: Payer: Self-pay | Admitting: Cardiovascular Disease

## 2017-01-09 DIAGNOSIS — I779 Disorder of arteries and arterioles, unspecified: Secondary | ICD-10-CM

## 2017-01-09 DIAGNOSIS — I739 Peripheral vascular disease, unspecified: Principal | ICD-10-CM

## 2017-01-15 DIAGNOSIS — T86848 Other complications of corneal transplant: Secondary | ICD-10-CM | POA: Diagnosis not present

## 2017-01-15 DIAGNOSIS — H578 Other specified disorders of eye and adnexa: Secondary | ICD-10-CM | POA: Diagnosis not present

## 2017-01-15 DIAGNOSIS — H31302 Unspecified choroidal hemorrhage, left eye: Secondary | ICD-10-CM | POA: Diagnosis not present

## 2017-01-21 ENCOUNTER — Inpatient Hospital Stay (HOSPITAL_COMMUNITY): Admission: RE | Admit: 2017-01-21 | Payer: Medicare Other | Source: Ambulatory Visit

## 2017-01-22 ENCOUNTER — Ambulatory Visit: Payer: Medicare Other | Admitting: Cardiovascular Disease

## 2017-01-22 ENCOUNTER — Other Ambulatory Visit: Payer: Self-pay | Admitting: Oncology

## 2017-01-22 DIAGNOSIS — C3412 Malignant neoplasm of upper lobe, left bronchus or lung: Secondary | ICD-10-CM

## 2017-01-22 DIAGNOSIS — R1084 Generalized abdominal pain: Secondary | ICD-10-CM

## 2017-01-24 DIAGNOSIS — M810 Age-related osteoporosis without current pathological fracture: Secondary | ICD-10-CM | POA: Diagnosis not present

## 2017-01-24 DIAGNOSIS — I2510 Atherosclerotic heart disease of native coronary artery without angina pectoris: Secondary | ICD-10-CM | POA: Diagnosis not present

## 2017-01-24 DIAGNOSIS — Z77090 Contact with and (suspected) exposure to asbestos: Secondary | ICD-10-CM | POA: Diagnosis not present

## 2017-01-24 DIAGNOSIS — Z947 Corneal transplant status: Secondary | ICD-10-CM | POA: Diagnosis not present

## 2017-01-24 DIAGNOSIS — E860 Dehydration: Secondary | ICD-10-CM | POA: Diagnosis not present

## 2017-01-24 DIAGNOSIS — D509 Iron deficiency anemia, unspecified: Secondary | ICD-10-CM | POA: Diagnosis not present

## 2017-01-24 DIAGNOSIS — I509 Heart failure, unspecified: Secondary | ICD-10-CM | POA: Diagnosis not present

## 2017-01-24 DIAGNOSIS — R970 Elevated carcinoembryonic antigen [CEA]: Secondary | ICD-10-CM | POA: Diagnosis not present

## 2017-01-24 DIAGNOSIS — N189 Chronic kidney disease, unspecified: Secondary | ICD-10-CM | POA: Diagnosis not present

## 2017-01-24 DIAGNOSIS — R1012 Left upper quadrant pain: Secondary | ICD-10-CM | POA: Diagnosis not present

## 2017-01-24 DIAGNOSIS — Z85118 Personal history of other malignant neoplasm of bronchus and lung: Secondary | ICD-10-CM | POA: Diagnosis not present

## 2017-01-24 DIAGNOSIS — H547 Unspecified visual loss: Secondary | ICD-10-CM | POA: Diagnosis not present

## 2017-02-15 DIAGNOSIS — Z23 Encounter for immunization: Secondary | ICD-10-CM | POA: Diagnosis not present

## 2017-02-22 ENCOUNTER — Other Ambulatory Visit: Payer: Self-pay | Admitting: Cardiology

## 2017-02-22 DIAGNOSIS — Z947 Corneal transplant status: Secondary | ICD-10-CM | POA: Diagnosis not present

## 2017-02-27 ENCOUNTER — Ambulatory Visit (HOSPITAL_COMMUNITY): Payer: Medicare Other

## 2017-02-27 DIAGNOSIS — R1084 Generalized abdominal pain: Secondary | ICD-10-CM | POA: Diagnosis not present

## 2017-02-27 DIAGNOSIS — C3412 Malignant neoplasm of upper lobe, left bronchus or lung: Secondary | ICD-10-CM | POA: Diagnosis not present

## 2017-02-27 DIAGNOSIS — N200 Calculus of kidney: Secondary | ICD-10-CM | POA: Diagnosis not present

## 2017-02-27 DIAGNOSIS — M419 Scoliosis, unspecified: Secondary | ICD-10-CM | POA: Diagnosis not present

## 2017-02-27 DIAGNOSIS — I700 Atherosclerosis of aorta: Secondary | ICD-10-CM | POA: Diagnosis not present

## 2017-02-27 DIAGNOSIS — C3492 Malignant neoplasm of unspecified part of left bronchus or lung: Secondary | ICD-10-CM | POA: Diagnosis not present

## 2017-02-27 DIAGNOSIS — J439 Emphysema, unspecified: Secondary | ICD-10-CM | POA: Diagnosis not present

## 2017-03-01 ENCOUNTER — Telehealth: Payer: Self-pay | Admitting: *Deleted

## 2017-03-01 DIAGNOSIS — Z77090 Contact with and (suspected) exposure to asbestos: Secondary | ICD-10-CM

## 2017-03-01 DIAGNOSIS — I509 Heart failure, unspecified: Secondary | ICD-10-CM | POA: Diagnosis not present

## 2017-03-01 DIAGNOSIS — J449 Chronic obstructive pulmonary disease, unspecified: Secondary | ICD-10-CM | POA: Diagnosis not present

## 2017-03-01 DIAGNOSIS — Z951 Presence of aortocoronary bypass graft: Secondary | ICD-10-CM | POA: Diagnosis not present

## 2017-03-01 DIAGNOSIS — E860 Dehydration: Secondary | ICD-10-CM | POA: Diagnosis not present

## 2017-03-01 DIAGNOSIS — R970 Elevated carcinoembryonic antigen [CEA]: Secondary | ICD-10-CM | POA: Diagnosis not present

## 2017-03-01 DIAGNOSIS — Z85118 Personal history of other malignant neoplasm of bronchus and lung: Secondary | ICD-10-CM | POA: Diagnosis not present

## 2017-03-01 DIAGNOSIS — I2510 Atherosclerotic heart disease of native coronary artery without angina pectoris: Secondary | ICD-10-CM | POA: Diagnosis not present

## 2017-03-01 DIAGNOSIS — Z947 Corneal transplant status: Secondary | ICD-10-CM | POA: Diagnosis not present

## 2017-03-01 DIAGNOSIS — R918 Other nonspecific abnormal finding of lung field: Secondary | ICD-10-CM | POA: Diagnosis not present

## 2017-03-01 DIAGNOSIS — Z8673 Personal history of transient ischemic attack (TIA), and cerebral infarction without residual deficits: Secondary | ICD-10-CM | POA: Diagnosis not present

## 2017-03-01 DIAGNOSIS — H547 Unspecified visual loss: Secondary | ICD-10-CM | POA: Diagnosis not present

## 2017-03-01 DIAGNOSIS — M810 Age-related osteoporosis without current pathological fracture: Secondary | ICD-10-CM | POA: Diagnosis not present

## 2017-03-01 DIAGNOSIS — N189 Chronic kidney disease, unspecified: Secondary | ICD-10-CM | POA: Diagnosis not present

## 2017-03-01 DIAGNOSIS — D509 Iron deficiency anemia, unspecified: Secondary | ICD-10-CM | POA: Diagnosis not present

## 2017-03-01 DIAGNOSIS — H548 Legal blindness, as defined in USA: Secondary | ICD-10-CM | POA: Diagnosis not present

## 2017-03-01 NOTE — Telephone Encounter (Signed)
CALLED PATIENT TO INFORM OF STAT LABS ON 03-05-17 @ 12:45 PM AND HIS CT ON 03-05-17 @ 2 PM , PT. TO HAVE CLEAR LIQUIDS ONLY - 4 HRS. PRIOR TO TEST, TEST TO BE @ [facility], SPOKE WITH PATIENT AND HE IS AWARE OF THESE APPTS.

## 2017-03-05 ENCOUNTER — Other Ambulatory Visit: Payer: Self-pay | Admitting: *Deleted

## 2017-03-05 ENCOUNTER — Ambulatory Visit: Payer: Medicare Other

## 2017-03-05 ENCOUNTER — Ambulatory Visit (HOSPITAL_COMMUNITY): Admission: RE | Admit: 2017-03-05 | Payer: Medicare Other | Source: Ambulatory Visit

## 2017-03-05 DIAGNOSIS — C3491 Malignant neoplasm of unspecified part of right bronchus or lung: Secondary | ICD-10-CM

## 2017-03-06 DIAGNOSIS — I700 Atherosclerosis of aorta: Secondary | ICD-10-CM | POA: Diagnosis not present

## 2017-03-06 DIAGNOSIS — Z85118 Personal history of other malignant neoplasm of bronchus and lung: Secondary | ICD-10-CM | POA: Diagnosis not present

## 2017-03-06 DIAGNOSIS — I25810 Atherosclerosis of coronary artery bypass graft(s) without angina pectoris: Secondary | ICD-10-CM | POA: Diagnosis not present

## 2017-03-06 DIAGNOSIS — C3412 Malignant neoplasm of upper lobe, left bronchus or lung: Secondary | ICD-10-CM | POA: Diagnosis not present

## 2017-03-06 DIAGNOSIS — J949 Pleural condition, unspecified: Secondary | ICD-10-CM | POA: Diagnosis not present

## 2017-03-07 ENCOUNTER — Telehealth: Payer: Self-pay | Admitting: Radiation Oncology

## 2017-03-07 NOTE — Telephone Encounter (Signed)
I spoke with the patient's son about the findings on his recent CT scan. We discussed that after reviewing, Dr. Lisbeth Renshaw and I would offer PET scan versus repeat. Ronalee Belts has spoken with Dr. Hinton Rao already and they're planning to reimage in January 2019, and if progressive changes are noted proceed with PET scan. He thanked [place] for our attention to his dad's care, and will continue follow up with Dr. Hinton Rao, and see [place] if she sees it necessary in the future.     Carola Rhine, PAC

## 2017-03-12 DIAGNOSIS — L570 Actinic keratosis: Secondary | ICD-10-CM | POA: Diagnosis not present

## 2017-03-19 DIAGNOSIS — Z6820 Body mass index (BMI) 20.0-20.9, adult: Secondary | ICD-10-CM | POA: Diagnosis not present

## 2017-03-19 DIAGNOSIS — Z85118 Personal history of other malignant neoplasm of bronchus and lung: Secondary | ICD-10-CM | POA: Diagnosis not present

## 2017-03-19 DIAGNOSIS — I739 Peripheral vascular disease, unspecified: Secondary | ICD-10-CM | POA: Diagnosis not present

## 2017-03-19 DIAGNOSIS — I5032 Chronic diastolic (congestive) heart failure: Secondary | ICD-10-CM | POA: Diagnosis not present

## 2017-03-19 DIAGNOSIS — I679 Cerebrovascular disease, unspecified: Secondary | ICD-10-CM | POA: Diagnosis not present

## 2017-03-19 DIAGNOSIS — E039 Hypothyroidism, unspecified: Secondary | ICD-10-CM | POA: Diagnosis not present

## 2017-03-19 DIAGNOSIS — E7800 Pure hypercholesterolemia, unspecified: Secondary | ICD-10-CM | POA: Diagnosis not present

## 2017-03-19 DIAGNOSIS — I2510 Atherosclerotic heart disease of native coronary artery without angina pectoris: Secondary | ICD-10-CM | POA: Diagnosis not present

## 2017-03-19 DIAGNOSIS — Z1389 Encounter for screening for other disorder: Secondary | ICD-10-CM | POA: Diagnosis not present

## 2017-03-19 DIAGNOSIS — J449 Chronic obstructive pulmonary disease, unspecified: Secondary | ICD-10-CM | POA: Diagnosis not present

## 2017-03-19 DIAGNOSIS — I10 Essential (primary) hypertension: Secondary | ICD-10-CM | POA: Diagnosis not present

## 2017-03-19 DIAGNOSIS — N183 Chronic kidney disease, stage 3 (moderate): Secondary | ICD-10-CM | POA: Diagnosis not present

## 2017-03-27 DIAGNOSIS — J4530 Mild persistent asthma, uncomplicated: Secondary | ICD-10-CM | POA: Diagnosis not present

## 2017-03-27 DIAGNOSIS — C3412 Malignant neoplasm of upper lobe, left bronchus or lung: Secondary | ICD-10-CM | POA: Diagnosis not present

## 2017-04-01 DIAGNOSIS — J441 Chronic obstructive pulmonary disease with (acute) exacerbation: Secondary | ICD-10-CM | POA: Diagnosis not present

## 2017-04-03 DIAGNOSIS — J988 Other specified respiratory disorders: Secondary | ICD-10-CM | POA: Diagnosis not present

## 2017-04-03 DIAGNOSIS — I2510 Atherosclerotic heart disease of native coronary artery without angina pectoris: Secondary | ICD-10-CM | POA: Diagnosis not present

## 2017-04-03 DIAGNOSIS — J441 Chronic obstructive pulmonary disease with (acute) exacerbation: Secondary | ICD-10-CM | POA: Diagnosis not present

## 2017-04-03 DIAGNOSIS — C3412 Malignant neoplasm of upper lobe, left bronchus or lung: Secondary | ICD-10-CM | POA: Diagnosis not present

## 2017-04-03 DIAGNOSIS — M810 Age-related osteoporosis without current pathological fracture: Secondary | ICD-10-CM | POA: Diagnosis not present

## 2017-04-03 DIAGNOSIS — E559 Vitamin D deficiency, unspecified: Secondary | ICD-10-CM | POA: Diagnosis not present

## 2017-04-03 DIAGNOSIS — N189 Chronic kidney disease, unspecified: Secondary | ICD-10-CM | POA: Diagnosis not present

## 2017-04-03 DIAGNOSIS — Z85118 Personal history of other malignant neoplasm of bronchus and lung: Secondary | ICD-10-CM | POA: Diagnosis not present

## 2017-04-03 DIAGNOSIS — Z951 Presence of aortocoronary bypass graft: Secondary | ICD-10-CM | POA: Diagnosis not present

## 2017-04-03 DIAGNOSIS — R918 Other nonspecific abnormal finding of lung field: Secondary | ICD-10-CM | POA: Diagnosis not present

## 2017-04-03 DIAGNOSIS — Z77090 Contact with and (suspected) exposure to asbestos: Secondary | ICD-10-CM | POA: Diagnosis not present

## 2017-04-03 DIAGNOSIS — E7800 Pure hypercholesterolemia, unspecified: Secondary | ICD-10-CM | POA: Diagnosis not present

## 2017-04-03 DIAGNOSIS — R970 Elevated carcinoembryonic antigen [CEA]: Secondary | ICD-10-CM | POA: Diagnosis not present

## 2017-04-03 DIAGNOSIS — D509 Iron deficiency anemia, unspecified: Secondary | ICD-10-CM | POA: Diagnosis not present

## 2017-04-03 DIAGNOSIS — R062 Wheezing: Secondary | ICD-10-CM | POA: Diagnosis not present

## 2017-04-03 DIAGNOSIS — J449 Chronic obstructive pulmonary disease, unspecified: Secondary | ICD-10-CM | POA: Diagnosis not present

## 2017-04-03 DIAGNOSIS — I509 Heart failure, unspecified: Secondary | ICD-10-CM | POA: Diagnosis not present

## 2017-04-10 ENCOUNTER — Ambulatory Visit (INDEPENDENT_AMBULATORY_CARE_PROVIDER_SITE_OTHER): Payer: Medicare Other | Admitting: Cardiovascular Disease

## 2017-04-10 ENCOUNTER — Encounter: Payer: Self-pay | Admitting: Cardiovascular Disease

## 2017-04-10 ENCOUNTER — Other Ambulatory Visit: Payer: Self-pay | Admitting: *Deleted

## 2017-04-10 VITALS — BP 146/72 | HR 74 | Ht 68.0 in | Wt 139.0 lb

## 2017-04-10 DIAGNOSIS — R0602 Shortness of breath: Secondary | ICD-10-CM | POA: Diagnosis not present

## 2017-04-10 DIAGNOSIS — I200 Unstable angina: Secondary | ICD-10-CM

## 2017-04-10 DIAGNOSIS — I10 Essential (primary) hypertension: Secondary | ICD-10-CM | POA: Diagnosis not present

## 2017-04-10 DIAGNOSIS — I739 Peripheral vascular disease, unspecified: Secondary | ICD-10-CM | POA: Diagnosis not present

## 2017-04-10 DIAGNOSIS — J441 Chronic obstructive pulmonary disease with (acute) exacerbation: Secondary | ICD-10-CM | POA: Diagnosis not present

## 2017-04-10 DIAGNOSIS — I209 Angina pectoris, unspecified: Secondary | ICD-10-CM

## 2017-04-10 DIAGNOSIS — Z951 Presence of aortocoronary bypass graft: Secondary | ICD-10-CM | POA: Diagnosis not present

## 2017-04-10 DIAGNOSIS — R05 Cough: Secondary | ICD-10-CM | POA: Diagnosis not present

## 2017-04-10 DIAGNOSIS — I25118 Atherosclerotic heart disease of native coronary artery with other forms of angina pectoris: Secondary | ICD-10-CM | POA: Diagnosis not present

## 2017-04-10 DIAGNOSIS — I6523 Occlusion and stenosis of bilateral carotid arteries: Secondary | ICD-10-CM

## 2017-04-10 NOTE — Patient Instructions (Signed)

## 2017-04-10 NOTE — Assessment & Plan Note (Signed)
History of hyperlipidemia not on statin therapy followed by his PCP 

## 2017-04-10 NOTE — Assessment & Plan Note (Signed)
History of carotid artery disease with duplex performed 01/26/16 revealing moderate right and mild to moderate left ICA stenosis. He is scheduled to have this rechecked on annual basis.

## 2017-04-10 NOTE — Assessment & Plan Note (Signed)
History of CAD status post intervention back in 1999 and again 2004 by Dr. Maurene Capes. He ultimately underwent coronary artery bypass grafting by Dr. Roxy Manns 11/20/13 with a LIMA to his LAD and a vein to the circumflex coronary artery off pump. He underwent cardiac catheterization by Dr. Burt Knack 08/20/16 and was found to have an 80% "in-stent restenosis in the previously placed RCA stent which was restented. Because of recurrent symptoms I recatheterized him 10/29/16 revealing unchanged anatomy. Since that time he has had no chest pain.

## 2017-04-10 NOTE — Progress Notes (Signed)
04/10/2017 Glenn Reilly   1928-05-29  [id number]  Primary Physician Townsend Roger, MD Primary Cardiologist: Lorretta Harp MD Lupe Carney, [place]  HPI:  Glenn Reilly is a 83 y.o. male thin appearing widowed Caucasian male father of one son who accompanies him today. I last saw him in the office 10/26/16.Marland Kitchen He was referred by Dr. Vassie Loll Eyk His cardiac risk factor profile is remarkable for remote tobacco abuse, hyperlipidemia and hypertension. He has never had a heart attack but did have a stroke in 2011 at the time of left carotid endarterectomy. He said coronary artery disease with stenting in 1999 performed by Dr. Eustace Quail and again 2004. His left calf because k patent stent with moderate LAD and circumflex disease. He does have diastolic dysfunction. He has had lung cancer and right lung resection by Dr. Baldemar Friday in 2001. On Thursday night after mowing his lawn on a riding lower developed back pain rating to his chest associated with dyspnea on exertion. This resolved throughout the night has not recurred. He did have apparently inferior T-wave inversion on EKG today which was new. A Myoview stress test was normal as was a 2-D echo echocardiogram. Lower extremity arterial Doppler studies showed ankle-brachial indices of greater than one bilaterally with a suggestion of mild to moderate iliac disease. He developed exertional left upper extremity discomfort with dyspnea over the last month which is fairly reproducible and similar to his prior symptoms before coronary intervention. Based on this, I performed outpatient cardiac catheterization on 11/05/13 in the right radial approach. I demonstrated 50% left main, high-grade proximal LAD, ostial circumflex and moderate mid RCA disease with normal LV function. Based on this, I suggested coronary artery bypass grafting is the best revascularization strategy especially in light of his pulmonary nodule which could potentially be  biopsied at the time of surgery. This was performed by Dr. Roxy Manns on 11/20/13 with a LIMA to his LAD and a vein to the circumflex coronary artery. He also had a Tru-Cut needle biopsy of his left upper lobe nodule which turned out to be some squamous cell carcinoma. He has received radiation therapy for this. He did participate in cardiac rehabilitation. A chest CT showed diminished size of his nodule. He was hospitalized for pneumonia and was told he had "congestive heart failure. A BNP was mildly elevated. He did have lower extremity edema and his Lasix was increased. A subsequent 2-D echo performed in March of last year showed preserved LV function with diastolic dysfunction. He saw Almyra Deforest [facility] in the office recently complaining of chest pain and a Myoview stress test was performed which showed normal LV systolic function with normal perfusion. His chest pain has subsequently resolved. He was hospitalized at [facility] last month with pneumonia and flu. He had some increased lotion edema which responded to adjusting his diuretic dose. Since I saw him in the office a year ago he did develop left upper extremity discomfort similar to his previous coronary symptoms and saw Almyra Deforest in the office on 08/15/16 range for him to undergo outpatient cardiac cath by Dr. Burt Knack 08/20/16. He was found to have an 80% "in-stent restenosis" within the previously placed RCA stent which was restented. He was discharged on the same day. His symptoms resolved after that however several weeks later he had recurrent symptoms similar to his pre-stent symptoms with chest pain, shortness of breath and left arm pain. These have become progressively worse.  Because  of recurrent chest pain I performed cardiac catheterization on him 10/29/16 revealing unchanged anatomy compared to his previous. He has had no chest pain since I saw him. He does complain of dyspnea and lower extremity edema probably related to diastolic dysfunction on  diuretics (Lasix 20 mg alternating with 40 mg).   Current Meds  Medication Sig  . albuterol (PROVENTIL HFA;VENTOLIN HFA) 108 (90 BASE) MCG/ACT inhaler Inhale 2 puffs into the lungs every 6 (six) hours as needed for wheezing or shortness of breath.  . allopurinol (ZYLOPRIM) 100 MG tablet Take 100 mg by mouth 2 (two) times daily.  Marland Kitchen amLODipine (NORVASC) 5 MG tablet Take 1 tablet (5 mg total) by mouth daily.  Marland Kitchen aspirin 81 MG tablet Take 1 tablet (81 mg total) by mouth at bedtime.  . fluticasone (FLONASE) 50 MCG/ACT nasal spray Place 2 sprays into both nostrils daily.  . furosemide (LASIX) 20 MG tablet Take 20 mg by mouth every other day. TFSS  . furosemide (LASIX) 40 MG tablet Take 40 mg by mouth every other day. MWF  . ipratropium-albuterol (DUONEB) 0.5-2.5 (3) MG/3ML SOLN Take 3 mLs by nebulization 4 (four) times daily.   Marland Kitchen levothyroxine (SYNTHROID, LEVOTHROID) 75 MCG tablet Take 75 mcg by mouth daily before breakfast.  . lisinopril (PRINIVIL,ZESTRIL) 20 MG tablet Take 1 tablet (20 mg total) by mouth daily.  . metoprolol tartrate (LOPRESSOR) 25 MG tablet Take 0.5 tablets (12.5 mg total) by mouth 2 (two) times daily.  . montelukast (SINGULAIR) 10 MG tablet Take 10 mg by mouth at bedtime.  . pantoprazole (PROTONIX) 40 MG tablet Take 1 tablet (40 mg total) by mouth 2 (two) times daily.  . predniSONE (DELTASONE) 10 MG tablet Take 10 mg by mouth daily with breakfast. Taking steroid taper 40 mg for one week 30 mg x one week, 20 mg for a week then return to 10 mg daily     Allergies  Allergen Reactions  . Demerol [Meperidine] Anaphylaxis  . Statins Other (See Comments)    Myalgia     Social History   Socioeconomic History  . Marital status: Widowed    Spouse name: Not on file  . Number of children: 1  . Years of education: Not on file  . Highest education level: Not on file  Social Needs  . Financial resource strain: Not on file  . Food insecurity - worry: Not on file  . Food  insecurity - inability: Not on file  . Transportation needs - medical: Not on file  . Transportation needs - non-medical: Not on file  Occupational History  . 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 Smoking status: Former Smoker    Packs/day: 1.00    Years: 30.00    Pack years: 30.00    Types: Cigarettes    Last attempt to quit: 03/03/1979    Years since quitting: 38.1  . Smokeless tobacco: Current User    Types: Chew  Substance and Sexual Activity  . Alcohol use: No  . Drug use: No  . Sexual activity: Not on file  Other Topics Concern  . Not on file  Social History Narrative  . Not on file     Review of Systems: General: negative for chills, fever, night sweats or weight changes.  Cardiovascular: negative for chest pain, dyspnea on exertion, edema, orthopnea, palpitations, paroxysmal nocturnal dyspnea or shortness of breath Dermatological: negative for rash Respiratory: negative for cough or wheezing Urologic:  negative for hematuria Abdominal: negative for nausea, vomiting, diarrhea, bright red blood per rectum, melena, or hematemesis Neurologic: negative for visual changes, syncope, or dizziness All other systems reviewed and are otherwise negative except as noted above.    Blood pressure (!) 146/72, pulse 74, height 5\' 8"  (1.727 m), weight 139 lb (63 kg).  General appearance: alert and no distress Neck: no adenopathy, no carotid bruit, no JVD, supple, symmetrical, trachea midline and thyroid not enlarged, symmetric, no tenderness/mass/nodules Lungs: clear to auscultation bilaterally Heart: regular rate and rhythm, S1, S2 normal, no murmur, click, rub or gallop Extremities: extremities normal, atraumatic, no cyanosis or edema Pulses: 2+ and symmetric Skin: Skin color, texture, turgor normal. No rashes or lesions Neurologic: Alert and oriented X 3, normal strength and tone. Normal symmetric reflexes. Normal coordination  and gait  EKG sinus rhythm at 74 with right bundle branch block and left axis deviation along with LVH. I personally reviewed this EKG.  ASSESSMENT AND PLAN:   HYPERLIPIDEMIA-MIXED History of hyperlipidemia not on statin therapy followed by his PCP  HYPERTENSION, BENIGN History of essential hypertension blood pressure measured at 146/72. He is on lisinopril and metoprolol as well as amlodipine. Continue current meds at current dosing.  S/P Off-pump CABG x 2 History of CAD status post intervention back in 1999 and again 2004 by Dr. Maurene Capes. He ultimately underwent coronary artery bypass grafting by Dr. Roxy Manns 11/20/13 with a LIMA to his LAD and a vein to the circumflex coronary artery off pump. He underwent cardiac catheterization by Dr. Burt Knack 08/20/16 and was found to have an 80% "in-stent restenosis in the previously placed RCA stent which was restented. Because of recurrent symptoms I recatheterized him 10/29/16 revealing unchanged anatomy. Since that time he has had no chest pain.  Carotid artery disease History of carotid artery disease with duplex performed 01/26/16 revealing moderate right and mild to moderate left ICA stenosis. He is scheduled to have this rechecked on annual basis.      Lorretta Harp MD FACP,FACC,FAHA, [facility] 04/10/2017 11:44 AM

## 2017-04-10 NOTE — Assessment & Plan Note (Signed)
History of essential hypertension blood pressure measured at 146/72. He is on lisinopril and metoprolol as well as amlodipine. Continue current meds at current dosing.

## 2017-05-09 ENCOUNTER — Other Ambulatory Visit: Payer: Self-pay | Admitting: Cardiovascular Disease

## 2017-05-24 DIAGNOSIS — Z947 Corneal transplant status: Secondary | ICD-10-CM | POA: Diagnosis not present

## 2017-06-03 DIAGNOSIS — J439 Emphysema, unspecified: Secondary | ICD-10-CM | POA: Diagnosis not present

## 2017-06-03 DIAGNOSIS — C3412 Malignant neoplasm of upper lobe, left bronchus or lung: Secondary | ICD-10-CM | POA: Diagnosis not present

## 2017-06-03 DIAGNOSIS — I700 Atherosclerosis of aorta: Secondary | ICD-10-CM | POA: Diagnosis not present

## 2017-06-05 DIAGNOSIS — I2510 Atherosclerotic heart disease of native coronary artery without angina pectoris: Secondary | ICD-10-CM | POA: Diagnosis not present

## 2017-06-05 DIAGNOSIS — R918 Other nonspecific abnormal finding of lung field: Secondary | ICD-10-CM | POA: Diagnosis not present

## 2017-06-05 DIAGNOSIS — Z955 Presence of coronary angioplasty implant and graft: Secondary | ICD-10-CM | POA: Diagnosis not present

## 2017-06-05 DIAGNOSIS — Z85118 Personal history of other malignant neoplasm of bronchus and lung: Secondary | ICD-10-CM | POA: Diagnosis not present

## 2017-06-05 DIAGNOSIS — R970 Elevated carcinoembryonic antigen [CEA]: Secondary | ICD-10-CM | POA: Diagnosis not present

## 2017-06-05 DIAGNOSIS — Z77090 Contact with and (suspected) exposure to asbestos: Secondary | ICD-10-CM | POA: Diagnosis not present

## 2017-06-05 DIAGNOSIS — Z8673 Personal history of transient ischemic attack (TIA), and cerebral infarction without residual deficits: Secondary | ICD-10-CM | POA: Diagnosis not present

## 2017-06-05 DIAGNOSIS — E860 Dehydration: Secondary | ICD-10-CM | POA: Diagnosis not present

## 2017-06-05 DIAGNOSIS — I509 Heart failure, unspecified: Secondary | ICD-10-CM | POA: Diagnosis not present

## 2017-06-05 DIAGNOSIS — D509 Iron deficiency anemia, unspecified: Secondary | ICD-10-CM | POA: Diagnosis not present

## 2017-06-05 DIAGNOSIS — N189 Chronic kidney disease, unspecified: Secondary | ICD-10-CM | POA: Diagnosis not present

## 2017-06-05 DIAGNOSIS — H548 Legal blindness, as defined in USA: Secondary | ICD-10-CM | POA: Diagnosis not present

## 2017-06-05 DIAGNOSIS — M810 Age-related osteoporosis without current pathological fracture: Secondary | ICD-10-CM | POA: Diagnosis not present

## 2017-06-05 DIAGNOSIS — C3412 Malignant neoplasm of upper lobe, left bronchus or lung: Secondary | ICD-10-CM | POA: Diagnosis not present

## 2017-06-05 DIAGNOSIS — J449 Chronic obstructive pulmonary disease, unspecified: Secondary | ICD-10-CM | POA: Diagnosis not present

## 2017-06-18 DIAGNOSIS — J441 Chronic obstructive pulmonary disease with (acute) exacerbation: Secondary | ICD-10-CM | POA: Diagnosis not present

## 2017-06-18 DIAGNOSIS — I10 Essential (primary) hypertension: Secondary | ICD-10-CM | POA: Diagnosis not present

## 2017-06-18 DIAGNOSIS — E785 Hyperlipidemia, unspecified: Secondary | ICD-10-CM | POA: Diagnosis not present

## 2017-06-18 DIAGNOSIS — E039 Hypothyroidism, unspecified: Secondary | ICD-10-CM | POA: Diagnosis not present

## 2017-07-19 DIAGNOSIS — Z947 Corneal transplant status: Secondary | ICD-10-CM | POA: Diagnosis not present

## 2017-08-09 DIAGNOSIS — K219 Gastro-esophageal reflux disease without esophagitis: Secondary | ICD-10-CM | POA: Diagnosis not present

## 2017-08-09 DIAGNOSIS — R1310 Dysphagia, unspecified: Secondary | ICD-10-CM | POA: Diagnosis not present

## 2017-08-14 DIAGNOSIS — H543 Unqualified visual loss, both eyes: Secondary | ICD-10-CM | POA: Diagnosis not present

## 2017-08-14 DIAGNOSIS — I509 Heart failure, unspecified: Secondary | ICD-10-CM | POA: Diagnosis not present

## 2017-08-14 DIAGNOSIS — Z8673 Personal history of transient ischemic attack (TIA), and cerebral infarction without residual deficits: Secondary | ICD-10-CM | POA: Diagnosis not present

## 2017-08-14 DIAGNOSIS — E860 Dehydration: Secondary | ICD-10-CM | POA: Diagnosis not present

## 2017-08-14 DIAGNOSIS — I2510 Atherosclerotic heart disease of native coronary artery without angina pectoris: Secondary | ICD-10-CM | POA: Diagnosis not present

## 2017-08-14 DIAGNOSIS — J449 Chronic obstructive pulmonary disease, unspecified: Secondary | ICD-10-CM | POA: Diagnosis not present

## 2017-08-14 DIAGNOSIS — R970 Elevated carcinoembryonic antigen [CEA]: Secondary | ICD-10-CM | POA: Diagnosis not present

## 2017-08-14 DIAGNOSIS — C3412 Malignant neoplasm of upper lobe, left bronchus or lung: Secondary | ICD-10-CM | POA: Diagnosis not present

## 2017-08-14 DIAGNOSIS — Z85118 Personal history of other malignant neoplasm of bronchus and lung: Secondary | ICD-10-CM | POA: Diagnosis not present

## 2017-08-14 DIAGNOSIS — Z951 Presence of aortocoronary bypass graft: Secondary | ICD-10-CM | POA: Diagnosis not present

## 2017-08-14 DIAGNOSIS — N189 Chronic kidney disease, unspecified: Secondary | ICD-10-CM | POA: Diagnosis not present

## 2017-08-14 DIAGNOSIS — Z77090 Contact with and (suspected) exposure to asbestos: Secondary | ICD-10-CM | POA: Diagnosis not present

## 2017-08-14 DIAGNOSIS — M810 Age-related osteoporosis without current pathological fracture: Secondary | ICD-10-CM | POA: Diagnosis not present

## 2017-08-14 DIAGNOSIS — D509 Iron deficiency anemia, unspecified: Secondary | ICD-10-CM | POA: Diagnosis not present

## 2017-08-14 DIAGNOSIS — R918 Other nonspecific abnormal finding of lung field: Secondary | ICD-10-CM | POA: Diagnosis not present

## 2017-08-19 ENCOUNTER — Encounter: Payer: Self-pay | Admitting: Gastroenterology

## 2017-08-28 DIAGNOSIS — R1310 Dysphagia, unspecified: Secondary | ICD-10-CM | POA: Diagnosis not present

## 2017-08-28 DIAGNOSIS — K449 Diaphragmatic hernia without obstruction or gangrene: Secondary | ICD-10-CM | POA: Diagnosis not present

## 2017-08-28 DIAGNOSIS — K228 Other specified diseases of esophagus: Secondary | ICD-10-CM | POA: Diagnosis not present

## 2017-09-10 DIAGNOSIS — D044 Carcinoma in situ of skin of scalp and neck: Secondary | ICD-10-CM | POA: Diagnosis not present

## 2017-09-10 DIAGNOSIS — L570 Actinic keratosis: Secondary | ICD-10-CM | POA: Diagnosis not present

## 2017-09-10 DIAGNOSIS — D0462 Carcinoma in situ of skin of left upper limb, including shoulder: Secondary | ICD-10-CM | POA: Diagnosis not present

## 2017-09-11 DIAGNOSIS — H16002 Unspecified corneal ulcer, left eye: Secondary | ICD-10-CM | POA: Diagnosis not present

## 2017-09-11 DIAGNOSIS — T8130XA Disruption of wound, unspecified, initial encounter: Secondary | ICD-10-CM | POA: Diagnosis not present

## 2017-09-12 DIAGNOSIS — T8130XA Disruption of wound, unspecified, initial encounter: Secondary | ICD-10-CM | POA: Diagnosis not present

## 2017-09-13 DIAGNOSIS — T8130XA Disruption of wound, unspecified, initial encounter: Secondary | ICD-10-CM | POA: Diagnosis not present

## 2017-09-16 ENCOUNTER — Encounter: Payer: Self-pay | Admitting: Gastroenterology

## 2017-09-16 DIAGNOSIS — T8130XA Disruption of wound, unspecified, initial encounter: Secondary | ICD-10-CM | POA: Diagnosis not present

## 2017-09-17 ENCOUNTER — Ambulatory Visit (INDEPENDENT_AMBULATORY_CARE_PROVIDER_SITE_OTHER): Payer: Medicare Other | Admitting: Gastroenterology

## 2017-09-17 ENCOUNTER — Encounter

## 2017-09-17 ENCOUNTER — Encounter: Payer: Self-pay | Admitting: Gastroenterology

## 2017-09-17 VITALS — BP 146/72 | HR 64 | Ht 68.0 in | Wt 136.0 lb

## 2017-09-17 DIAGNOSIS — K219 Gastro-esophageal reflux disease without esophagitis: Secondary | ICD-10-CM | POA: Diagnosis not present

## 2017-09-17 DIAGNOSIS — R1310 Dysphagia, unspecified: Secondary | ICD-10-CM | POA: Diagnosis not present

## 2017-09-17 NOTE — Progress Notes (Signed)
Chief Complaint:   Referring Provider:  Townsend Roger, MD      ASSESSMENT AND PLAN;   #1. Esophageal dysphagia - better. S/P EGD with dilatation of Schatzki's ring 02/2013.  EGD showed 5 to 6 cm hiatal hernia and presbyesophagus  #2.  GERD with hiatal hernia  - Ba swallow report from Dr Gaylyn Cheers - Protonix 40 mg po qd. Add zantac 75 mg po qhs. - He wants to avoid EGD at this time. Had eye infection corneal transplant  01/2017, now with staph inf L eye. - Chop all meats. Feed in sitting position. - Await record de-conversion from [place]. -I have discussed extensively with the patient's son.  Due to multiple comorbid conditions, he also would like a conservative approach.   HPI:   84 year old who is currently blind with multiple medical problems including history of lung cancer x 2, with significant postnasal drip, significant cough.  He was evaluated by Dr. Gaylyn Cheers (ENT) in [facility] and had several nasopharyngoscopy's.  He was also complaining of some solid food dysphagia.  He underwent barium swallow.  We do not have records at the present time.  Patient comes to the GI clinic.  He has problems swallowing intermittently.  However, he does not want to undergo EGD.   Past Medical History:  Diagnosis Date  . Asbestosis ([place])   . Asthma   . CAD, [place] PCI 2004 Cath 11/05/2013 with 50% LMain, 75% prox LAD, 80% prox LCx, 50% RCA with normal EF  08/20/16 Cath with PCI DES--> RCA  . Carotid artery disease ([place])   . Cataract   . Complication of anesthesia    lt side throat parlysis  . COPD with emphysema ([place]) 01/13/2010   Spirometry 10/19/2013 is reviewed. This reveals FEV1 44% predicted FVC 47% predicted and FEF 25 7537% predicted   . Detached retina   . Dysphagia as late effect of stroke    Developed as complication of stroke following left carotid endarterectomy with documented left hypoglossal nerve paralysis   . Factor 5 Leiden mutation, heterozygous ([place])   . GI  bleed 08/2016  . 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 HTN (hypertension)   . Hyperlipemia   . HYPERLIPIDEMIA-MIXED 01/13/2010   Qualifier: Diagnosis of  By: Olevia Perches, MD, Glenetta Hew   . HYPERTENSION, BENIGN 01/13/2010   Qualifier: Diagnosis of  By: Olevia Perches, MD, Glenetta Hew   . Hypothyroidism   . Lung cancer ([place]) 2001   lower right lobe surgery to remove cancer, no chemo or radiation  . Lung mass 10/19/2013   CT of the chest 10/12/2013 reveals an enlarging left upper lobe irregular nodule. Now 1.4 compared to 1.0 cm in diameter when compared to March 2015 CT scan. There is calcified pleural plaques seen bilaterally. There is subpleural scarring seen as well. Previous right lower lobectomy has been demonstrated.  PET-CT scan from June 2015 is reviewed this shows increased uptake (SUV > 8) in left upper   . PVD 01/13/2010   Qualifier: Diagnosis of  By: Olevia Perches, MD, Glenetta Hew   . PVD (peripheral vascular disease) ([place]) 2011   carotid endarterectomy, pv stents  . Radiation 9/1, 9/3, 01/19/14   Left upper lung 3 fractions  . S/P CABG x 2 11/20/2013   Off-pump  CABG x2 using LIMA to LAD, SVG to OM1, EVH via right thigh  . Stroke [facility]) 07/2009   left carotid endarterectomy    Past Surgical History:  Procedure Laterality Date  . APPENDECTOMY    . BIOPSY Left 11/20/2013   Procedure: BIOPSY Left Upper Lobe;  Surgeon: Rexene Alberts, MD;  Location: [place];  Service: Open Heart Surgery;  Laterality: Left;  . CAROTID ENDARTERECTOMY Left 2011   [facility] - complicated by post-op stroke  . CORONARY ARTERY BYPASS GRAFT N/A 11/20/2013   Procedure: OFF PUMP CORONARY ARTERY BYPASS GRAFTING (CABG) x2: LIMA to LAD, SVG to Obtuse Marginal 1.;  Surgeon: Rexene Alberts, MD;  Location: [place];  Service: Open Heart Surgery;  Laterality:  N/A;  . CORONARY STENT INTERVENTION N/A 08/20/2016   Procedure: Coronary Stent Intervention;  Surgeon: Sherren Mocha, MD;  Location: [place] CV LAB;  Service: Cardiovascular;  Laterality: N/A;  . FRACTURE SURGERY Left    foot  . HEMORRHOID SURGERY    . ILIAC ARTERY STENT Bilateral 2011   [facility] - performed for asymptomatic iliac artery disease  . INTRAOPERATIVE TRANSESOPHAGEAL ECHOCARDIOGRAM N/A 11/20/2013   Procedure: INTRAOPERATIVE TRANSESOPHAGEAL ECHOCARDIOGRAM;  Surgeon: Rexene Alberts, MD;  Location: [place];  Service: Open Heart Surgery;  Laterality: N/A;  . LEFT HEART CATH AND CORONARY ANGIOGRAPHY N/A 08/20/2016   Procedure: Left Heart Cath and Coronary Angiography;  Surgeon: Sherren Mocha, MD;  Location: [place] CV LAB;  Service: Cardiovascular;  Laterality: N/A;  . LEFT HEART CATH AND CORONARY ANGIOGRAPHY N/A 10/29/2016   Procedure: Left Heart Cath and Coronary Angiography;  Surgeon: Lorretta Harp, MD;  Location: [place] [facility];  Service: Cardiovascular;  Laterality: N/A;  . LEFT HEART CATHETERIZATION WITH CORONARY ANGIOGRAM N/A 11/05/2013   Procedure: LEFT HEART CATHETERIZATION WITH CORONARY ANGIOGRAM;  Surgeon: Lorretta Harp, MD;  Location[facility] CATH LAB;  Service: Cardiovascular;  Laterality: N/A;  . 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 PERCUTANEOUS CORONARY STENT INTERVENTION (PCI-S)  1999   RCA  . PROSTATE SURGERY     TURP  . UPPER GI ENDOSCOPY  02/25/2013   Schatzki ring, hiatal hernia. Presbyesophagus.  Marland Kitchen VIDEO ASSISTED THORACOSCOPY (VATS)/EMPYEMA Right 07/13/1999   drainage of post-operative empyema following RLLobectomy    Family History  Problem Relation Age of Onset  . Heart attack Mother   . Heart attack Father   . Cancer Brother        esophagus and lung  . Cancer Brother        lung  . Colon cancer Neg Hx     Social History   Tobacco Use  .  Smoking status: Former Smoker    Packs/day: 1.00    Years: 30.00    Pack years: 30.00    Last attempt to quit: 03/03/1979    Years since quitting: 38.5  . Smokeless tobacco: Current User    Types: Chew  . Tobacco comment: chews tobacco  Substance Use Topics  . Alcohol use: No  . Drug use: No    Current Outpatient Medications  Medication Sig Dispense Refill  . albuterol (PROVENTIL HFA;VENTOLIN HFA) 108 (90 BASE) MCG/ACT inhaler Inhale 2 puffs into the lungs every 6 (six) hours as needed for wheezing or shortness of breath.    Marland Kitchen amLODipine (NORVASC) 5 MG tablet Take 1 tablet (5 mg total) by mouth daily. 30 tablet  3  . aspirin 81 MG tablet Take 1 tablet (81 mg total) by mouth at bedtime. 30 tablet 0  . DULERA 100-5 MCG/ACT AERO INHALE 2 PUFFS 2 TIMES PER DAY IN THE MORNING AND EVENING  5  . furosemide (LASIX) 20 MG tablet Take 20 mg by mouth every other day. TFSS    . furosemide (LASIX) 40 MG tablet Take 40 mg by mouth every other day. MWF    . ipratropium-albuterol (DUONEB) 0.5-2.5 (3) MG/3ML SOLN Take 3 mLs by nebulization 4 (four) times daily.     Marland Kitchen levothyroxine (SYNTHROID, LEVOTHROID) 75 MCG tablet Take 75 mcg by mouth daily before breakfast.    . lisinopril (PRINIVIL,ZESTRIL) 20 MG tablet Take 1 tablet (20 mg total) by mouth daily. 30 tablet 3  . metoprolol tartrate (LOPRESSOR) 25 MG tablet TAKE 1/2 TABLET BY MOUTH TWICE A DAY 30 tablet 5  . montelukast (SINGULAIR) 10 MG tablet Take 10 mg by mouth at bedtime.    . pantoprazole (PROTONIX) 40 MG tablet Take 1 tablet (40 mg total) by mouth 2 (two) times daily. 60 tablet 1  . predniSONE (DELTASONE) 10 MG tablet Take 10 mg by mouth daily with breakfast. Taking steroid taper 40 mg for one week 30 mg x one week, 20 mg for a week then return to 10 mg daily     No current facility-administered medications for this visit.     Allergies  Allergen Reactions  . Demerol [Meperidine] Anaphylaxis  . Statins Other (See Comments)    Myalgia      Review of Systems:  Constitutional: Denies fever, chills, diaphoresis, appetite change and fatigue.  HEENT: Denies photophobia, eye pain, redness, hearing loss, ear pain, congestion, sore throat, rhinorrhea, sneezing, mouth sores, neck pain, neck stiffness and tinnitus.   Respiratory: Denies SOB, DOE, cough, chest tightness,  and wheezing.   Cardiovascular: Denies chest pain, palpitations and leg swelling.  Genitourinary: Denies dysuria, urgency, frequency, hematuria, flank pain and difficulty urinating.  Musculoskeletal: Denies myalgias, back pain, joint swelling, arthralgias and gait problem.  Skin: No rash.  Neurological: Denies dizziness, seizures, syncope, weakness, light-headedness, numbness and headaches.  Hematological: Denies adenopathy. Easy bruising, personal or family bleeding history  Psychiatric/Behavioral: No anxiety or depression     Physical Exam:    BP (!) 146/72   Pulse 64   Ht 5\' 8"  (1.727 m)   Wt 136 lb (61.7 kg)   BMI 20.68 kg/m  Filed Weights   09/17/17 1406  Weight: 136 lb (61.7 kg)   Constitutional:  Well-developed, in no acute distress. Psychiatric: Normal mood and affect. Behavior is normal. HEENT: Pupils normal.  Conjunctivae are normal. No scleral icterus. Neck supple.  Cardiovascular: Normal rate, regular rhythm. No edema Pulmonary/chest: Bilateral decreased breath sounds. Abdominal: Soft, nondistended. Nontender. Bowel sounds active throughout. There are no masses palpable. No hepatomegaly. Rectal:  defered Neurological: Alert and oriented to person place and time. Skin: Skin is warm and dry. No rashes noted.  Data Reviewed: I have personally reviewed following labs and imaging studies  CBC: CBC Latest Ref Rng & Units 10/27/2016 08/24/2016 08/24/2016  WBC 3.4 - 10.8 x10E3/uL 13.3(H) 4.8 6.5  Hemoglobin 13.0 - 17.7 g/dL 8.9(L) 10.0(L) 10.3(L)  Hematocrit 37.5 - 51.0 % 29.6(L) 31.2(L) 33.4(L)  Platelets 150 - 379 x10E3/uL 212 174 177     CMP: CMP Latest Ref Rng & Units 10/27/2016 08/25/2016 08/24/2016  Glucose 65 - 99 mg/dL 167(H) 91 71  BUN 8 - 27 mg/dL 40(H) 20 21(H)  Creatinine 0.76 - 1.27 mg/dL 1.63(H) 1.57(H) 1.32(H)  Sodium 134 - 144 mmol/L 143 142 142  Potassium 3.5 - 5.2 mmol/L 4.2 4.3 4.2  Chloride 96 - 106 mmol/L 105 110 113(H)  CO2 20 - 29 mmol/L 22 26 26   Calcium 8.6 - 10.2 mg/dL 9.5 9.1 9.0  Total Protein 6.5 - 8.1 g/dL - 5.2(L) 4.6(L)  Total Bilirubin 0.3 - 1.2 mg/dL - 0.6 0.6  Alkaline Phos 38 - 126 U/L - 50 44  AST 15 - 41 U/L - 24 19  ALT 17 - 63 U/L - 18 16(L)     Carmell Austria, MD   Cc: Townsend Roger, MD

## 2017-09-17 NOTE — Patient Instructions (Signed)
If you are age 82 or older, your body mass index should be between 23-30. Your Body mass index is 20.68 kg/m. If this is out of the aforementioned range listed, please consider follow up with your Primary Care Provider.  If you are age 82 or younger, your body mass index should be between 19-25. Your Body mass index is 20.68 kg/m. If this is out of the aformentioned range listed, please consider follow up with your Primary Care Provider.   Thank you,  Dr. Jackquline Denmark

## 2017-09-20 DIAGNOSIS — T8130XA Disruption of wound, unspecified, initial encounter: Secondary | ICD-10-CM | POA: Diagnosis not present

## 2017-09-24 DIAGNOSIS — I10 Essential (primary) hypertension: Secondary | ICD-10-CM | POA: Diagnosis not present

## 2017-09-24 DIAGNOSIS — M810 Age-related osteoporosis without current pathological fracture: Secondary | ICD-10-CM | POA: Diagnosis not present

## 2017-09-24 DIAGNOSIS — H109 Unspecified conjunctivitis: Secondary | ICD-10-CM | POA: Diagnosis not present

## 2017-09-24 DIAGNOSIS — E785 Hyperlipidemia, unspecified: Secondary | ICD-10-CM | POA: Diagnosis not present

## 2017-09-24 DIAGNOSIS — J449 Chronic obstructive pulmonary disease, unspecified: Secondary | ICD-10-CM | POA: Diagnosis not present

## 2017-09-27 DIAGNOSIS — T8130XA Disruption of wound, unspecified, initial encounter: Secondary | ICD-10-CM | POA: Diagnosis not present

## 2017-10-18 DIAGNOSIS — T8130XA Disruption of wound, unspecified, initial encounter: Secondary | ICD-10-CM | POA: Diagnosis not present

## 2017-10-25 DIAGNOSIS — T8130XA Disruption of wound, unspecified, initial encounter: Secondary | ICD-10-CM | POA: Diagnosis not present

## 2017-10-25 DIAGNOSIS — H1830 Unspecified corneal membrane change: Secondary | ICD-10-CM | POA: Diagnosis not present

## 2017-11-05 DIAGNOSIS — C3490 Malignant neoplasm of unspecified part of unspecified bronchus or lung: Secondary | ICD-10-CM | POA: Diagnosis not present

## 2017-11-05 DIAGNOSIS — C3412 Malignant neoplasm of upper lobe, left bronchus or lung: Secondary | ICD-10-CM | POA: Diagnosis not present

## 2017-11-05 DIAGNOSIS — N200 Calculus of kidney: Secondary | ICD-10-CM | POA: Diagnosis not present

## 2017-11-05 DIAGNOSIS — J61 Pneumoconiosis due to asbestos and other mineral fibers: Secondary | ICD-10-CM | POA: Diagnosis not present

## 2017-11-05 DIAGNOSIS — I700 Atherosclerosis of aorta: Secondary | ICD-10-CM | POA: Diagnosis not present

## 2017-11-05 DIAGNOSIS — D649 Anemia, unspecified: Secondary | ICD-10-CM | POA: Diagnosis not present

## 2017-11-06 DIAGNOSIS — Z85118 Personal history of other malignant neoplasm of bronchus and lung: Secondary | ICD-10-CM | POA: Diagnosis not present

## 2017-11-06 DIAGNOSIS — Z77090 Contact with and (suspected) exposure to asbestos: Secondary | ICD-10-CM | POA: Diagnosis not present

## 2017-11-06 DIAGNOSIS — C3412 Malignant neoplasm of upper lobe, left bronchus or lung: Secondary | ICD-10-CM | POA: Diagnosis not present

## 2017-11-08 DIAGNOSIS — H1830 Unspecified corneal membrane change: Secondary | ICD-10-CM | POA: Diagnosis not present

## 2017-11-15 ENCOUNTER — Ambulatory Visit (INDEPENDENT_AMBULATORY_CARE_PROVIDER_SITE_OTHER): Payer: Medicare Other | Admitting: Cardiovascular Disease

## 2017-11-15 ENCOUNTER — Encounter: Payer: Self-pay | Admitting: Cardiovascular Disease

## 2017-11-15 VITALS — BP 134/60 | HR 58 | Ht 68.0 in | Wt 134.0 lb

## 2017-11-15 DIAGNOSIS — I2510 Atherosclerotic heart disease of native coronary artery without angina pectoris: Secondary | ICD-10-CM

## 2017-11-15 DIAGNOSIS — I25118 Atherosclerotic heart disease of native coronary artery with other forms of angina pectoris: Secondary | ICD-10-CM | POA: Diagnosis not present

## 2017-11-15 DIAGNOSIS — I10 Essential (primary) hypertension: Secondary | ICD-10-CM

## 2017-11-15 DIAGNOSIS — I6523 Occlusion and stenosis of bilateral carotid arteries: Secondary | ICD-10-CM

## 2017-11-15 NOTE — Assessment & Plan Note (Signed)
History of carotid artery disease status post remote left carotid endarterectomy at [facility] 7 or 8 years ago.  His most recent carotid Dopplers performed 01/26/2016 revealed moderate right ICA stenosis and mild to moderate left ICA stenosis.  He does complain of an audible bruit and is here when he lies down at night and has bilateral bruits on exam today.  I am going to repeat carotid Doppler studies although he does not wish to have any invasive revascularization procedures at this time.

## 2017-11-15 NOTE — Assessment & Plan Note (Signed)
History of CAD status post stenting in 1999 and again in 2004 by Dr. Maurene Capes he underwent outpatient cardiac catheterization 11/05/2013 via the right radial approach demonstrating 50% left main with high-grade proximal LAD, ostial circumflex and moderate mid RCA disease with normal LV function.  Based on this, I recommend a coronary artery bypass grafting which was performed by Dr. Roxy Manns 11/20/2013 with a LIMA to his LAD, vein to the circumflex.  Because of a pulmonary nodule he underwent Tru-Cut needle biopsy of his left upper lobe which turned out to be squamous cell carcinoma for which he received radiation therapy.  He underwent repeat cardiac catheterization 08/20/2016 by Dr. Burt Knack and had stenting of his RCA.  He is been asymptomatic from a cardiac point of view since.

## 2017-11-15 NOTE — Patient Instructions (Signed)
Medication Instructions: Your physician recommends that you continue on your current medications as directed. Please refer to the Current Medication list given to you today.   Testing/Procedures: Your physician has requested that you have a carotid duplex. This test is an ultrasound of the carotid arteries in your neck. It looks at blood flow through these arteries that supply the brain with blood. Allow one hour for this exam. There are no restrictions or special instructions.  Follow-Up: We request that you follow-up in: 6 months with an extender and in 12 months with Dr Andria Rhein will receive a reminder letter in the mail two months in advance. If you don't receive a letter, please call our office to schedule the follow-up appointment.  If you need a refill on your cardiac medications before your next appointment, please call your pharmacy.

## 2017-11-15 NOTE — Progress Notes (Signed)
11/15/2017 Glenn Reilly   [birth year]  [id number]  Primary Physician Townsend Roger, MD Primary Cardiologist: Lorretta Harp MD Lupe Carney, [place]  HPI:  Glenn Reilly is a 84 y.o.  thin appearing widowed Caucasian male father of one son who accompanies him today. I last saw him in the office  04/02/2017.Marland Kitchen He was referred by Dr. Vassie Loll [facility] cardiac risk factor profile is remarkable for remote tobacco abuse, hyperlipidemia and hypertension. He has never had a heart attack but did have a stroke in 2011 at the time of left carotid endarterectomy. He said coronary artery disease with stenting in 1999 performed by Dr. Eustace Quail and again 2004. His left calf because k patent stent with moderate LAD and circumflex disease. He does have diastolic dysfunction. He has had lung cancer and right lung resection by Dr. Baldemar Friday in 2001. On Thursday night after mowing his lawn on a riding lower developed back pain rating to his chest associated with dyspnea on exertion. This resolved throughout the night has not recurred. He did have apparently inferior T-wave inversion on EKG today which was new. A Myoview stress test was normal as was a 2-D echo echocardiogram. Lower extremity arterial Doppler studies showed ankle-brachial indices of greater than one bilaterally with a suggestion of mild to moderate iliac disease. He developed exertional left upper extremity discomfort with dyspnea over the last month which is fairly reproducible and similar to his prior symptoms before coronary intervention. Based on this, I performed outpatient cardiac catheterization on 11/05/13 in the right radial approach. I demonstrated 50% left main, high-grade proximal LAD, ostial circumflex and moderate mid RCA disease with normal LV function. Based on this, I suggested coronary artery bypass grafting is the best revascularization strategy especially in light of his pulmonary nodule which could potentially be  biopsied at the time of surgery. This was performed by Dr. Roxy Manns on 11/20/13 with a LIMA to his LAD and a vein to the circumflex coronary artery. He also had a Tru-Cut needle biopsy of his left upper lobe nodule which turned out to be some squamous cell carcinoma. He has received radiation therapy for this. He did participate in cardiac rehabilitation. A chest CT showed diminished size of his nodule. He was hospitalized for pneumonia and was told he had "congestive heart failure. A BNP was mildly elevated. He did have lower extremity edema and his Lasix was increased. A subsequent 2-D echo performed in March of last year showed preserved LV function with diastolic dysfunction. He saw Almyra Deforest PACin the office recently complaining of chest pain and a Myoview stress test was performed which showed normal LV systolic function with normal perfusion. His chest pain has subsequently resolved. He was hospitalized at [facility] last month with pneumonia and flu. He had some increased lotion edema which responded to adjusting his diuretic dose. Since I saw him in the office a year ago he did develop left upper extremity discomfort similar to his previous coronary symptoms and saw Almyra Deforest in the office on 08/15/16 range for him to undergo outpatient cardiac cath by Dr. Burt Knack 08/20/16. He was found to have an 80% "in-stent restenosis" within the previously placed RCA stent which was restented. He was discharged on the same day. His symptoms resolved after that however several weeks later he had recurrent symptoms similar to his pre-stent symptoms with chest pain, shortness of breath and left arm pain. These have become progressively worse.  Because of  recurrent chest pain I performed cardiac catheterization on him 10/29/16 revealing unchanged anatomy compared to his previous. He has had no chest pain since I saw him. He does complain of dyspnea and lower extremity edema probably related to diastolic dysfunction on  diuretics (Lasix 20 mg alternating with 40 mg). Since I saw him in the office approximately 8 months ago he is done remarkably well.  He does live at home and his son lives with him.  He is fairly independent.  He denies chest pain or shortness of breath.  His only complaint is of a swishing sensation in his right ear when he lies down at night.  Does have a history of left carotid endarterectomy 7 or 8 years ago at [facility] and we follow his carotids on an annual basis which have been done several years ago revealing moderate right ICA stenosis and mild to moderate left.     Current Meds  Medication Sig  . albuterol (PROVENTIL HFA;VENTOLIN HFA) 108 (90 BASE) MCG/ACT inhaler Inhale 2 puffs into the lungs every 6 (six) hours as needed for wheezing or shortness of breath.  Marland Kitchen amLODipine (NORVASC) 5 MG tablet Take 1 tablet (5 mg total) by mouth daily.  Marland Kitchen aspirin 81 MG tablet Take 1 tablet (81 mg total) by mouth at bedtime.  . DULERA 100-5 MCG/ACT AERO INHALE 2 PUFFS 2 TIMES PER DAY IN THE MORNING AND EVENING  . furosemide (LASIX) 20 MG tablet Take 20 mg by mouth every other day. TFSS  . furosemide (LASIX) 40 MG tablet Take 40 mg by mouth every other day. MWF  . Hypromellose (ARTIFICIAL TEARS OP) Place 1 drop into both eyes 2 (two) times daily.  Marland Kitchen ipratropium-albuterol (DUONEB) 0.5-2.5 (3) MG/3ML SOLN Take 3 mLs by nebulization 4 (four) times daily.   Marland Kitchen levothyroxine (SYNTHROID, LEVOTHROID) 75 MCG tablet Take 75 mcg by mouth daily before breakfast.  . lisinopril (PRINIVIL,ZESTRIL) 20 MG tablet Take 1 tablet (20 mg total) by mouth daily.  . metoprolol tartrate (LOPRESSOR) 25 MG tablet TAKE 1/2 TABLET BY MOUTH TWICE A DAY  . montelukast (SINGULAIR) 10 MG tablet Take 10 mg by mouth at bedtime.  . pantoprazole (PROTONIX) 40 MG tablet Take 1 tablet (40 mg total) by mouth 2 (two) times daily.  . prednisoLONE acetate (PRED FORTE) 1 % ophthalmic suspension Place 1 drop into the left eye  2 (two) times daily.   . predniSONE (DELTASONE) 10 MG tablet Take 10 mg by mouth daily with breakfast. Taking steroid taper 40 mg for one week 30 mg x one week, 20 mg for a week then return to 10 mg daily     Allergies  Allergen Reactions  . Demerol [Meperidine] Anaphylaxis  . Statins Other (See Comments)    Myalgia     Social History   Socioeconomic History  . Marital status: Widowed    Spouse name: Not on file  . Number of children: 1  . Years of education: Not on file  . Highest education level: Not on file  Occupational History  . 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 Financial resource strain: Not on file  . Food insecurity:    Worry: Not on file    Inability: Not on file  . Transportation needs:    Medical: Not on file    Non-medical: Not on file  Tobacco Use  . Smoking status: Former Smoker    Packs/day: 1.00  Years: 30.00    Pack years: 30.00    Last attempt to quit: 03/03/1979    Years since quitting: 38.7  . Smokeless tobacco: Current User    Types: Chew  . Tobacco comment: chews tobacco  Substance and Sexual Activity  . Alcohol use: No  . Drug use: No  . Sexual activity: Not on file  Lifestyle  . Physical activity:    Days per week: Not on file    Minutes per session: Not on file  . Stress: Not on file  Relationships  . Social connections:    Talks on phone: Not on file    Gets together: Not on file    Attends religious service: Not on file    Active member of club or organization: Not on file    Attends meetings of clubs or organizations: Not on file    Relationship status: Not on file  . Intimate partner violence:    Fear of current or ex partner: Not on file    Emotionally abused: Not on file    Physically abused: Not on file    Forced sexual activity: Not on file  Other Topics Concern  . Not on file  Social History Narrative  . Not on file     Review of Systems: General:  negative for chills, fever, night sweats or weight changes.  Cardiovascular: negative for chest pain, dyspnea on exertion, edema, orthopnea, palpitations, paroxysmal nocturnal dyspnea or shortness of breath Dermatological: negative for rash Respiratory: negative for cough or wheezing Urologic: negative for hematuria Abdominal: negative for nausea, vomiting, diarrhea, bright red blood per rectum, melena, or hematemesis Neurologic: negative for visual changes, syncope, or dizziness All other systems reviewed and are otherwise negative except as noted above.    Blood pressure 134/60, pulse (!) 58, height 5\' 8"  (1.727 m), weight 134 lb (60.8 kg).  General appearance: alert and no distress Neck: no adenopathy, no JVD, supple, symmetrical, trachea midline, thyroid not enlarged, symmetric, no tenderness/mass/nodules and Soft bilateral carotid bruits Lungs: clear to auscultation bilaterally Heart: Soft outflow tract murmur Extremities: extremities normal, atraumatic, no cyanosis or edema Pulses: 2+ and symmetric Skin: Skin color, texture, turgor normal. No rashes or lesions Neurologic: Alert and oriented X 3, normal strength and tone. Normal symmetric reflexes. Normal coordination and gait  EKG sinus bradycardia 58 with right bundle branch block this EKG.  ASSESSMENT AND PLAN:   HYPERLIPIDEMIA-MIXED History of hyperlipidemia not on statin therapy because of statin intolerance followed by his PCP  HYPERTENSION, BENIGN History of essential hypertension blood pressure measured today 134/60.  He is on amlodipine, lisinopril and metoprolol.  Continue current meds at current dosing.  CAD, NATIVE VESSEL History of CAD status post stenting in 1999 and again in 2004 by Dr. Maurene Capes he underwent outpatient cardiac catheterization 11/05/2013 via the right radial approach demonstrating 50% left main with high-grade proximal LAD, ostial circumflex and moderate mid RCA disease with normal LV function.  Based  on this, I recommend a coronary artery bypass grafting which was performed by Dr. Roxy Manns 11/20/2013 with a LIMA to his LAD, vein to the circumflex.  Because of a pulmonary nodule he underwent Tru-Cut needle biopsy of his left upper lobe which turned out to be squamous cell carcinoma for which he received radiation therapy.  He underwent repeat cardiac catheterization 08/20/2016 by Dr. Burt Knack and had stenting of his RCA.  He is been asymptomatic from a cardiac point of view since.  Carotid artery disease History of carotid  artery disease status post remote left carotid endarterectomy at [facility] 7 or 8 years ago.  His most recent carotid Dopplers performed 01/26/2016 revealed moderate right ICA stenosis and mild to moderate left ICA stenosis.  He does complain of an audible bruit and is here when he lies down at night and has bilateral bruits on exam today.  I am going to repeat carotid Doppler studies although he does not wish to have any invasive revascularization procedures at this time.      Lorretta Harp MD FACP,FACC,FAHA, FSCAI 11/15/2017 10:14 AM

## 2017-11-15 NOTE — Assessment & Plan Note (Signed)
History of hyperlipidemia not on statin therapy because of statin intolerance followed by his PCP

## 2017-11-15 NOTE — Assessment & Plan Note (Signed)
History of essential hypertension blood pressure measured today 134/60.  He is on amlodipine, lisinopril and metoprolol.  Continue current meds at current dosing.

## 2017-11-20 ENCOUNTER — Ambulatory Visit (HOSPITAL_COMMUNITY)
Admission: RE | Admit: 2017-11-20 | Discharge: 2017-11-20 | Disposition: A | Discharge status: Home / Self Care | Payer: Medicare Other | Source: Ambulatory Visit | Attending: Cardiology | Admitting: Cardiology

## 2017-11-20 DIAGNOSIS — I6523 Occlusion and stenosis of bilateral carotid arteries: Secondary | ICD-10-CM | POA: Insufficient documentation

## 2017-11-22 ENCOUNTER — Other Ambulatory Visit: Payer: Self-pay | Admitting: *Deleted

## 2017-11-22 DIAGNOSIS — I6523 Occlusion and stenosis of bilateral carotid arteries: Secondary | ICD-10-CM

## 2017-11-29 DIAGNOSIS — C3412 Malignant neoplasm of upper lobe, left bronchus or lung: Secondary | ICD-10-CM | POA: Diagnosis not present

## 2017-12-05 DIAGNOSIS — G894 Chronic pain syndrome: Secondary | ICD-10-CM | POA: Diagnosis not present

## 2017-12-05 DIAGNOSIS — G8929 Other chronic pain: Secondary | ICD-10-CM | POA: Diagnosis not present

## 2017-12-05 DIAGNOSIS — M7062 Trochanteric bursitis, left hip: Secondary | ICD-10-CM | POA: Diagnosis not present

## 2017-12-05 DIAGNOSIS — M545 Low back pain: Secondary | ICD-10-CM | POA: Diagnosis not present

## 2017-12-05 DIAGNOSIS — M9983 Other biomechanical lesions of lumbar region: Secondary | ICD-10-CM | POA: Diagnosis not present

## 2017-12-05 DIAGNOSIS — M4800 Spinal stenosis, site unspecified: Secondary | ICD-10-CM | POA: Diagnosis not present

## 2017-12-05 DIAGNOSIS — M5136 Other intervertebral disc degeneration, lumbar region: Secondary | ICD-10-CM | POA: Diagnosis not present

## 2017-12-10 DIAGNOSIS — C44622 Squamous cell carcinoma of skin of right upper limb, including shoulder: Secondary | ICD-10-CM | POA: Diagnosis not present

## 2017-12-10 DIAGNOSIS — L570 Actinic keratosis: Secondary | ICD-10-CM | POA: Diagnosis not present

## 2017-12-16 DIAGNOSIS — H04122 Dry eye syndrome of left lacrimal gland: Secondary | ICD-10-CM | POA: Diagnosis not present

## 2017-12-16 DIAGNOSIS — Z947 Corneal transplant status: Secondary | ICD-10-CM | POA: Diagnosis not present

## 2017-12-16 DIAGNOSIS — H179 Unspecified corneal scar and opacity: Secondary | ICD-10-CM | POA: Diagnosis not present

## 2017-12-19 DIAGNOSIS — M7062 Trochanteric bursitis, left hip: Secondary | ICD-10-CM | POA: Diagnosis not present

## 2017-12-19 DIAGNOSIS — G8929 Other chronic pain: Secondary | ICD-10-CM | POA: Diagnosis not present

## 2017-12-19 DIAGNOSIS — M4800 Spinal stenosis, site unspecified: Secondary | ICD-10-CM | POA: Diagnosis not present

## 2017-12-19 DIAGNOSIS — M9983 Other biomechanical lesions of lumbar region: Secondary | ICD-10-CM | POA: Diagnosis not present

## 2017-12-19 DIAGNOSIS — M5136 Other intervertebral disc degeneration, lumbar region: Secondary | ICD-10-CM | POA: Diagnosis not present

## 2017-12-19 DIAGNOSIS — M545 Low back pain: Secondary | ICD-10-CM | POA: Diagnosis not present

## 2017-12-19 DIAGNOSIS — G894 Chronic pain syndrome: Secondary | ICD-10-CM | POA: Diagnosis not present

## 2017-12-22 DIAGNOSIS — H6122 Impacted cerumen, left ear: Secondary | ICD-10-CM | POA: Diagnosis not present

## 2017-12-22 DIAGNOSIS — H6121 Impacted cerumen, right ear: Secondary | ICD-10-CM | POA: Diagnosis not present

## 2017-12-26 DIAGNOSIS — F411 Generalized anxiety disorder: Secondary | ICD-10-CM | POA: Diagnosis not present

## 2017-12-26 DIAGNOSIS — J449 Chronic obstructive pulmonary disease, unspecified: Secondary | ICD-10-CM | POA: Diagnosis not present

## 2017-12-27 DIAGNOSIS — M810 Age-related osteoporosis without current pathological fracture: Secondary | ICD-10-CM | POA: Diagnosis not present

## 2017-12-27 DIAGNOSIS — C3412 Malignant neoplasm of upper lobe, left bronchus or lung: Secondary | ICD-10-CM | POA: Diagnosis not present

## 2018-01-10 DIAGNOSIS — I10 Essential (primary) hypertension: Secondary | ICD-10-CM | POA: Diagnosis not present

## 2018-01-10 DIAGNOSIS — E785 Hyperlipidemia, unspecified: Secondary | ICD-10-CM | POA: Diagnosis not present

## 2018-01-10 DIAGNOSIS — F17290 Nicotine dependence, other tobacco product, uncomplicated: Secondary | ICD-10-CM | POA: Diagnosis not present

## 2018-01-10 DIAGNOSIS — I2510 Atherosclerotic heart disease of native coronary artery without angina pectoris: Secondary | ICD-10-CM | POA: Diagnosis not present

## 2018-01-10 DIAGNOSIS — D682 Hereditary deficiency of other clotting factors: Secondary | ICD-10-CM | POA: Diagnosis not present

## 2018-01-10 DIAGNOSIS — K219 Gastro-esophageal reflux disease without esophagitis: Secondary | ICD-10-CM | POA: Diagnosis not present

## 2018-01-10 DIAGNOSIS — E039 Hypothyroidism, unspecified: Secondary | ICD-10-CM | POA: Diagnosis not present

## 2018-01-10 DIAGNOSIS — Z7982 Long term (current) use of aspirin: Secondary | ICD-10-CM | POA: Diagnosis not present

## 2018-01-10 DIAGNOSIS — Z7951 Long term (current) use of inhaled steroids: Secondary | ICD-10-CM | POA: Diagnosis not present

## 2018-01-10 DIAGNOSIS — Z79899 Other long term (current) drug therapy: Secondary | ICD-10-CM | POA: Diagnosis not present

## 2018-01-10 DIAGNOSIS — M545 Low back pain: Secondary | ICD-10-CM | POA: Diagnosis not present

## 2018-01-10 DIAGNOSIS — M48061 Spinal stenosis, lumbar region without neurogenic claudication: Secondary | ICD-10-CM | POA: Diagnosis not present

## 2018-01-10 DIAGNOSIS — Z955 Presence of coronary angioplasty implant and graft: Secondary | ICD-10-CM | POA: Diagnosis not present

## 2018-01-10 DIAGNOSIS — J449 Chronic obstructive pulmonary disease, unspecified: Secondary | ICD-10-CM | POA: Diagnosis not present

## 2018-01-10 DIAGNOSIS — M5136 Other intervertebral disc degeneration, lumbar region: Secondary | ICD-10-CM | POA: Diagnosis not present

## 2018-01-10 DIAGNOSIS — Z7952 Long term (current) use of systemic steroids: Secondary | ICD-10-CM | POA: Diagnosis not present

## 2018-01-10 DIAGNOSIS — G894 Chronic pain syndrome: Secondary | ICD-10-CM | POA: Diagnosis not present

## 2018-01-10 DIAGNOSIS — Z885 Allergy status to narcotic agent status: Secondary | ICD-10-CM | POA: Diagnosis not present

## 2018-01-24 DIAGNOSIS — M9983 Other biomechanical lesions of lumbar region: Secondary | ICD-10-CM | POA: Diagnosis not present

## 2018-01-24 DIAGNOSIS — M545 Low back pain: Secondary | ICD-10-CM | POA: Diagnosis not present

## 2018-01-24 DIAGNOSIS — G8929 Other chronic pain: Secondary | ICD-10-CM | POA: Diagnosis not present

## 2018-01-24 DIAGNOSIS — M4800 Spinal stenosis, site unspecified: Secondary | ICD-10-CM | POA: Diagnosis not present

## 2018-01-24 DIAGNOSIS — M5136 Other intervertebral disc degeneration, lumbar region: Secondary | ICD-10-CM | POA: Diagnosis not present

## 2018-01-24 DIAGNOSIS — J479 Bronchiectasis, uncomplicated: Secondary | ICD-10-CM | POA: Diagnosis not present

## 2018-01-24 DIAGNOSIS — C3412 Malignant neoplasm of upper lobe, left bronchus or lung: Secondary | ICD-10-CM | POA: Diagnosis not present

## 2018-01-24 DIAGNOSIS — M7062 Trochanteric bursitis, left hip: Secondary | ICD-10-CM | POA: Diagnosis not present

## 2018-01-24 DIAGNOSIS — J4530 Mild persistent asthma, uncomplicated: Secondary | ICD-10-CM | POA: Diagnosis not present

## 2018-01-24 DIAGNOSIS — G894 Chronic pain syndrome: Secondary | ICD-10-CM | POA: Diagnosis not present

## 2018-01-31 DIAGNOSIS — Z23 Encounter for immunization: Secondary | ICD-10-CM | POA: Diagnosis not present

## 2018-02-14 DIAGNOSIS — Z885 Allergy status to narcotic agent status: Secondary | ICD-10-CM | POA: Diagnosis not present

## 2018-02-14 DIAGNOSIS — Z888 Allergy status to other drugs, medicaments and biological substances status: Secondary | ICD-10-CM | POA: Diagnosis not present

## 2018-02-14 DIAGNOSIS — M419 Scoliosis, unspecified: Secondary | ICD-10-CM | POA: Diagnosis not present

## 2018-02-14 DIAGNOSIS — M545 Low back pain: Secondary | ICD-10-CM | POA: Diagnosis not present

## 2018-02-14 DIAGNOSIS — M48061 Spinal stenosis, lumbar region without neurogenic claudication: Secondary | ICD-10-CM | POA: Diagnosis not present

## 2018-02-14 DIAGNOSIS — G894 Chronic pain syndrome: Secondary | ICD-10-CM | POA: Diagnosis not present

## 2018-02-14 DIAGNOSIS — K219 Gastro-esophageal reflux disease without esophagitis: Secondary | ICD-10-CM | POA: Diagnosis not present

## 2018-02-14 DIAGNOSIS — E039 Hypothyroidism, unspecified: Secondary | ICD-10-CM | POA: Diagnosis not present

## 2018-02-14 DIAGNOSIS — Z7982 Long term (current) use of aspirin: Secondary | ICD-10-CM | POA: Diagnosis not present

## 2018-02-14 DIAGNOSIS — M5136 Other intervertebral disc degeneration, lumbar region: Secondary | ICD-10-CM | POA: Diagnosis not present

## 2018-02-14 DIAGNOSIS — I10 Essential (primary) hypertension: Secondary | ICD-10-CM | POA: Diagnosis not present

## 2018-02-14 DIAGNOSIS — M7062 Trochanteric bursitis, left hip: Secondary | ICD-10-CM | POA: Diagnosis not present

## 2018-02-14 DIAGNOSIS — I6529 Occlusion and stenosis of unspecified carotid artery: Secondary | ICD-10-CM | POA: Diagnosis not present

## 2018-02-14 DIAGNOSIS — J449 Chronic obstructive pulmonary disease, unspecified: Secondary | ICD-10-CM | POA: Diagnosis not present

## 2018-02-14 DIAGNOSIS — Z8673 Personal history of transient ischemic attack (TIA), and cerebral infarction without residual deficits: Secondary | ICD-10-CM | POA: Diagnosis not present

## 2018-02-14 DIAGNOSIS — Z79899 Other long term (current) drug therapy: Secondary | ICD-10-CM | POA: Diagnosis not present

## 2018-02-14 DIAGNOSIS — Z955 Presence of coronary angioplasty implant and graft: Secondary | ICD-10-CM | POA: Diagnosis not present

## 2018-02-14 DIAGNOSIS — Z87891 Personal history of nicotine dependence: Secondary | ICD-10-CM | POA: Diagnosis not present

## 2018-02-17 DIAGNOSIS — Z947 Corneal transplant status: Secondary | ICD-10-CM | POA: Diagnosis not present

## 2018-02-18 DIAGNOSIS — M549 Dorsalgia, unspecified: Secondary | ICD-10-CM | POA: Diagnosis not present

## 2018-02-18 DIAGNOSIS — Z85118 Personal history of other malignant neoplasm of bronchus and lung: Secondary | ICD-10-CM | POA: Diagnosis not present

## 2018-02-18 DIAGNOSIS — J61 Pneumoconiosis due to asbestos and other mineral fibers: Secondary | ICD-10-CM | POA: Diagnosis not present

## 2018-02-18 DIAGNOSIS — C3412 Malignant neoplasm of upper lobe, left bronchus or lung: Secondary | ICD-10-CM | POA: Diagnosis not present

## 2018-02-18 DIAGNOSIS — Z77090 Contact with and (suspected) exposure to asbestos: Secondary | ICD-10-CM | POA: Diagnosis not present

## 2018-02-24 DIAGNOSIS — R531 Weakness: Secondary | ICD-10-CM | POA: Diagnosis not present

## 2018-02-24 DIAGNOSIS — E860 Dehydration: Secondary | ICD-10-CM | POA: Diagnosis not present

## 2018-02-27 DIAGNOSIS — K922 Gastrointestinal hemorrhage, unspecified: Secondary | ICD-10-CM | POA: Diagnosis not present

## 2018-02-27 DIAGNOSIS — Z951 Presence of aortocoronary bypass graft: Secondary | ICD-10-CM | POA: Diagnosis not present

## 2018-02-27 DIAGNOSIS — J61 Pneumoconiosis due to asbestos and other mineral fibers: Secondary | ICD-10-CM | POA: Diagnosis not present

## 2018-02-27 DIAGNOSIS — J449 Chronic obstructive pulmonary disease, unspecified: Secondary | ICD-10-CM | POA: Diagnosis not present

## 2018-02-27 DIAGNOSIS — M1990 Unspecified osteoarthritis, unspecified site: Secondary | ICD-10-CM | POA: Diagnosis not present

## 2018-02-27 DIAGNOSIS — C3412 Malignant neoplasm of upper lobe, left bronchus or lung: Secondary | ICD-10-CM | POA: Diagnosis not present

## 2018-02-27 DIAGNOSIS — I2510 Atherosclerotic heart disease of native coronary artery without angina pectoris: Secondary | ICD-10-CM | POA: Diagnosis not present

## 2018-02-27 DIAGNOSIS — I509 Heart failure, unspecified: Secondary | ICD-10-CM | POA: Diagnosis not present

## 2018-02-27 DIAGNOSIS — I69322 Dysarthria following cerebral infarction: Secondary | ICD-10-CM | POA: Diagnosis not present

## 2018-02-27 DIAGNOSIS — Z87891 Personal history of nicotine dependence: Secondary | ICD-10-CM | POA: Diagnosis not present

## 2018-02-28 DIAGNOSIS — Z951 Presence of aortocoronary bypass graft: Secondary | ICD-10-CM | POA: Diagnosis not present

## 2018-02-28 DIAGNOSIS — J449 Chronic obstructive pulmonary disease, unspecified: Secondary | ICD-10-CM | POA: Diagnosis not present

## 2018-02-28 DIAGNOSIS — C3412 Malignant neoplasm of upper lobe, left bronchus or lung: Secondary | ICD-10-CM | POA: Diagnosis not present

## 2018-02-28 DIAGNOSIS — I2510 Atherosclerotic heart disease of native coronary artery without angina pectoris: Secondary | ICD-10-CM | POA: Diagnosis not present

## 2018-02-28 DIAGNOSIS — J61 Pneumoconiosis due to asbestos and other mineral fibers: Secondary | ICD-10-CM | POA: Diagnosis not present

## 2018-02-28 DIAGNOSIS — I509 Heart failure, unspecified: Secondary | ICD-10-CM | POA: Diagnosis not present

## 2018-03-03 DIAGNOSIS — I2510 Atherosclerotic heart disease of native coronary artery without angina pectoris: Secondary | ICD-10-CM | POA: Diagnosis not present

## 2018-03-03 DIAGNOSIS — J61 Pneumoconiosis due to asbestos and other mineral fibers: Secondary | ICD-10-CM | POA: Diagnosis not present

## 2018-03-03 DIAGNOSIS — C3412 Malignant neoplasm of upper lobe, left bronchus or lung: Secondary | ICD-10-CM | POA: Diagnosis not present

## 2018-03-03 DIAGNOSIS — I509 Heart failure, unspecified: Secondary | ICD-10-CM | POA: Diagnosis not present

## 2018-03-03 DIAGNOSIS — Z951 Presence of aortocoronary bypass graft: Secondary | ICD-10-CM | POA: Diagnosis not present

## 2018-03-03 DIAGNOSIS — J449 Chronic obstructive pulmonary disease, unspecified: Secondary | ICD-10-CM | POA: Diagnosis not present

## 2018-03-05 DIAGNOSIS — J61 Pneumoconiosis due to asbestos and other mineral fibers: Secondary | ICD-10-CM | POA: Diagnosis not present

## 2018-03-05 DIAGNOSIS — Z951 Presence of aortocoronary bypass graft: Secondary | ICD-10-CM | POA: Diagnosis not present

## 2018-03-05 DIAGNOSIS — C3412 Malignant neoplasm of upper lobe, left bronchus or lung: Secondary | ICD-10-CM | POA: Diagnosis not present

## 2018-03-05 DIAGNOSIS — J449 Chronic obstructive pulmonary disease, unspecified: Secondary | ICD-10-CM | POA: Diagnosis not present

## 2018-03-05 DIAGNOSIS — I509 Heart failure, unspecified: Secondary | ICD-10-CM | POA: Diagnosis not present

## 2018-03-05 DIAGNOSIS — I2510 Atherosclerotic heart disease of native coronary artery without angina pectoris: Secondary | ICD-10-CM | POA: Diagnosis not present

## 2018-03-07 DIAGNOSIS — I509 Heart failure, unspecified: Secondary | ICD-10-CM | POA: Diagnosis not present

## 2018-03-07 DIAGNOSIS — I2510 Atherosclerotic heart disease of native coronary artery without angina pectoris: Secondary | ICD-10-CM | POA: Diagnosis not present

## 2018-03-07 DIAGNOSIS — J449 Chronic obstructive pulmonary disease, unspecified: Secondary | ICD-10-CM | POA: Diagnosis not present

## 2018-03-07 DIAGNOSIS — J61 Pneumoconiosis due to asbestos and other mineral fibers: Secondary | ICD-10-CM | POA: Diagnosis not present

## 2018-03-07 DIAGNOSIS — C3412 Malignant neoplasm of upper lobe, left bronchus or lung: Secondary | ICD-10-CM | POA: Diagnosis not present

## 2018-03-07 DIAGNOSIS — Z951 Presence of aortocoronary bypass graft: Secondary | ICD-10-CM | POA: Diagnosis not present

## 2018-03-10 DIAGNOSIS — C3412 Malignant neoplasm of upper lobe, left bronchus or lung: Secondary | ICD-10-CM | POA: Diagnosis not present

## 2018-03-10 DIAGNOSIS — J449 Chronic obstructive pulmonary disease, unspecified: Secondary | ICD-10-CM | POA: Diagnosis not present

## 2018-03-10 DIAGNOSIS — J61 Pneumoconiosis due to asbestos and other mineral fibers: Secondary | ICD-10-CM | POA: Diagnosis not present

## 2018-03-10 DIAGNOSIS — Z951 Presence of aortocoronary bypass graft: Secondary | ICD-10-CM | POA: Diagnosis not present

## 2018-03-10 DIAGNOSIS — I2510 Atherosclerotic heart disease of native coronary artery without angina pectoris: Secondary | ICD-10-CM | POA: Diagnosis not present

## 2018-03-10 DIAGNOSIS — I509 Heart failure, unspecified: Secondary | ICD-10-CM | POA: Diagnosis not present

## 2018-03-11 DIAGNOSIS — I2510 Atherosclerotic heart disease of native coronary artery without angina pectoris: Secondary | ICD-10-CM | POA: Diagnosis not present

## 2018-03-11 DIAGNOSIS — J449 Chronic obstructive pulmonary disease, unspecified: Secondary | ICD-10-CM | POA: Diagnosis not present

## 2018-03-11 DIAGNOSIS — I509 Heart failure, unspecified: Secondary | ICD-10-CM | POA: Diagnosis not present

## 2018-03-11 DIAGNOSIS — J61 Pneumoconiosis due to asbestos and other mineral fibers: Secondary | ICD-10-CM | POA: Diagnosis not present

## 2018-03-11 DIAGNOSIS — C3412 Malignant neoplasm of upper lobe, left bronchus or lung: Secondary | ICD-10-CM | POA: Diagnosis not present

## 2018-03-11 DIAGNOSIS — Z951 Presence of aortocoronary bypass graft: Secondary | ICD-10-CM | POA: Diagnosis not present

## 2018-03-12 DIAGNOSIS — J449 Chronic obstructive pulmonary disease, unspecified: Secondary | ICD-10-CM | POA: Diagnosis not present

## 2018-03-12 DIAGNOSIS — Z951 Presence of aortocoronary bypass graft: Secondary | ICD-10-CM | POA: Diagnosis not present

## 2018-03-12 DIAGNOSIS — I509 Heart failure, unspecified: Secondary | ICD-10-CM | POA: Diagnosis not present

## 2018-03-12 DIAGNOSIS — J61 Pneumoconiosis due to asbestos and other mineral fibers: Secondary | ICD-10-CM | POA: Diagnosis not present

## 2018-03-12 DIAGNOSIS — I2510 Atherosclerotic heart disease of native coronary artery without angina pectoris: Secondary | ICD-10-CM | POA: Diagnosis not present

## 2018-03-12 DIAGNOSIS — C3412 Malignant neoplasm of upper lobe, left bronchus or lung: Secondary | ICD-10-CM | POA: Diagnosis not present

## 2018-03-13 DIAGNOSIS — Z951 Presence of aortocoronary bypass graft: Secondary | ICD-10-CM | POA: Diagnosis not present

## 2018-03-13 DIAGNOSIS — J449 Chronic obstructive pulmonary disease, unspecified: Secondary | ICD-10-CM | POA: Diagnosis not present

## 2018-03-13 DIAGNOSIS — J61 Pneumoconiosis due to asbestos and other mineral fibers: Secondary | ICD-10-CM | POA: Diagnosis not present

## 2018-03-13 DIAGNOSIS — I509 Heart failure, unspecified: Secondary | ICD-10-CM | POA: Diagnosis not present

## 2018-03-13 DIAGNOSIS — I2510 Atherosclerotic heart disease of native coronary artery without angina pectoris: Secondary | ICD-10-CM | POA: Diagnosis not present

## 2018-03-13 DIAGNOSIS — C3412 Malignant neoplasm of upper lobe, left bronchus or lung: Secondary | ICD-10-CM | POA: Diagnosis not present

## 2018-03-14 DEATH — deceased

## 2018-06-04 ENCOUNTER — Telehealth: Payer: Self-pay | Admitting: *Deleted

## 2018-06-04 NOTE — Telephone Encounter (Signed)
CALLED [facility] TO ASK ABOUT PATIENT HAVING LAST SCAN AND HE HAD LAST SCAN ON 11/05/17 @ [place]
